# Patient Record
Sex: Female | Born: 1940
Health system: Southern US, Community
[De-identification: ages and names within clinical notes are randomized; demographics above are authoritative.]

## PROBLEM LIST (undated history)

## (undated) DIAGNOSIS — N6019 Diffuse cystic mastopathy of unspecified breast: Secondary | ICD-10-CM

## (undated) DIAGNOSIS — N189 Chronic kidney disease, unspecified: Secondary | ICD-10-CM

## (undated) DIAGNOSIS — I6529 Occlusion and stenosis of unspecified carotid artery: Secondary | ICD-10-CM

## (undated) DIAGNOSIS — F419 Anxiety disorder, unspecified: Secondary | ICD-10-CM

## (undated) DIAGNOSIS — M1990 Unspecified osteoarthritis, unspecified site: Secondary | ICD-10-CM

## (undated) DIAGNOSIS — G4700 Insomnia, unspecified: Secondary | ICD-10-CM

## (undated) DIAGNOSIS — I10 Essential (primary) hypertension: Secondary | ICD-10-CM

## (undated) DIAGNOSIS — C801 Malignant (primary) neoplasm, unspecified: Secondary | ICD-10-CM

## (undated) DIAGNOSIS — E039 Hypothyroidism, unspecified: Secondary | ICD-10-CM

## (undated) DIAGNOSIS — Z7189 Other specified counseling: Secondary | ICD-10-CM

## (undated) DIAGNOSIS — M4800 Spinal stenosis, site unspecified: Secondary | ICD-10-CM

## (undated) HISTORY — DX: Diffuse cystic mastopathy of unspecified breast: N60.19

## (undated) HISTORY — PX: EYE SURGERY: SHX253

## (undated) HISTORY — DX: Insomnia, unspecified: G47.00

## (undated) HISTORY — DX: Malignant (primary) neoplasm, unspecified: C80.1

## (undated) HISTORY — DX: Spinal stenosis, site unspecified: M48.00

## (undated) HISTORY — DX: Essential (primary) hypertension: I10

## (undated) HISTORY — PX: TUBAL LIGATION: SHX77

## (undated) HISTORY — PX: MELANOMA EXCISION: SHX5266

## (undated) HISTORY — DX: Other specified counseling: Z71.89

---

## 1995-03-07 DIAGNOSIS — C801 Malignant (primary) neoplasm, unspecified: Secondary | ICD-10-CM

## 1995-03-07 HISTORY — DX: Malignant (primary) neoplasm, unspecified: C80.1

## 1997-03-06 HISTORY — PX: ABDOMINAL HYSTERECTOMY: SHX81

## 1997-07-28 ENCOUNTER — Other Ambulatory Visit: Admission: RE | Admit: 1997-07-28 | Discharge: 1997-07-28 | Payer: Self-pay | Admitting: Obstetrics and Gynecology

## 1997-11-06 ENCOUNTER — Other Ambulatory Visit: Admission: RE | Admit: 1997-11-06 | Discharge: 1997-11-06 | Payer: Self-pay | Admitting: Obstetrics and Gynecology

## 1998-04-05 ENCOUNTER — Other Ambulatory Visit: Admission: RE | Admit: 1998-04-05 | Discharge: 1998-04-05 | Payer: Self-pay | Admitting: Obstetrics and Gynecology

## 1998-08-05 ENCOUNTER — Other Ambulatory Visit: Admission: RE | Admit: 1998-08-05 | Discharge: 1998-08-05 | Payer: Self-pay | Admitting: Obstetrics and Gynecology

## 1998-09-06 ENCOUNTER — Ambulatory Visit (HOSPITAL_COMMUNITY): Admission: RE | Admit: 1998-09-06 | Discharge: 1998-09-06 | Payer: Self-pay | Admitting: Gastroenterology

## 1998-09-06 ENCOUNTER — Encounter (INDEPENDENT_AMBULATORY_CARE_PROVIDER_SITE_OTHER): Payer: Self-pay | Admitting: Specialist

## 1998-12-09 ENCOUNTER — Other Ambulatory Visit: Admission: RE | Admit: 1998-12-09 | Discharge: 1998-12-09 | Payer: Self-pay | Admitting: Obstetrics and Gynecology

## 1999-05-05 ENCOUNTER — Other Ambulatory Visit: Admission: RE | Admit: 1999-05-05 | Discharge: 1999-05-05 | Payer: Self-pay | Admitting: Obstetrics and Gynecology

## 1999-11-03 ENCOUNTER — Other Ambulatory Visit: Admission: RE | Admit: 1999-11-03 | Discharge: 1999-11-03 | Payer: Self-pay | Admitting: Obstetrics and Gynecology

## 2000-05-03 ENCOUNTER — Other Ambulatory Visit: Admission: RE | Admit: 2000-05-03 | Discharge: 2000-05-03 | Payer: Self-pay | Admitting: Obstetrics and Gynecology

## 2000-11-01 ENCOUNTER — Other Ambulatory Visit: Admission: RE | Admit: 2000-11-01 | Discharge: 2000-11-01 | Payer: Self-pay | Admitting: Obstetrics and Gynecology

## 2001-05-23 ENCOUNTER — Other Ambulatory Visit: Admission: RE | Admit: 2001-05-23 | Discharge: 2001-05-23 | Payer: Self-pay | Admitting: Obstetrics and Gynecology

## 2001-11-08 ENCOUNTER — Other Ambulatory Visit: Admission: RE | Admit: 2001-11-08 | Discharge: 2001-11-08 | Payer: Self-pay | Admitting: Obstetrics and Gynecology

## 2002-05-29 ENCOUNTER — Other Ambulatory Visit: Admission: RE | Admit: 2002-05-29 | Discharge: 2002-05-29 | Payer: Self-pay | Admitting: Obstetrics and Gynecology

## 2003-05-07 ENCOUNTER — Encounter: Admission: RE | Admit: 2003-05-07 | Discharge: 2003-05-07 | Payer: Self-pay | Admitting: Rheumatology

## 2003-06-11 ENCOUNTER — Other Ambulatory Visit: Admission: RE | Admit: 2003-06-11 | Discharge: 2003-06-11 | Payer: Self-pay | Admitting: Obstetrics and Gynecology

## 2003-10-29 ENCOUNTER — Encounter: Admission: RE | Admit: 2003-10-29 | Discharge: 2003-10-29 | Payer: Self-pay | Admitting: Internal Medicine

## 2003-12-17 ENCOUNTER — Ambulatory Visit (HOSPITAL_COMMUNITY): Admission: RE | Admit: 2003-12-17 | Discharge: 2003-12-17 | Payer: Self-pay | Admitting: Gastroenterology

## 2004-10-27 ENCOUNTER — Other Ambulatory Visit: Admission: RE | Admit: 2004-10-27 | Discharge: 2004-10-27 | Payer: Self-pay | Admitting: Internal Medicine

## 2005-11-02 ENCOUNTER — Other Ambulatory Visit: Admission: RE | Admit: 2005-11-02 | Discharge: 2005-11-02 | Payer: Self-pay | Admitting: Internal Medicine

## 2006-11-08 ENCOUNTER — Other Ambulatory Visit: Admission: RE | Admit: 2006-11-08 | Discharge: 2006-11-08 | Payer: Self-pay | Admitting: Internal Medicine

## 2007-11-07 ENCOUNTER — Other Ambulatory Visit: Admission: RE | Admit: 2007-11-07 | Discharge: 2007-11-07 | Payer: Self-pay | Admitting: Internal Medicine

## 2008-03-02 ENCOUNTER — Ambulatory Visit: Payer: Self-pay | Admitting: Internal Medicine

## 2008-05-05 ENCOUNTER — Ambulatory Visit: Payer: Self-pay | Admitting: Internal Medicine

## 2008-06-04 ENCOUNTER — Ambulatory Visit: Payer: Self-pay | Admitting: Internal Medicine

## 2008-06-23 ENCOUNTER — Ambulatory Visit: Payer: Self-pay | Admitting: Internal Medicine

## 2008-06-30 ENCOUNTER — Ambulatory Visit: Payer: Self-pay | Admitting: Internal Medicine

## 2008-07-03 ENCOUNTER — Ambulatory Visit: Payer: Self-pay | Admitting: Internal Medicine

## 2008-07-24 ENCOUNTER — Ambulatory Visit: Payer: Self-pay | Admitting: Internal Medicine

## 2008-08-13 ENCOUNTER — Ambulatory Visit: Payer: Self-pay | Admitting: Internal Medicine

## 2008-11-12 ENCOUNTER — Other Ambulatory Visit: Admission: RE | Admit: 2008-11-12 | Discharge: 2008-11-12 | Payer: Self-pay | Admitting: Internal Medicine

## 2008-11-12 ENCOUNTER — Ambulatory Visit: Payer: Self-pay | Admitting: Internal Medicine

## 2008-12-08 ENCOUNTER — Encounter: Admission: RE | Admit: 2008-12-08 | Discharge: 2008-12-08 | Payer: Self-pay | Admitting: Internal Medicine

## 2008-12-08 ENCOUNTER — Ambulatory Visit: Payer: Self-pay | Admitting: Internal Medicine

## 2009-05-11 ENCOUNTER — Ambulatory Visit: Payer: Self-pay | Admitting: Internal Medicine

## 2009-08-19 ENCOUNTER — Ambulatory Visit: Payer: Self-pay | Admitting: Internal Medicine

## 2009-08-24 ENCOUNTER — Ambulatory Visit: Payer: Self-pay | Admitting: Internal Medicine

## 2009-08-30 ENCOUNTER — Ambulatory Visit: Payer: Self-pay | Admitting: Internal Medicine

## 2009-09-13 ENCOUNTER — Encounter: Admission: RE | Admit: 2009-09-13 | Discharge: 2009-09-13 | Payer: Self-pay | Admitting: Internal Medicine

## 2009-09-14 ENCOUNTER — Ambulatory Visit: Payer: Self-pay | Admitting: Internal Medicine

## 2009-10-01 ENCOUNTER — Encounter: Admission: RE | Admit: 2009-10-01 | Discharge: 2009-10-01 | Payer: Self-pay | Admitting: Otolaryngology

## 2009-11-29 ENCOUNTER — Encounter: Admission: RE | Admit: 2009-11-29 | Discharge: 2009-11-29 | Payer: Self-pay | Admitting: Internal Medicine

## 2009-11-29 ENCOUNTER — Ambulatory Visit: Payer: Self-pay | Admitting: Internal Medicine

## 2010-05-30 ENCOUNTER — Ambulatory Visit: Payer: Self-pay | Admitting: Internal Medicine

## 2010-05-30 ENCOUNTER — Ambulatory Visit (INDEPENDENT_AMBULATORY_CARE_PROVIDER_SITE_OTHER): Payer: Medicare Other | Admitting: Internal Medicine

## 2010-05-30 DIAGNOSIS — I10 Essential (primary) hypertension: Secondary | ICD-10-CM

## 2010-05-30 DIAGNOSIS — E785 Hyperlipidemia, unspecified: Secondary | ICD-10-CM

## 2010-06-02 ENCOUNTER — Other Ambulatory Visit: Payer: Medicare Other | Admitting: Internal Medicine

## 2010-07-22 NOTE — Op Note (Signed)
Melinda Payne, BRILEY            ACCOUNT NO.:  [id number]   MEDICAL RECORD NO.:  [id number]          PATIENT TYPE:  AMB   LOCATION:  ENDO                         FACILITY:  [facility]   PHYSICIAN:  Bernette Redbird, M.D.   DATE OF BIRTH:  [birth year]   DATE OF PROCEDURE:  12/17/2003  DATE OF DISCHARGE:                                 OPERATIVE REPORT   PROCEDURE:  Colonoscopy.   INDICATIONS:  A 70 year old female with a prior history of a diminutive  solitary adenomatous polyp having been removed colonoscopically five years  ago.   FINDINGS:  Normal exam except rare right-sided diverticulum.   PROCEDURE:  The nature, purpose, and risks of the procedure were familiar to  the patient from prior examination.  She provided written consent.  Sedation  was fentanyl 70 mcg and Versed 7 mg IV without arrhythmias or desaturation.  The Olympus adjustable-tension pediatric video colonoscope was advanced with  slight difficulty due to looping.  I was able to enter the terminal ileum  for a short distance and it appeared normal, and pullback was then  performed.  The quality of the prep was excellent, and it is felt that all  areas were well-seen.   There was a rare right-sided diverticulum but no polyps, cancer, colitis, or  vascular malformations were observed and retroflexion in the rectum and  reinspection in the rectum were unremarkable.  No biopsies were obtained.  The patient tolerated the procedure well, and there were no apparent  complications.   IMPRESSION:  1.  Prior history of colonic adenoma, without recurrent polyps on current      examination (V12.72).  2.  Minimal right-side diverticulosis.   PLAN:  Follow up colonoscopy in five years.       RB/MEDQ  D:  12/17/2003  T:  12/17/2003  Job:  [id number]   cc:   Luanna Cole. Lenord Fellers, M.D.  [address], [place]  [place]  [place] [address]  Fax: [phone]

## 2010-08-02 ENCOUNTER — Other Ambulatory Visit: Payer: Self-pay

## 2010-08-02 MED ORDER — ALPRAZOLAM 0.25 MG PO TABS: 0.2500 mg | ORAL_TABLET | Freq: Two times a day (BID) | ORAL | Status: DC | PRN

## 2010-11-10 ENCOUNTER — Telehealth: Payer: Self-pay | Admitting: *Deleted

## 2010-11-10 MED ORDER — TEMAZEPAM 30 MG PO CAPS: 15.0000 mg | ORAL_CAPSULE | Freq: Every evening | ORAL | Status: DC | PRN

## 2010-11-10 NOTE — Telephone Encounter (Signed)
Rx refill

## 2010-11-29 ENCOUNTER — Other Ambulatory Visit: Payer: Self-pay

## 2010-11-29 MED ORDER — ESTRADIOL 0.025 MG/24HR TD PTTW: 1.0000 | MEDICATED_PATCH | TRANSDERMAL | Status: DC

## 2010-12-06 ENCOUNTER — Ambulatory Visit (INDEPENDENT_AMBULATORY_CARE_PROVIDER_SITE_OTHER): Payer: Medicare Other | Admitting: Internal Medicine

## 2010-12-06 ENCOUNTER — Encounter: Payer: Self-pay | Admitting: Internal Medicine

## 2010-12-06 VITALS — BP 116/62 | HR 66 | Temp 97.4°F | Ht 61.0 in | Wt 132.0 lb

## 2010-12-06 DIAGNOSIS — M48061 Spinal stenosis, lumbar region without neurogenic claudication: Secondary | ICD-10-CM

## 2010-12-06 DIAGNOSIS — Z8542 Personal history of malignant neoplasm of other parts of uterus: Secondary | ICD-10-CM

## 2010-12-06 DIAGNOSIS — N6019 Diffuse cystic mastopathy of unspecified breast: Secondary | ICD-10-CM

## 2010-12-06 DIAGNOSIS — Z0000 Encounter for general adult medical examination without abnormal findings: Secondary | ICD-10-CM

## 2010-12-06 DIAGNOSIS — G4700 Insomnia, unspecified: Secondary | ICD-10-CM

## 2010-12-06 DIAGNOSIS — Z23 Encounter for immunization: Secondary | ICD-10-CM

## 2010-12-06 DIAGNOSIS — I10 Essential (primary) hypertension: Secondary | ICD-10-CM

## 2010-12-06 DIAGNOSIS — F411 Generalized anxiety disorder: Secondary | ICD-10-CM

## 2010-12-06 DIAGNOSIS — Z85820 Personal history of malignant melanoma of skin: Secondary | ICD-10-CM

## 2010-12-06 DIAGNOSIS — E559 Vitamin D deficiency, unspecified: Secondary | ICD-10-CM

## 2010-12-06 DIAGNOSIS — F419 Anxiety disorder, unspecified: Secondary | ICD-10-CM

## 2010-12-06 LAB — POCT URINALYSIS DIPSTICK
Bilirubin, UA: NEGATIVE
Blood, UA: NEGATIVE
Glucose, UA: NEGATIVE
Ketones, UA: NEGATIVE
Leukocytes, UA: NEGATIVE
Nitrite, UA: NEGATIVE
pH, UA: 5

## 2010-12-07 LAB — LIPID PANEL
Cholesterol: 188 mg/dL (ref 0–200)
Triglycerides: 40 mg/dL (ref <150)
VLDL: 8 mg/dL (ref 0–40)

## 2010-12-07 LAB — BASIC METABOLIC PANEL
BUN: 24 mg/dL — ABNORMAL HIGH (ref 6–23)
Calcium: 9.7 mg/dL (ref 8.4–10.5)
Creat: 1.4 mg/dL — ABNORMAL HIGH (ref 0.50–1.10)
Glucose, Bld: 90 mg/dL (ref 70–99)

## 2010-12-07 LAB — HEPATIC FUNCTION PANEL
ALT: 11 U/L (ref 0–35)
Alkaline Phosphatase: 59 U/L (ref 39–117)
Bilirubin, Direct: 0.1 mg/dL (ref 0.0–0.3)
Indirect Bilirubin: 0.4 mg/dL (ref 0.0–0.9)
Total Protein: 6.7 g/dL (ref 6.0–8.3)

## 2010-12-07 LAB — VITAMIN D 25 HYDROXY (VIT D DEFICIENCY, FRACTURES): Vit D, 25-Hydroxy: 55 ng/mL (ref 30–89)

## 2010-12-07 LAB — CBC WITH DIFFERENTIAL/PLATELET
Basophils Absolute: 0 10*3/uL (ref 0.0–0.1)
Basophils Relative: 0 % (ref 0–1)
Eosinophils Absolute: 0.1 10*3/uL (ref 0.0–0.7)
Eosinophils Relative: 2 % (ref 0–5)
HCT: 34.7 % — ABNORMAL LOW (ref 36.0–46.0)
Hemoglobin: 11.1 g/dL — ABNORMAL LOW (ref 12.0–15.0)
MCH: 30.7 pg (ref 26.0–34.0)
MCHC: 32 g/dL (ref 30.0–36.0)
Monocytes Absolute: 0.4 10*3/uL (ref 0.1–1.0)
Monocytes Relative: 7 % (ref 3–12)
Neutro Abs: 2.9 10*3/uL (ref 1.7–7.7)
RDW: 12.8 % (ref 11.5–15.5)

## 2010-12-15 ENCOUNTER — Other Ambulatory Visit: Payer: Self-pay | Admitting: *Deleted

## 2010-12-15 MED ORDER — SIMVASTATIN 10 MG PO TABS: 10.0000 mg | ORAL_TABLET | Freq: Every day | ORAL | Status: DC

## 2010-12-15 MED ORDER — AMLODIPINE BESYLATE 5 MG PO TABS: 5.0000 mg | ORAL_TABLET | Freq: Every day | ORAL | Status: DC

## 2010-12-16 ENCOUNTER — Telehealth: Payer: Self-pay | Admitting: *Deleted

## 2010-12-16 NOTE — Telephone Encounter (Signed)
Needs return visit next week to discuss some lab results and can address then.

## 2010-12-16 NOTE — Telephone Encounter (Signed)
Patient had her CPE last week.  She usually gets a chest X-Ray every year.  Wants to know if one needs ordered.

## 2010-12-16 NOTE — Telephone Encounter (Signed)
Appt scheduled for Monday, 12/19/10 at 3:30.

## 2010-12-19 ENCOUNTER — Encounter: Payer: Self-pay | Admitting: Internal Medicine

## 2010-12-19 ENCOUNTER — Ambulatory Visit (INDEPENDENT_AMBULATORY_CARE_PROVIDER_SITE_OTHER): Payer: Medicare Other | Admitting: Internal Medicine

## 2010-12-19 VITALS — BP 118/64 | HR 68 | Temp 97.5°F | Wt 132.0 lb

## 2010-12-19 DIAGNOSIS — I10 Essential (primary) hypertension: Secondary | ICD-10-CM

## 2010-12-19 DIAGNOSIS — M48061 Spinal stenosis, lumbar region without neurogenic claudication: Secondary | ICD-10-CM

## 2010-12-19 DIAGNOSIS — E039 Hypothyroidism, unspecified: Secondary | ICD-10-CM

## 2010-12-19 DIAGNOSIS — R945 Abnormal results of liver function studies: Secondary | ICD-10-CM

## 2010-12-19 DIAGNOSIS — Z85820 Personal history of malignant melanoma of skin: Secondary | ICD-10-CM

## 2010-12-19 DIAGNOSIS — N181 Chronic kidney disease, stage 1: Secondary | ICD-10-CM

## 2010-12-20 ENCOUNTER — Ambulatory Visit
Admission: RE | Admit: 2010-12-20 | Discharge: 2010-12-20 | Disposition: A | Discharge status: Home / Self Care | Payer: Medicare Other | Source: Ambulatory Visit | Attending: Internal Medicine | Admitting: Internal Medicine

## 2010-12-20 DIAGNOSIS — Z85820 Personal history of malignant melanoma of skin: Secondary | ICD-10-CM

## 2010-12-20 LAB — BASIC METABOLIC PANEL
BUN: 27 mg/dL — ABNORMAL HIGH (ref 6–23)
CO2: 26 mEq/L (ref 19–32)
Chloride: 101 mEq/L (ref 96–112)
Potassium: 4.7 mEq/L (ref 3.5–5.3)

## 2010-12-21 ENCOUNTER — Encounter: Payer: Self-pay | Admitting: Internal Medicine

## 2011-01-02 ENCOUNTER — Other Ambulatory Visit: Payer: Medicare Other | Admitting: Internal Medicine

## 2011-01-04 DIAGNOSIS — E559 Vitamin D deficiency, unspecified: Secondary | ICD-10-CM | POA: Insufficient documentation

## 2011-01-04 DIAGNOSIS — G4700 Insomnia, unspecified: Secondary | ICD-10-CM | POA: Insufficient documentation

## 2011-01-04 DIAGNOSIS — I10 Essential (primary) hypertension: Secondary | ICD-10-CM | POA: Insufficient documentation

## 2011-01-04 DIAGNOSIS — N6019 Diffuse cystic mastopathy of unspecified breast: Secondary | ICD-10-CM | POA: Insufficient documentation

## 2011-01-04 DIAGNOSIS — Z8542 Personal history of malignant neoplasm of other parts of uterus: Secondary | ICD-10-CM | POA: Insufficient documentation

## 2011-01-04 DIAGNOSIS — Z85820 Personal history of malignant melanoma of skin: Secondary | ICD-10-CM | POA: Insufficient documentation

## 2011-01-04 DIAGNOSIS — M48061 Spinal stenosis, lumbar region without neurogenic claudication: Secondary | ICD-10-CM | POA: Insufficient documentation

## 2011-01-04 DIAGNOSIS — F419 Anxiety disorder, unspecified: Secondary | ICD-10-CM | POA: Insufficient documentation

## 2011-01-04 NOTE — Patient Instructions (Signed)
Continue with current medications. Return in 6 months for office visit, fasting lipid panel liver functions and blood pressure check.

## 2011-01-04 NOTE — Progress Notes (Signed)
  Subjective:    Patient ID: Melinda Payne, female    DOB: [birth year], 70 y.o.   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 Had Zostavax vaccine September 2011, tetanus toxoid immunization March 2000, gets annual influenza immunization. Pneumovax 2007.  Additional history: Tubal ligation 1978 or 1979. Hysteroscopy with D &C December 1998. Dysplastic compound nevus on her back 1998.    Review of Systems  Constitutional: Negative.   HENT: Negative.   Eyes: Negative.   Respiratory: Negative.   Cardiovascular: Negative.   Gastrointestinal: Negative.   Genitourinary: Negative.   Musculoskeletal: Positive for back pain.  Neurological: Negative.   Psychiatric/Behavioral:       Insomnia, anxiety       Objective:   Physical Exam  Vitals reviewed. Constitutional: She is oriented to person, place, and time. She appears well-developed and well-nourished.  HENT:  Head: Normocephalic and atraumatic.  Right Ear: External ear normal.  Left Ear: External ear normal.  Mouth/Throat: Oropharynx is clear and moist.  Eyes: EOM are normal. Pupils are equal, round, and reactive to light. No scleral icterus.  Neck: No JVD present. No thyromegaly present.  Cardiovascular: Normal rate, regular rhythm and normal heart sounds.   Pulmonary/Chest: Effort normal and breath sounds normal.       Breasts normal female  Abdominal: Soft. Bowel sounds are normal. She exhibits no mass. There is no tenderness.  Genitourinary:       Deferred  Musculoskeletal: She exhibits no edema.  Lymphadenopathy:    She has no cervical adenopathy.  Neurological: She is alert and oriented to person, place, and time. She has normal reflexes. No cranial nerve deficit.  Skin: Skin is warm  and dry. No rash noted.  Psychiatric: She has a normal mood and affect. Her behavior is normal. Thought content normal.          Assessment & Plan:  Hypertension  Hyperlipidemia  History of vitamin D deficiency  History of lumbar spinal stenosis  History of insomnia  History of fibrocystic breast disease  History of melanoma left neck  Status post TAH/BSO for endometrial cancer 1999  Plan: Patient had screening colonoscopy 2011. She will return in 6 months for office visit and blood pressure check, lipid panel liver functions on statin medication. Had bone density study September 2010. Had tetanus updated health Department 2009. Chest x-ray negative September 2011. Mammogram normal October 2011.

## 2011-01-08 NOTE — Progress Notes (Signed)
  Subjective:    Patient ID: Melinda Payne, female    DOB: [birth year], 70 y.o.   MRN: [id number]  HPI had patient come back to discuss lab work from 12/06/2010. TSH is elevated . Looks like she is becoming hypothyroid. Hemoglobin is stable at 11.1 g. Have been following creatinine for while. If she is fasting it tends to be a bit higher at 1.34. If she is nonfasting it tends to be better. Likely has an element of chronic kidney disease. History of melanoma and is going to have chest x-ray. History of lumbar spinal stenosis. Enalapril needs to be discontinued with history of elevated creatinine. Would like to repeat basic metabolic panel  after being off of enalapril in 4 weeks.    Review of Systems     Objective:   Physical Exam chest clear; cardiac exam regular rate and rhythm; extremities without edema        Assessment & Plan:  Elevated creatinine-? Chronic kidney disease. Patient on ACE inhibitor which is being discontinued with plans to recheck basic metabolic panel off that in 4 weeks  Lumbar spinal stenosis  History of anemia-stable  Hypothyroidism-new diagnosis  Plan: Return in 4 weeks for b- met & office visit.

## 2011-01-09 ENCOUNTER — Ambulatory Visit: Payer: Medicare Other | Admitting: Internal Medicine

## 2011-01-09 DIAGNOSIS — E039 Hypothyroidism, unspecified: Secondary | ICD-10-CM | POA: Insufficient documentation

## 2011-01-09 NOTE — Patient Instructions (Signed)
Discontinue enalapril. Return in 4 weeks to repeat basic metabolic panel and followup of elevated creatinine.

## 2011-01-30 ENCOUNTER — Other Ambulatory Visit: Payer: Self-pay

## 2011-02-02 ENCOUNTER — Other Ambulatory Visit: Payer: Medicare Other | Admitting: Internal Medicine

## 2011-02-02 DIAGNOSIS — E039 Hypothyroidism, unspecified: Secondary | ICD-10-CM

## 2011-02-02 DIAGNOSIS — I10 Essential (primary) hypertension: Secondary | ICD-10-CM

## 2011-02-02 LAB — BASIC METABOLIC PANEL
BUN: 18 mg/dL (ref 6–23)
CO2: 30 mEq/L (ref 19–32)
Glucose, Bld: 83 mg/dL (ref 70–99)
Potassium: 4.5 mEq/L (ref 3.5–5.3)
Sodium: 140 mEq/L (ref 135–145)

## 2011-02-03 ENCOUNTER — Ambulatory Visit (INDEPENDENT_AMBULATORY_CARE_PROVIDER_SITE_OTHER): Payer: Medicare Other | Admitting: Internal Medicine

## 2011-02-03 ENCOUNTER — Encounter: Payer: Self-pay | Admitting: Internal Medicine

## 2011-02-03 VITALS — BP 166/78 | HR 80 | Temp 97.2°F | Wt 137.0 lb

## 2011-02-03 DIAGNOSIS — E039 Hypothyroidism, unspecified: Secondary | ICD-10-CM

## 2011-02-03 DIAGNOSIS — I10 Essential (primary) hypertension: Secondary | ICD-10-CM

## 2011-02-03 DIAGNOSIS — R799 Abnormal finding of blood chemistry, unspecified: Secondary | ICD-10-CM

## 2011-02-03 DIAGNOSIS — R7989 Other specified abnormal findings of blood chemistry: Secondary | ICD-10-CM

## 2011-02-05 NOTE — Patient Instructions (Signed)
Restart HCTZ 25 mg daily. Increase amlodipine to 10 mg daily. Return in 3 weeks. Increase Synthroid to 0.075 mg daily and recheck TSH in 3 months.

## 2011-02-05 NOTE — Progress Notes (Signed)
  Subjective:    Patient ID: Melinda Payne, female    DOB: 1940-04-14, 70 y.o.   MRN: [id number]  HPI for followup today of hypothyroidism and hypertension. We recently discontinued her ACE inhibitor because of elevated creatinine. Be that has been repeated and has improved. However her blood pressure is now  elevated systolically. TSH was checked recently and has improved on Synthroid 0.05 mg daily. Would like TSH to be about 1.00. HCTZ was also discontinued at last visit. Has noticed a little bit of foot swelling.    Review of Systems     Objective:   Physical Exam chest clear; cardiac exam regular rate and rhythm; no pitting edema on lower extremities.        Assessment & Plan:  Hypothyroidism  Hypertension  Plan: Will increase TSH 0.075 mg and recheck TSH in 3 months. Restart HCTZ 25 mg daily and increase amlodipine to 10 mg daily. Return in 3 weeks.

## 2011-02-21 ENCOUNTER — Encounter: Payer: Self-pay | Admitting: Internal Medicine

## 2011-02-21 ENCOUNTER — Ambulatory Visit (INDEPENDENT_AMBULATORY_CARE_PROVIDER_SITE_OTHER): Payer: Medicare Other | Admitting: Internal Medicine

## 2011-02-21 VITALS — BP 116/64 | HR 80 | Temp 98.0°F | Wt 135.0 lb

## 2011-02-21 DIAGNOSIS — I10 Essential (primary) hypertension: Secondary | ICD-10-CM

## 2011-02-21 DIAGNOSIS — R609 Edema, unspecified: Secondary | ICD-10-CM

## 2011-02-21 LAB — POTASSIUM: Potassium: 4.1 mEq/L (ref 3.5–5.3)

## 2011-02-21 NOTE — Patient Instructions (Signed)
Decrease amlodipine to 5 mg daily from 10 mg daily. Written prescription for #30 with 2 refills. Continue HCTZ 25 mg daily. Recheck in 4 weeks.

## 2011-02-21 NOTE — Progress Notes (Signed)
  Subjective:    Patient ID: Melinda Payne, female    DOB: 10-Sep-1940, 70 y.o.   MRN: [id number]  HPI Patient here today to followup on hypertension. Recently we had to stop her ACE inhibitor because of elevated creatinine which improved after ACE was stopped. At her last visit at the end of November, we started her on HCTZ and increased amlodipine to 10 mg daily to gain better blood pressure control off ACE inhibitor. She now has dependent edema which is likely secondary to increased dose of amlodipine. She is complaining bitterly of feet swelling tickly by the end of the day.    Review of Systems     Objective:   Physical Exam chest clear; cardiac exam regular rate and rhythm; nonpitting edema 1+ lower extremities        Assessment & Plan:  Dependent edema-secondary to amlodipine increased dose  Hypertension  Plan: Decrease amlodipine 5 mg daily. Continue HCTZ 25 mg daily. Serum potassium checked today. Return in 4 weeks for reevaluation. Blood pressure today is excellent

## 2011-02-27 ENCOUNTER — Encounter: Payer: Self-pay | Admitting: Internal Medicine

## 2011-03-14 ENCOUNTER — Other Ambulatory Visit: Payer: Self-pay

## 2011-03-14 MED ORDER — TEMAZEPAM 15 MG PO CAPS: 15.0000 mg | ORAL_CAPSULE | Freq: Every evening | ORAL | Status: DC | PRN

## 2011-03-24 ENCOUNTER — Encounter: Payer: Self-pay | Admitting: Internal Medicine

## 2011-03-24 ENCOUNTER — Ambulatory Visit (INDEPENDENT_AMBULATORY_CARE_PROVIDER_SITE_OTHER): Payer: Medicare Other | Admitting: Internal Medicine

## 2011-03-24 VITALS — BP 140/80 | HR 76 | Temp 97.5°F | Wt 136.5 lb

## 2011-03-24 DIAGNOSIS — I10 Essential (primary) hypertension: Secondary | ICD-10-CM

## 2011-03-24 DIAGNOSIS — F411 Generalized anxiety disorder: Secondary | ICD-10-CM

## 2011-03-24 DIAGNOSIS — M899 Disorder of bone, unspecified: Secondary | ICD-10-CM

## 2011-03-24 DIAGNOSIS — M8580 Other specified disorders of bone density and structure, unspecified site: Secondary | ICD-10-CM

## 2011-03-24 DIAGNOSIS — F4321 Adjustment disorder with depressed mood: Secondary | ICD-10-CM

## 2011-03-24 DIAGNOSIS — F419 Anxiety disorder, unspecified: Secondary | ICD-10-CM

## 2011-03-24 NOTE — Patient Instructions (Signed)
Continue same medications  Return in 3 months

## 2011-03-24 NOTE — Progress Notes (Signed)
  Subjective:    Patient ID: Melinda Payne, female    DOB: May 28, 1940, 71 y.o.   MRN: [id number]  HPI in today to followup on hypertension. Had developed a renal insufficiency on ACE inhibitor and we have had to make a change in her antihypertensive medications. She is a bit anxious today. She lost a brother to dementia and a fall resulting in pneumothorax just recently. She has a strong family history of dementia. Now has 3 siblings that have developed dementia and may have a fourth sibling developing it. 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 This is been very hard for her. Patient has Xanax to take sparingly for anxiety. History of mild anemia. BUN and creatinine improved with medication change. Recently had bone density study December 2012. Has mild osteopenia. Recommend calcium and vitamin D. Repeat study in 2 years. Review results with her today.    Review of Systems     Objective:   Physical Exam chest clear to auscultation; cardiac exam regular rate and rhythm; extremities without edema. Talked at length about current situation. Denies being depressed.        Assessment & Plan:  Grief reaction due to loss of brother  Anxiety  Hypertension  Osteopenia  Plan: Return in 3 months for repeat office visit, blood pressure check, followup on anemia with CBC, followup on kidney functions with b- met.

## 2011-04-27 ENCOUNTER — Other Ambulatory Visit: Payer: Self-pay

## 2011-04-27 MED ORDER — HYDROCHLOROTHIAZIDE 25 MG PO TABS: 25.0000 mg | ORAL_TABLET | Freq: Every day | ORAL | Status: DC

## 2011-06-01 ENCOUNTER — Other Ambulatory Visit: Payer: Self-pay

## 2011-06-01 MED ORDER — LEVOTHYROXINE SODIUM 75 MCG PO TABS: 75.0000 ug | ORAL_TABLET | Freq: Every day | ORAL | Status: DC

## 2011-06-20 ENCOUNTER — Ambulatory Visit (INDEPENDENT_AMBULATORY_CARE_PROVIDER_SITE_OTHER): Payer: Medicare Other | Admitting: Internal Medicine

## 2011-06-20 ENCOUNTER — Other Ambulatory Visit: Payer: Medicare Other | Admitting: Internal Medicine

## 2011-06-20 ENCOUNTER — Encounter: Payer: Self-pay | Admitting: Internal Medicine

## 2011-06-20 VITALS — BP 124/62 | HR 68 | Resp 16 | Ht 62.0 in | Wt 136.0 lb

## 2011-06-20 DIAGNOSIS — F419 Anxiety disorder, unspecified: Secondary | ICD-10-CM

## 2011-06-20 DIAGNOSIS — E785 Hyperlipidemia, unspecified: Secondary | ICD-10-CM

## 2011-06-20 DIAGNOSIS — I10 Essential (primary) hypertension: Secondary | ICD-10-CM

## 2011-06-20 DIAGNOSIS — R0989 Other specified symptoms and signs involving the circulatory and respiratory systems: Secondary | ICD-10-CM

## 2011-06-20 DIAGNOSIS — Z79899 Other long term (current) drug therapy: Secondary | ICD-10-CM

## 2011-06-20 DIAGNOSIS — N289 Disorder of kidney and ureter, unspecified: Secondary | ICD-10-CM

## 2011-06-20 DIAGNOSIS — N181 Chronic kidney disease, stage 1: Secondary | ICD-10-CM

## 2011-06-20 DIAGNOSIS — E039 Hypothyroidism, unspecified: Secondary | ICD-10-CM

## 2011-06-20 DIAGNOSIS — F411 Generalized anxiety disorder: Secondary | ICD-10-CM

## 2011-06-20 NOTE — Progress Notes (Addendum)
  Subjective:    Patient ID: Melinda Payne, female    DOB: 25-May-1940, 71 y.o.   MRN: [id number]  HPI For follow up on Hypertension, hypothyroidism, hyperlipidemia, anxiety, strong family history of dementia in one sister and one brother and possibly a second sister as well.  Blood pressure under excellent control. History of lumbar spinal stenosis and Vitamin D deficiency. Taking 1000 Units daily of vitamin D 3. History of chronic kidney disease stage I    Review of Systems     Objective:   Physical Exam neck supple without thyromegaly . Asymptomatic right carotid bruit. chest clear to auscultation; cardiac exam regular rate and rhythm extremities without edema        Assessment & Plan:  Hypertension  Hypothyroidism  Anxiety  History of melanoma  Chronic kidney disease stage I  Asymptomatic right carotid bruit  Plan: Patient to return in 6 months for physical examination. Continue same medications.

## 2011-06-21 LAB — HEPATIC FUNCTION PANEL
Albumin: 4.7 g/dL (ref 3.5–5.2)
Bilirubin, Direct: 0.2 mg/dL (ref 0.0–0.3)
Total Bilirubin: 0.7 mg/dL (ref 0.3–1.2)

## 2011-06-21 LAB — LIPID PANEL
HDL: 96 mg/dL (ref >39)
LDL Cholesterol: 96 mg/dL (ref 0–99)
Total CHOL/HDL Ratio: 2.1 Ratio

## 2011-06-21 LAB — BASIC METABOLIC PANEL
CO2: 31 mEq/L (ref 19–32)
Chloride: 102 mEq/L (ref 96–112)
Glucose, Bld: 78 mg/dL (ref 70–99)
Sodium: 141 mEq/L (ref 135–145)

## 2011-06-28 ENCOUNTER — Other Ambulatory Visit: Payer: Self-pay

## 2011-06-28 MED ORDER — AMLODIPINE BESYLATE 5 MG PO TABS: 5.0000 mg | ORAL_TABLET | Freq: Every day | ORAL | Status: DC

## 2011-07-04 DIAGNOSIS — R0989 Other specified symptoms and signs involving the circulatory and respiratory systems: Secondary | ICD-10-CM | POA: Insufficient documentation

## 2011-07-04 NOTE — Patient Instructions (Signed)
Continue same medications and return in 6 months for physical examination. 

## 2011-07-24 ENCOUNTER — Other Ambulatory Visit: Payer: Self-pay

## 2011-07-24 MED ORDER — ALPRAZOLAM 0.25 MG PO TABS: 0.2500 mg | ORAL_TABLET | Freq: Two times a day (BID) | ORAL | Status: DC | PRN

## 2011-12-05 ENCOUNTER — Other Ambulatory Visit: Payer: Self-pay

## 2011-12-05 MED ORDER — TEMAZEPAM 15 MG PO CAPS: 15.0000 mg | ORAL_CAPSULE | Freq: Every evening | ORAL | Status: DC | PRN

## 2011-12-12 ENCOUNTER — Encounter: Payer: Self-pay | Admitting: Internal Medicine

## 2011-12-12 ENCOUNTER — Ambulatory Visit (INDEPENDENT_AMBULATORY_CARE_PROVIDER_SITE_OTHER): Payer: Medicare Other | Admitting: Internal Medicine

## 2011-12-12 ENCOUNTER — Other Ambulatory Visit: Payer: Medicare Other | Admitting: Internal Medicine

## 2011-12-12 VITALS — BP 154/74 | HR 68 | Temp 97.9°F | Ht 61.5 in | Wt 136.0 lb

## 2011-12-12 DIAGNOSIS — F411 Generalized anxiety disorder: Secondary | ICD-10-CM

## 2011-12-12 DIAGNOSIS — E559 Vitamin D deficiency, unspecified: Secondary | ICD-10-CM

## 2011-12-12 DIAGNOSIS — E039 Hypothyroidism, unspecified: Secondary | ICD-10-CM

## 2011-12-12 DIAGNOSIS — R0989 Other specified symptoms and signs involving the circulatory and respiratory systems: Secondary | ICD-10-CM

## 2011-12-12 DIAGNOSIS — I10 Essential (primary) hypertension: Secondary | ICD-10-CM

## 2011-12-12 DIAGNOSIS — Z23 Encounter for immunization: Secondary | ICD-10-CM

## 2011-12-12 DIAGNOSIS — E785 Hyperlipidemia, unspecified: Secondary | ICD-10-CM

## 2011-12-12 LAB — POCT URINALYSIS DIPSTICK
Bilirubin, UA: NEGATIVE
Blood, UA: NEGATIVE
Glucose, UA: NEGATIVE
Ketones, UA: NEGATIVE
Nitrite, UA: NEGATIVE
Spec Grav, UA: 1.01

## 2011-12-12 LAB — COMPREHENSIVE METABOLIC PANEL
BUN: 21 mg/dL (ref 6–23)
CO2: 31 mEq/L (ref 19–32)
Calcium: 9.6 mg/dL (ref 8.4–10.5)
Chloride: 101 mEq/L (ref 96–112)
Creat: 1.26 mg/dL — ABNORMAL HIGH (ref 0.50–1.10)
Total Bilirubin: 0.7 mg/dL (ref 0.3–1.2)

## 2011-12-12 LAB — CBC WITH DIFFERENTIAL/PLATELET
Eosinophils Absolute: 0.1 10*3/uL (ref 0.0–0.7)
Eosinophils Relative: 2 % (ref 0–5)
HCT: 36.5 % (ref 36.0–46.0)
Lymphocytes Relative: 38 % (ref 12–46)
Lymphs Abs: 2.3 10*3/uL (ref 0.7–4.0)
MCH: 30.1 pg (ref 26.0–34.0)
MCV: 90.1 fL (ref 78.0–100.0)
Monocytes Absolute: 0.4 10*3/uL (ref 0.1–1.0)
RBC: 4.05 MIL/uL (ref 3.87–5.11)
RDW: 13.2 % (ref 11.5–15.5)
WBC: 6.2 10*3/uL (ref 4.0–10.5)

## 2011-12-12 LAB — LIPID PANEL
Cholesterol: 196 mg/dL (ref 0–200)
HDL: 86 mg/dL (ref >39)
Total CHOL/HDL Ratio: 2.3 Ratio
Triglycerides: 46 mg/dL (ref <150)
VLDL: 9 mg/dL (ref 0–40)

## 2011-12-13 DIAGNOSIS — Z23 Encounter for immunization: Secondary | ICD-10-CM

## 2011-12-19 ENCOUNTER — Other Ambulatory Visit: Payer: Self-pay | Admitting: Internal Medicine

## 2012-03-07 ENCOUNTER — Other Ambulatory Visit: Payer: Self-pay

## 2012-03-07 MED ORDER — LEVOTHYROXINE SODIUM 75 MCG PO TABS: 75.0000 ug | ORAL_TABLET | Freq: Every day | ORAL | Status: DC

## 2012-04-02 ENCOUNTER — Telehealth: Payer: Self-pay | Admitting: Internal Medicine

## 2012-04-02 NOTE — Telephone Encounter (Signed)
Patient informed. 

## 2012-04-02 NOTE — Telephone Encounter (Signed)
Please ask her just to stop it .

## 2012-05-29 NOTE — Progress Notes (Signed)
  Subjective:    Patient ID: Melinda Payne, female    DOB: 01/11/1941, 72 y.o.   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 History of melanoma left neck, insomnia, anxiety, lumbar spinal stenosis, vitamin D deficiency, hypertension, hyperlipidemia, right carotid bruit, chronic kidney disease stage I.  Had dysplastic compound nevus removed from her back in 1998. Herniated disc L5 1998. Tubal ligation late 1970s. Melanoma removed from left neck 1997. Hysterectomy D&C 1998. Total abdominal hysterectomy BSO for endometrial carcinoma 1999  Colonoscopy March 2011. Pneumovax immunization 2007. Tetanus immunization 2009. Bone density study September 2010.  Intolerant of penicillin that causes a rash. Codeine causes a rash but she can take Tussionex. Avelox causes hallucinations.  Social history: Father died at age 37 with cirrhosis of the liver. One sister with history of scleroderma and Raynaud's disease died from scleroderma. One sister with history of cervical cancer. A couple of sisters with Alzheimer's disease.  Social history: Does not smoke or consume alcohol. Her only son female age 48 was killed in a car accident in the late 54s. She had considerable grief with that. She is married.  We've been following her for an elevated serum creatinine. It has been as high as 1.40 in October 2012 but improved to 1.15 in November 2012.    Review of Systems  Constitutional: Negative.   HENT: Negative.   Eyes: Negative.   Respiratory: Negative.   Cardiovascular: Negative.   Gastrointestinal: Negative.   Endocrine: Negative.   Allergic/Immunologic: Negative.   Neurological: Negative.   Hematological: Negative.   Psychiatric/Behavioral:       History of anxiety       Objective:   Physical Exam  Vitals reviewed. Constitutional: She is oriented to person, place, and time. She appears  well-developed and well-nourished. No distress.  HENT:  Head: Normocephalic and atraumatic.  Right Ear: External ear normal.  Left Ear: External ear normal.  Mouth/Throat: Oropharynx is clear and moist. No oropharyngeal exudate.  Neck: Normal range of motion. Neck supple. No JVD present. No thyromegaly present.  Asymptomatic right carotid bruit  Cardiovascular: Normal rate, regular rhythm, normal heart sounds and intact distal pulses.   No murmur heard. Pulmonary/Chest: No respiratory distress. She has no rales. She exhibits no tenderness.  Breasts normal female  Abdominal: Soft. Bowel sounds are normal. She exhibits no distension and no mass. There is no tenderness. There is no rebound and no guarding.  Genitourinary:  Status post total abdominal hysterectomy BSO  Musculoskeletal: Normal range of motion. She exhibits no edema.  Lymphadenopathy:    She has no cervical adenopathy.  Neurological: She is oriented to person, place, and time. She has normal reflexes. No cranial nerve deficit.  Skin: Skin is warm and dry. No rash noted. She is not diaphoretic.  Psychiatric: She has a normal mood and affect. Her behavior is normal. Judgment and thought content normal.          Assessment & Plan:  Hypertension  Chronic kidney disease stage I  History of melanoma left neck  Insomnia  Anxiety  Lumbar spinal stenosis  Vitamin D deficiency  History of right carotid bruit asymptomatic  Hyperlipidemia  Plan: Continue same medications and return in 6 months. She did have typhoid immunization and health department 11/28/2011. Influenza immunization given. Watch blood pressure. It is elevated today. For anxiety prescribed Xanax 0.25 mg #60 with no refill

## 2012-06-04 ENCOUNTER — Other Ambulatory Visit: Payer: Self-pay

## 2012-06-04 MED ORDER — TEMAZEPAM 15 MG PO CAPS: 15.0000 mg | ORAL_CAPSULE | Freq: Every evening | ORAL | Status: DC | PRN

## 2012-07-08 ENCOUNTER — Other Ambulatory Visit: Payer: Self-pay

## 2012-07-08 MED ORDER — LEVOTHYROXINE SODIUM 75 MCG PO TABS: 75.0000 ug | ORAL_TABLET | Freq: Every day | ORAL | Status: DC

## 2012-07-17 ENCOUNTER — Other Ambulatory Visit: Payer: Self-pay

## 2012-07-17 MED ORDER — AMLODIPINE BESYLATE 5 MG PO TABS: 5.0000 mg | ORAL_TABLET | Freq: Every day | ORAL | Status: DC

## 2012-07-18 ENCOUNTER — Other Ambulatory Visit: Payer: Self-pay

## 2012-08-05 ENCOUNTER — Other Ambulatory Visit: Payer: Self-pay

## 2012-08-05 MED ORDER — HYDROCHLOROTHIAZIDE 25 MG PO TABS: 25.0000 mg | ORAL_TABLET | Freq: Every day | ORAL | Status: DC

## 2012-09-11 ENCOUNTER — Other Ambulatory Visit: Payer: Self-pay

## 2012-09-11 MED ORDER — SIMVASTATIN 10 MG PO TABS: 10.0000 mg | ORAL_TABLET | Freq: Every day | ORAL | Status: DC

## 2012-10-03 ENCOUNTER — Other Ambulatory Visit: Payer: Self-pay

## 2012-10-03 MED ORDER — ALPRAZOLAM 0.25 MG PO TABS: 0.2500 mg | ORAL_TABLET | Freq: Two times a day (BID) | ORAL | Status: DC | PRN

## 2012-12-04 ENCOUNTER — Other Ambulatory Visit: Payer: Self-pay

## 2012-12-04 MED ORDER — TEMAZEPAM 15 MG PO CAPS: 15.0000 mg | ORAL_CAPSULE | Freq: Every evening | ORAL | Status: DC | PRN

## 2012-12-11 ENCOUNTER — Other Ambulatory Visit: Payer: Self-pay | Admitting: Internal Medicine

## 2012-12-12 ENCOUNTER — Other Ambulatory Visit: Payer: Medicare Other | Admitting: Internal Medicine

## 2012-12-12 DIAGNOSIS — I10 Essential (primary) hypertension: Secondary | ICD-10-CM

## 2012-12-12 DIAGNOSIS — E559 Vitamin D deficiency, unspecified: Secondary | ICD-10-CM

## 2012-12-12 DIAGNOSIS — E039 Hypothyroidism, unspecified: Secondary | ICD-10-CM

## 2012-12-12 DIAGNOSIS — Z130 Encounter for screening for diseases of the blood and blood-forming organs and certain disorders involving the immune mechanism: Secondary | ICD-10-CM

## 2012-12-12 DIAGNOSIS — Z13220 Encounter for screening for lipoid disorders: Secondary | ICD-10-CM

## 2012-12-12 LAB — COMPREHENSIVE METABOLIC PANEL
ALT: 16 U/L (ref 0–35)
AST: 22 U/L (ref 0–37)
Albumin: 4.3 g/dL (ref 3.5–5.2)
CO2: 32 mEq/L (ref 19–32)
Calcium: 10 mg/dL (ref 8.4–10.5)
Chloride: 100 mEq/L (ref 96–112)
Creat: 1.34 mg/dL — ABNORMAL HIGH (ref 0.50–1.10)
Potassium: 3.8 mEq/L (ref 3.5–5.3)
Sodium: 139 mEq/L (ref 135–145)
Total Protein: 7 g/dL (ref 6.0–8.3)

## 2012-12-12 LAB — CBC WITH DIFFERENTIAL/PLATELET
Basophils Absolute: 0 10*3/uL (ref 0.0–0.1)
Eosinophils Relative: 3 % (ref 0–5)
Lymphocytes Relative: 33 % (ref 12–46)
Lymphs Abs: 1.7 10*3/uL (ref 0.7–4.0)
MCV: 90.1 fL (ref 78.0–100.0)
Neutro Abs: 2.8 10*3/uL (ref 1.7–7.7)
Platelets: 197 10*3/uL (ref 150–400)
RBC: 3.93 MIL/uL (ref 3.87–5.11)
RDW: 13.2 % (ref 11.5–15.5)
WBC: 5 10*3/uL (ref 4.0–10.5)

## 2012-12-12 LAB — LIPID PANEL
Cholesterol: 178 mg/dL (ref 0–200)
HDL: 89 mg/dL (ref >39)
Total CHOL/HDL Ratio: 2 Ratio
Triglycerides: 58 mg/dL (ref <150)
VLDL: 12 mg/dL (ref 0–40)

## 2012-12-13 ENCOUNTER — Encounter: Payer: Self-pay | Admitting: Internal Medicine

## 2012-12-13 ENCOUNTER — Ambulatory Visit (INDEPENDENT_AMBULATORY_CARE_PROVIDER_SITE_OTHER): Payer: Medicare Other | Admitting: Internal Medicine

## 2012-12-13 VITALS — BP 150/82 | HR 72 | Temp 98.4°F | Ht 61.25 in | Wt 136.0 lb

## 2012-12-13 DIAGNOSIS — Z0000 Encounter for general adult medical examination without abnormal findings: Secondary | ICD-10-CM

## 2012-12-13 DIAGNOSIS — N189 Chronic kidney disease, unspecified: Secondary | ICD-10-CM

## 2012-12-13 DIAGNOSIS — E785 Hyperlipidemia, unspecified: Secondary | ICD-10-CM

## 2012-12-13 DIAGNOSIS — R0989 Other specified symptoms and signs involving the circulatory and respiratory systems: Secondary | ICD-10-CM

## 2012-12-13 DIAGNOSIS — E039 Hypothyroidism, unspecified: Secondary | ICD-10-CM

## 2012-12-13 DIAGNOSIS — Z23 Encounter for immunization: Secondary | ICD-10-CM

## 2012-12-13 DIAGNOSIS — I10 Essential (primary) hypertension: Secondary | ICD-10-CM

## 2012-12-13 DIAGNOSIS — F411 Generalized anxiety disorder: Secondary | ICD-10-CM

## 2012-12-13 LAB — POCT URINALYSIS DIPSTICK
Bilirubin, UA: NEGATIVE
Blood, UA: NEGATIVE
Glucose, UA: NEGATIVE
Ketones, UA: NEGATIVE
Leukocytes, UA: NEGATIVE
Spec Grav, UA: 1.015
pH, UA: 5.5

## 2012-12-13 LAB — VITAMIN D 25 HYDROXY (VIT D DEFICIENCY, FRACTURES): Vit D, 25-Hydroxy: 61 ng/mL (ref 30–89)

## 2012-12-13 NOTE — Progress Notes (Signed)
Subjective:    Patient ID: Melinda Payne, female    DOB: Aug 13, 1940, 72 y.o.   MRN: [id number]  HPI  72 year old White female for health maintenance and evaluation of medical problems. Patient has history of melanoma left neck, insomnia, anxiety, lumbar spinal stenosis, hypertension, hyperlipidemia, vitamin D deficiency, right carotid bruit, chronic kidney disease stage I.  She had a compound dysplastic nevus removed from her back in 1998. Herniated disc surgery at L5 1998. Tubal ligation in the late 1970s. Melanoma removed from her left neck 1997. [place] hysterectomy BSO for endometrial carcinoma 1999.  Colonoscopy done March 2011. Pneumovax immunization 2007. Tetanus immunization 2009. Bone density study September 2010.  Intolerant of penicillin-he causes a rash. Codeine causes a rash but she can take Tussionex. Avelox causes hallucinations.  Social history: Does not smoke or consume alcohol. Her only son, was killed in an automobile accident the late 49s at age 65. She had considerable grief with that. She is married. No other children.  Family history: Father died at age 29 with cirrhosis of the liver. One sister with history of scleroderma and Raynaud's disease but died from complications of scleroderma. One sister with history of cervical cancer. One sister with Alzheimer's disease had an MI in July. Another sister possibly with this management. Brother on Aricept for memory loss.  We have followed her for an elevated serum creatinine. It has been as high as 1.40 in October 2012 but improved to 1.15 in November 2012.    Review of Systems  Constitutional: Negative.   HENT: Negative.   Eyes: Negative.   Respiratory: Negative.   Cardiovascular: Negative.   Gastrointestinal: Negative.   Endocrine: Negative.   Genitourinary: Negative.   Allergic/Immunologic: Negative.   Neurological: Negative.   Hematological: Negative.   Psychiatric/Behavioral:       Anxiety-fearful she  will developed dementia       Objective:   Physical Exam  Vitals reviewed. Constitutional: She is oriented to person, place, and time. She appears well-developed and well-nourished. No distress.  HENT:  Head: Normocephalic and atraumatic.  Right Ear: External ear normal.  Left Ear: External ear normal.  Mouth/Throat: Oropharynx is clear and moist. No oropharyngeal exudate.  Eyes: Conjunctivae and EOM are normal. Pupils are equal, round, and reactive to light. Right eye exhibits no discharge. Left eye exhibits no discharge. No scleral icterus.  Neck: Neck supple. No JVD present. No thyromegaly present.  Right carotid bruit  Cardiovascular: Normal rate, regular rhythm and normal heart sounds.   No murmur heard. Pulmonary/Chest: Effort normal and breath sounds normal. No respiratory distress. She has no wheezes. She has no rales. She exhibits no tenderness.  Breasts normal female  Abdominal: Soft. Bowel sounds are normal. She exhibits no distension and no mass. There is no tenderness. There is no rebound and no guarding.  Genitourinary:  Status post TAH/BSO  Musculoskeletal: Normal range of motion. She exhibits no edema.  Lymphadenopathy:    She has no cervical adenopathy.  Neurological: She is alert and oriented to person, place, and time. She has normal reflexes. She displays normal reflexes. No cranial nerve deficit. Coordination normal.  Skin: Skin is warm and dry. No rash noted. She is not diaphoretic.  Psychiatric: She has a normal mood and affect. Her behavior is normal. Judgment and thought content normal.             Assessment & Plan:  Hypertension-stable   Hyperlipidemia- stable  History of generalized anxiety disorder  History of  vitamin D deficiency  Hypothyroidism  Right carotid bruit  Chronic kidney disease stage I  History of melanoma-no recurrence  History of endometrial carcinoma status post total abdominal hysterectomy BSO 1999  History of  elevated TSH-hypothyroidism  Plan: Continue current medications and return in 6 months for office visit, TSH, lipid panel and basic metabolic panel.    Subjective:   Patient presents for Medicare Annual/Subsequent preventive examination.   Review Past Medical/Family/Social: see EPIC   Risk Factors  Current exercise habits: walks Dietary issues discussed: low fat low carb  Cardiac risk factors: HTN  Depression Screen  (Note: if answer to either of the following is "Yes", a more complete depression screening is indicated)   Over the past two weeks, have you felt down, depressed or hopeless? No  Over the past two weeks, have you felt little interest or pleasure in doing things? No Have you lost interest or pleasure in daily life? No Do you often feel hopeless? No Do you cry easily over simple problems? No   Activities of Daily Living  In your present state of health, do you have any difficulty performing the following activities?:   Driving? No  Managing money? No  Feeding yourself? No  Getting from bed to chair? No  Climbing a flight of stairs? No  Preparing food and eating?: No  Bathing or showering? No  Getting dressed: No  Getting to the toilet? No  Using the toilet:No  Moving around from place to place: No  In the past year have you fallen or had a near fall?:No  Are you sexually active? No  Do you have more than one partner? No   Hearing Difficulties: No  Do you often ask people to speak up or repeat themselves? No  Do you experience ringing or noises in your ears? No  Do you have difficulty understanding soft or whispered voices? No  Do you feel that you have a problem with memory? No Do you often misplace items? No    Home Safety:  Do you have a smoke alarm at your residence? Yes Do you have grab bars in the bathroom? no Do you have throw rugs in your house? Yes but has rubber mat underneath   Cognitive Testing  Alert? Yes Normal Appearance?Yes   Oriented to person? Yes Place? Yes  Time? Yes  Recall of three objects? Yes  Can perform simple calculations? Yes  Displays appropriate judgment?Yes  Can read the correct time from a watch face?Yes   List the Names of Other Physician/Practitioners you currently use:  See referral list for the physicians patient is currently seeing. Dr. Dione Booze, Dr. Karlyn Agee    Review of Systems:see above   Objective:     General appearance: Appears stated age and mildly obese  Head: Normocephalic, without obvious abnormality, atraumatic  Eyes: conj clear, EOMi PEERLA  Ears: normal TM's and external ear canals both ears  Nose: Nares normal. Septum midline. Mucosa normal. No drainage or sinus tenderness.  Throat: lips, mucosa, and tongue normal; teeth and gums normal  Neck: no adenopathy, no carotid bruit, no JVD, supple, symmetrical, trachea midline and thyroid not enlarged, symmetric, no tenderness/mass/nodules  No CVA tenderness.  Lungs: clear to auscultation bilaterally  Breasts: normal appearance, no masses or tenderness Heart: regular rate and rhythm, S1, S2 normal, no murmur, click, rub or gallop  Abdomen: soft, non-tender; bowel sounds normal; no masses, no organomegaly  Musculoskeletal: ROM normal in all joints, no crepitus, no deformity, Normal muscle  strengthen. Back  is symmetric, no curvature. Skin: Skin color, texture, turgor normal. No rashes or lesions  Lymph nodes: Cervical, supraclavicular, and axillary nodes normal.  Neurologic: CN 2 -12 Normal, Normal symmetric reflexes. Normal coordination and gait  Psych: Alert & Oriented x 3, Mood appear stable.    Assessment:    Annual wellness medicare exam   Plan:    During the course of the visit the patient was educated and counseled about appropriate screening and preventive services including:  Annual mammogram Annual flu vaccine      Patient Instructions (the written plan) was given to the patient.  Medicare  Attestation  I have personally reviewed:  The patient's medical and social history  Their use of alcohol, tobacco or illicit drugs  Their current medications and supplements  The patient's functional ability including ADLs,fall risks, home safety risks, cognitive, and hearing and visual impairment  Diet and physical activities  Evidence for depression or mood disorders  The patient's weight, height, BMI, and visual acuity have been recorded in the chart. I have made referrals, counseling, and provided education to the patient based on review of the above and I have provided the patient with a written personalized care plan for preventive services.

## 2013-01-07 ENCOUNTER — Other Ambulatory Visit: Payer: Self-pay

## 2013-01-07 MED ORDER — LEVOTHYROXINE SODIUM 75 MCG PO TABS: 75.0000 ug | ORAL_TABLET | Freq: Every day | ORAL | Status: DC

## 2013-01-20 ENCOUNTER — Other Ambulatory Visit: Payer: Self-pay | Admitting: Internal Medicine

## 2013-03-02 NOTE — Patient Instructions (Signed)
Continue same medications and return in 6 months for office visit, blood pressure check, lipid panel, liver functions, basic metabolic panel, TSH. Please have mammogram.

## 2013-03-19 ENCOUNTER — Other Ambulatory Visit: Payer: Self-pay

## 2013-03-19 MED ORDER — SIMVASTATIN 10 MG PO TABS: 10.0000 mg | ORAL_TABLET | Freq: Every day | ORAL | Status: DC

## 2013-05-22 DIAGNOSIS — H43819 Vitreous degeneration, unspecified eye: Secondary | ICD-10-CM | POA: Insufficient documentation

## 2013-05-22 DIAGNOSIS — H3560 Retinal hemorrhage, unspecified eye: Secondary | ICD-10-CM | POA: Insufficient documentation

## 2013-05-22 DIAGNOSIS — D3130 Benign neoplasm of unspecified choroid: Secondary | ICD-10-CM | POA: Insufficient documentation

## 2013-05-22 DIAGNOSIS — Z961 Presence of intraocular lens: Secondary | ICD-10-CM | POA: Insufficient documentation

## 2013-05-26 ENCOUNTER — Other Ambulatory Visit: Payer: Self-pay

## 2013-05-26 MED ORDER — TEMAZEPAM 15 MG PO CAPS: 15.0000 mg | ORAL_CAPSULE | Freq: Every evening | ORAL | Status: DC | PRN

## 2013-06-12 ENCOUNTER — Other Ambulatory Visit: Payer: Self-pay

## 2013-06-12 MED ORDER — ALPRAZOLAM 0.25 MG PO TABS: 0.2500 mg | ORAL_TABLET | Freq: Two times a day (BID) | ORAL | Status: DC | PRN

## 2013-06-20 ENCOUNTER — Encounter: Payer: Self-pay | Admitting: Internal Medicine

## 2013-06-20 ENCOUNTER — Ambulatory Visit (INDEPENDENT_AMBULATORY_CARE_PROVIDER_SITE_OTHER): Payer: Medicare Other | Admitting: Internal Medicine

## 2013-06-20 VITALS — BP 148/78 | HR 80 | Temp 97.9°F | Wt 135.0 lb

## 2013-06-20 DIAGNOSIS — E039 Hypothyroidism, unspecified: Secondary | ICD-10-CM

## 2013-06-20 DIAGNOSIS — E7800 Pure hypercholesterolemia, unspecified: Secondary | ICD-10-CM | POA: Insufficient documentation

## 2013-06-20 DIAGNOSIS — I10 Essential (primary) hypertension: Secondary | ICD-10-CM

## 2013-06-20 DIAGNOSIS — Z79899 Other long term (current) drug therapy: Secondary | ICD-10-CM

## 2013-06-20 DIAGNOSIS — E785 Hyperlipidemia, unspecified: Secondary | ICD-10-CM

## 2013-06-20 LAB — LIPID PANEL
CHOLESTEROL: 182 mg/dL (ref 0–200)
HDL: 83 mg/dL (ref >39)
LDL CALC: 85 mg/dL (ref 0–99)
Total CHOL/HDL Ratio: 2.2 Ratio
Triglycerides: 69 mg/dL (ref <150)
VLDL: 14 mg/dL (ref 0–40)

## 2013-06-20 LAB — HEPATIC FUNCTION PANEL
ALK PHOS: 61 U/L (ref 39–117)
ALT: 13 U/L (ref 0–35)
AST: 19 U/L (ref 0–37)
Albumin: 4.5 g/dL (ref 3.5–5.2)
Bilirubin, Direct: 0.2 mg/dL (ref 0.0–0.3)
Indirect Bilirubin: 0.6 mg/dL (ref 0.2–1.2)
TOTAL PROTEIN: 7.3 g/dL (ref 6.0–8.3)
Total Bilirubin: 0.8 mg/dL (ref 0.2–1.2)

## 2013-06-20 NOTE — Addendum Note (Signed)
Addended by: Brett Canales on: 06/20/2013 03:01 PM   Modules accepted: Orders

## 2013-06-20 NOTE — Progress Notes (Signed)
   Subjective:    Patient ID: Read Drivers, female    DOB: Jul 11, 1940, 73 y.o.   MRN: [id number]  HPI For  6 month recheck. No new complaints or problems. History of hypertension, hyperlipidemia, hypothyroidism, chronic kidney disease, anxiety. Takes temazepam for sleep. Long-standing history of anxiety and insomnia. Lab work is pending. Did not take blood pressure medication. Systolic pressure elevated. Remote history of melanoma. History of lumbar spinal stenosis. History of fibrocystic breast disease.    Review of Systems     Objective:   Physical Exam Skin warm and dry. Nodes none. Neck is supple without thyromegaly. Right carotid bruit. Chest clear to auscultation. Cardiac exam regular rate and rhythm normal S1 and S2. Extremities without edema.        Assessment & Plan:  HTN-did not take blood pressure medication before coming to office today Anxiety-treated with Xanax Hypothyroidism-TSH pending Chronic kidney disease stage I Right carotid bruit-asymptomatic Insomnia-treated with temazepam Hyperlipidemia-on statin therapy Plan: Keep BP readings x 1 week and call with results. Continue same medications. Physical exam October 2015.  25 minutes spent with patient

## 2013-06-20 NOTE — Patient Instructions (Signed)
Continue same meds. Lab results are pending. Keep BP readings at home daily x 0ne week and call with results.

## 2013-06-21 LAB — TSH: TSH: 0.419 u[IU]/mL (ref 0.350–4.500)

## 2013-06-23 LAB — BASIC METABOLIC PANEL
BUN: 21 mg/dL (ref 6–23)
CO2: 30 meq/L (ref 19–32)
CREATININE: 1.13 mg/dL — AB (ref 0.50–1.10)
Calcium: 10 mg/dL (ref 8.4–10.5)
Chloride: 99 mEq/L (ref 96–112)
GLUCOSE: 89 mg/dL (ref 70–99)
Potassium: 4.3 mEq/L (ref 3.5–5.3)
Sodium: 139 mEq/L (ref 135–145)

## 2013-06-26 ENCOUNTER — Other Ambulatory Visit: Payer: Self-pay | Admitting: Internal Medicine

## 2013-06-27 ENCOUNTER — Telehealth: Payer: Self-pay | Admitting: Internal Medicine

## 2013-06-27 NOTE — Telephone Encounter (Signed)
Advised patient to take meds and then check pressure at least 2 hours after meds.  These were all done within 15-30 minutes after taking meds in the morning.  Please advise.

## 2013-06-27 NOTE — Telephone Encounter (Signed)
These are OK but continue to monitor esp 2 hours after meds.

## 2013-06-27 NOTE — Telephone Encounter (Signed)
Left message for patient on her home machine to please continue to record pressures.  Instructed patient to take meds first thing in the a.m., and record pressures around lunch time each day.  Trying to be more on a schedule and definitely checking pressure AFTER she has had her medications on board at least 2 hours.  Patient is to do this for a week to ten days and call us back with those readings.  Patient advised to please contact the office should she have any questions regarding these instructions.

## 2013-07-08 ENCOUNTER — Telehealth: Payer: Self-pay | Admitting: Internal Medicine

## 2013-07-08 NOTE — Telephone Encounter (Signed)
LM on patient's home machine.  Patient given appointment for Monday, 5/11 @ 2:15 p.m.  Will confirm with patient when she returns the call.

## 2013-07-08 NOTE — Telephone Encounter (Signed)
Office visit next week to consult about BP management.

## 2013-07-14 ENCOUNTER — Encounter: Payer: Self-pay | Admitting: Internal Medicine

## 2013-07-14 ENCOUNTER — Ambulatory Visit (INDEPENDENT_AMBULATORY_CARE_PROVIDER_SITE_OTHER): Payer: Medicare Other | Admitting: Internal Medicine

## 2013-07-14 VITALS — BP 138/76 | HR 72 | Wt 135.0 lb

## 2013-07-14 DIAGNOSIS — I10 Essential (primary) hypertension: Secondary | ICD-10-CM

## 2013-07-14 DIAGNOSIS — R0989 Other specified symptoms and signs involving the circulatory and respiratory systems: Secondary | ICD-10-CM

## 2013-07-14 MED ORDER — HYDROCHLOROTHIAZIDE 25 MG PO TABS: 25.0000 mg | ORAL_TABLET | Freq: Every day | ORAL | Status: DC

## 2013-07-14 MED ORDER — ATENOLOL 25 MG PO TABS: 25.0000 mg | ORAL_TABLET | Freq: Every day | ORAL | Status: DC

## 2013-07-14 NOTE — Progress Notes (Signed)
   Subjective:    Patient ID: Read Drivers, female    DOB: Aug 22, 1940, 73 y.o.   MRN: [id number]  HPI         Recent issues with labile blood pressure despite being on HCTZ and amlodipine. Seems to be worse after her sister who has dementia calls. Blood pressures have ranged from 158/78, 148/78, 128/78, 138/78 April 25 through May1st, last night after talking with her sister her blood pressure was 518 systolically. Today it is 138/76.   Review of Systems     Objective:   Physical Exam Skin warm and dry. Nodes none. Chest clear to auscultation. Cardiac exam regular rate and rhythm normal S1-S2. Extremities without edema.       Assessment & Plan:  Labile hypertension  Plan: Continue amlodipine 5 mg daily and HCTZ. Add Tenormin 25 mg daily and call with blood pressure readings in 2 weeks.

## 2013-07-14 NOTE — Patient Instructions (Signed)
Start tenormin 25 mg daily in addition to other BP meds and call with me with readings in 2 weeks.

## 2013-07-29 ENCOUNTER — Other Ambulatory Visit: Payer: Self-pay

## 2013-08-04 ENCOUNTER — Telehealth: Payer: Self-pay | Admitting: Internal Medicine

## 2013-08-04 NOTE — Telephone Encounter (Signed)
Excellent response to Tenormin 25 mg daily. Please call and tell her to continue this med. Watch BP 2-3 times weekly.

## 2013-08-05 NOTE — Telephone Encounter (Signed)
Patient informed. 

## 2013-08-15 ENCOUNTER — Other Ambulatory Visit: Payer: Self-pay | Admitting: Internal Medicine

## 2013-08-25 ENCOUNTER — Telehealth: Payer: Self-pay | Admitting: Internal Medicine

## 2013-08-25 NOTE — Telephone Encounter (Signed)
Excellent- nothing further to advise.

## 2013-09-22 ENCOUNTER — Telehealth: Payer: Self-pay | Admitting: Internal Medicine

## 2013-09-22 NOTE — Telephone Encounter (Signed)
Can try taking in am was trying to spread meds out for better control.

## 2013-10-23 ENCOUNTER — Other Ambulatory Visit: Payer: Self-pay | Admitting: Internal Medicine

## 2013-11-13 ENCOUNTER — Other Ambulatory Visit: Payer: Self-pay

## 2013-11-13 MED ORDER — TEMAZEPAM 15 MG PO CAPS: 15.0000 mg | ORAL_CAPSULE | Freq: Every evening | ORAL | Status: DC | PRN

## 2013-12-01 ENCOUNTER — Ambulatory Visit (INDEPENDENT_AMBULATORY_CARE_PROVIDER_SITE_OTHER): Payer: Medicare Other | Admitting: Internal Medicine

## 2013-12-01 ENCOUNTER — Encounter: Payer: Self-pay | Admitting: Internal Medicine

## 2013-12-01 VITALS — BP 120/70 | HR 58 | Temp 97.6°F | Ht 61.25 in | Wt 136.0 lb

## 2013-12-01 DIAGNOSIS — Z23 Encounter for immunization: Secondary | ICD-10-CM

## 2013-12-01 MED ORDER — TETANUS-DIPHTH-ACELL PERTUSSIS 5-2.5-18.5 LF-MCG/0.5 IM SUSP: 0.5000 mL | Freq: Once | INTRAMUSCULAR | Status: DC

## 2013-12-02 NOTE — Patient Instructions (Signed)
Immunizations given. Call if redness develops at infection site fever or prolonged arm soreness

## 2013-12-02 NOTE — Progress Notes (Signed)
   Subjective:    Patient ID: Melinda Payne, female    DOB: [birth year], 73 y.o.   MRN: [id number]  HPI  For nurse visit- vaccine administration only.    Review of Systems     Objective:   Physical Exam        Assessment & Plan:

## 2013-12-08 ENCOUNTER — Ambulatory Visit (INDEPENDENT_AMBULATORY_CARE_PROVIDER_SITE_OTHER): Payer: Medicare Other | Admitting: Internal Medicine

## 2013-12-08 DIAGNOSIS — Z23 Encounter for immunization: Secondary | ICD-10-CM

## 2013-12-10 ENCOUNTER — Ambulatory Visit: Payer: Medicare Other | Admitting: Internal Medicine

## 2013-12-15 ENCOUNTER — Other Ambulatory Visit: Payer: Self-pay | Admitting: Internal Medicine

## 2013-12-15 MED ORDER — ALPRAZOLAM 0.25 MG PO TABS: 0.2500 mg | ORAL_TABLET | Freq: Two times a day (BID) | ORAL | Status: DC | PRN

## 2013-12-16 ENCOUNTER — Other Ambulatory Visit: Payer: Medicare Other | Admitting: Internal Medicine

## 2013-12-16 DIAGNOSIS — I10 Essential (primary) hypertension: Secondary | ICD-10-CM

## 2013-12-16 DIAGNOSIS — E038 Other specified hypothyroidism: Secondary | ICD-10-CM

## 2013-12-16 DIAGNOSIS — E559 Vitamin D deficiency, unspecified: Secondary | ICD-10-CM

## 2013-12-16 DIAGNOSIS — N181 Chronic kidney disease, stage 1: Secondary | ICD-10-CM

## 2013-12-16 DIAGNOSIS — Z0000 Encounter for general adult medical examination without abnormal findings: Secondary | ICD-10-CM

## 2013-12-16 LAB — COMPREHENSIVE METABOLIC PANEL
ALBUMIN: 4.2 g/dL (ref 3.5–5.2)
ALT: 16 U/L (ref 0–35)
AST: 19 U/L (ref 0–37)
Alkaline Phosphatase: 60 U/L (ref 39–117)
BUN: 24 mg/dL — ABNORMAL HIGH (ref 6–23)
CALCIUM: 9.8 mg/dL (ref 8.4–10.5)
CHLORIDE: 100 meq/L (ref 96–112)
CO2: 31 meq/L (ref 19–32)
CREATININE: 1.18 mg/dL — AB (ref 0.50–1.10)
Glucose, Bld: 94 mg/dL (ref 70–99)
POTASSIUM: 4.3 meq/L (ref 3.5–5.3)
Sodium: 140 mEq/L (ref 135–145)
TOTAL PROTEIN: 6.7 g/dL (ref 6.0–8.3)
Total Bilirubin: 0.7 mg/dL (ref 0.2–1.2)

## 2013-12-16 LAB — CBC WITH DIFFERENTIAL/PLATELET
Basophils Absolute: 0 10*3/uL (ref 0.0–0.1)
Basophils Relative: 0 % (ref 0–1)
EOS PCT: 2 % (ref 0–5)
Eosinophils Absolute: 0.1 10*3/uL (ref 0.0–0.7)
HEMATOCRIT: 35.5 % — AB (ref 36.0–46.0)
Hemoglobin: 12.4 g/dL (ref 12.0–15.0)
LYMPHS PCT: 37 % (ref 12–46)
Lymphs Abs: 2.3 10*3/uL (ref 0.7–4.0)
MCH: 32.7 pg (ref 26.0–34.0)
MCHC: 34.9 g/dL (ref 30.0–36.0)
MCV: 93.7 fL (ref 78.0–100.0)
MONO ABS: 0.5 10*3/uL (ref 0.1–1.0)
Monocytes Relative: 8 % (ref 3–12)
Neutro Abs: 3.2 10*3/uL (ref 1.7–7.7)
Neutrophils Relative %: 53 % (ref 43–77)
PLATELETS: 181 10*3/uL (ref 150–400)
RBC: 3.79 MIL/uL — ABNORMAL LOW (ref 3.87–5.11)
RDW: 12.9 % (ref 11.5–15.5)
WBC: 6.1 10*3/uL (ref 4.0–10.5)

## 2013-12-16 LAB — LIPID PANEL
CHOL/HDL RATIO: 2.1 ratio
CHOLESTEROL: 158 mg/dL (ref 0–200)
HDL: 76 mg/dL (ref >39)
LDL Cholesterol: 72 mg/dL (ref 0–99)
Triglycerides: 49 mg/dL (ref <150)
VLDL: 10 mg/dL (ref 0–40)

## 2013-12-16 LAB — TSH: TSH: 2.893 u[IU]/mL (ref 0.350–4.500)

## 2013-12-17 LAB — VITAMIN D 25 HYDROXY (VIT D DEFICIENCY, FRACTURES): VIT D 25 HYDROXY: 68 ng/mL (ref 30–89)

## 2013-12-18 ENCOUNTER — Ambulatory Visit (INDEPENDENT_AMBULATORY_CARE_PROVIDER_SITE_OTHER): Payer: Medicare Other | Admitting: Internal Medicine

## 2013-12-18 ENCOUNTER — Encounter: Payer: Self-pay | Admitting: Internal Medicine

## 2013-12-18 VITALS — BP 124/78 | HR 72 | Temp 98.2°F | Ht 61.0 in | Wt 138.0 lb

## 2013-12-18 DIAGNOSIS — I10 Essential (primary) hypertension: Secondary | ICD-10-CM

## 2013-12-18 DIAGNOSIS — E785 Hyperlipidemia, unspecified: Secondary | ICD-10-CM

## 2013-12-18 DIAGNOSIS — F411 Generalized anxiety disorder: Secondary | ICD-10-CM

## 2013-12-18 DIAGNOSIS — N181 Chronic kidney disease, stage 1: Secondary | ICD-10-CM

## 2013-12-18 DIAGNOSIS — E039 Hypothyroidism, unspecified: Secondary | ICD-10-CM

## 2013-12-18 DIAGNOSIS — Z8639 Personal history of other endocrine, nutritional and metabolic disease: Secondary | ICD-10-CM

## 2013-12-18 DIAGNOSIS — C541 Malignant neoplasm of endometrium: Secondary | ICD-10-CM

## 2013-12-18 DIAGNOSIS — Z85820 Personal history of malignant melanoma of skin: Secondary | ICD-10-CM

## 2013-12-18 DIAGNOSIS — Z0000 Encounter for general adult medical examination without abnormal findings: Secondary | ICD-10-CM

## 2013-12-18 DIAGNOSIS — R0989 Other specified symptoms and signs involving the circulatory and respiratory systems: Secondary | ICD-10-CM

## 2013-12-18 LAB — POCT URINALYSIS DIPSTICK
Bilirubin, UA: NEGATIVE
Blood, UA: NEGATIVE
GLUCOSE UA: NEGATIVE
Ketones, UA: NEGATIVE
LEUKOCYTES UA: NEGATIVE
NITRITE UA: NEGATIVE
Protein, UA: NEGATIVE
Spec Grav, UA: 1.01
Urobilinogen, UA: NEGATIVE
pH, UA: 6

## 2013-12-18 NOTE — Patient Instructions (Addendum)
Continue same meds and return in 6 months 

## 2013-12-18 NOTE — Progress Notes (Signed)
Subjective:    Patient ID: Melinda Payne, female    DOB: [birth year], 73 y.o.   MRN: [id number]  HPI   73 year old White Female for health maintenance and evaluation of medical issues. She has a history of melanoma left neck, insomnia, anxiety, lumbar spinal stenosis, hypertension, hyperlipidemia, vitamin D deficiency, right carotid bruit, chronic kidney disease stage I.  Patient had a compound dysplastic nevus removed from her back in 1998. Herniated disc surgery at L5 in 1998. Tubal ligation in the late 1970s. Melanoma removed from her left neck in 1997. Abdominal hysterectomy BSO for endometrial carcinoma 1999.  She had colonoscopy performed March 2011. Tetanus immunization 2009. Pneumovax immunization 2007. Bone density study 2010.  She is intolerant of penicillin causes a rash. Codeine causes a rash but she can take Tussionex. Avelox causes hallucinations.  Social history: Does not smoke or consume alcohol. Her only son was killed in all mobility asked in the late 17s at age 71. She had considerable grief with that. She is married. No other children.  Family history: Father died at age 15 with cirrhosis of the liver. One sister with history of scleroderma and Raynaud's disease died from complications of scleroderma. One sister with history of cervical cancer. One sister with Alzheimer's disease had an MI in July 2014. Another sister possibly with dementia. Brother on Aricept for memory loss.  She has a history of elevated serum creatinine as high as 1.4 in October 2012 but improved to 1.15 in November 2012.       Review of Systems  Constitutional: Negative.   HENT: Negative.   Eyes: Negative.   Gastrointestinal: Negative.   Neurological: Negative.   Hematological: Negative.   Psychiatric/Behavioral:       Anxiety and insomnia       Objective:   Physical Exam  Constitutional: She is oriented to person, place, and time. She appears well-developed and well-nourished. No  distress.  HENT:  Head: Normocephalic and atraumatic.  Right Ear: External ear normal.  Left Ear: External ear normal.  Mouth/Throat: Oropharynx is clear and moist. No oropharyngeal exudate.  Eyes: Conjunctivae and EOM are normal. Pupils are equal, round, and reactive to light. Right eye exhibits no discharge. Left eye exhibits no discharge. No scleral icterus.  Neck: Neck supple. No JVD present. No thyromegaly present.  Cardiovascular: Normal rate, regular rhythm and intact distal pulses.   Right carotid bruit  Pulmonary/Chest: Effort normal and breath sounds normal. She has no wheezes. She has no rales.  Breasts normal female  Abdominal: Soft. Bowel sounds are normal. She exhibits no distension and no mass. There is no tenderness. There is no rebound and no guarding.  Genitourinary:  Deferred status post total abdominal hysterectomy BSO  Musculoskeletal: Normal range of motion. She exhibits no edema.  Lymphadenopathy:    She has no cervical adenopathy.  Neurological: She is alert and oriented to person, place, and time. She has normal reflexes. No cranial nerve deficit. Coordination normal.  Skin: Skin is warm and dry. No rash noted. She is not diaphoretic.  Psychiatric: She has a normal mood and affect. Her behavior is normal. Judgment and thought content normal.  Vitals reviewed.         Assessment & Plan:  Hypertension-stable on current regimen  Hyperlipidemia-stable on current regimen  History of generalized anxiety disorder  History of vitamin D deficiency  Hypothyroidism-stable on thyroid replacement  Right carotid bruit-asymptomatic  Chronic kidney disease stage I stable at 1.18  History of  melanoma left neck without recurrence  History of endometrial carcinoma status post total.we'll hysterectomy BSO 1999  Plan: Continue same medications and return in 6 months for office visit and lipid panel as well as TSH and basic metabolic panel.                       Subjective:   Patient presents for Medicare Annual/Subsequent preventive examination.  Review Past Medical/Family/Social: see above   Risk Factors  Current exercise habits: sedentary Dietary issues discussed: low fat low carb  Cardiac risk factors: HTN Hyperlipidemia  Depression Screen negative (Note: if answer to either of the following is "Yes", a more complete depression screening is indicated)   Over the past two weeks, have you felt down, depressed or hopeless? No  Over the past two weeks, have you felt little interest or pleasure in doing things? No Have you lost interest or pleasure in daily life? No Do you often feel hopeless? No Do you cry easily over simple problems? No   Activities of Daily Living  In your present state of health, do you have any difficulty performing the following activities?:   Driving? No  Managing money? No  Feeding yourself? No  Getting from bed to chair? No  Climbing a flight of stairs? No  Preparing food and eating?: No  Bathing or showering? No  Getting dressed: No  Getting to the toilet? No  Using the toilet:No  Moving around from place to place: No  In the past year have you fallen or had a near fall?:No  Are you sexually active? No  Do you have more than one partner? No   Hearing Difficulties: No  Do you often ask people to speak up or repeat themselves? No  Do you experience ringing or noises in your ears? No  Do you have difficulty understanding soft or whispered voices? No  Do you feel that you have a problem with memory? No Do you often misplace items? Occasionally- once in a while   Home Safety:  Do you have a smoke alarm at your residence? Yes Do you have grab bars in the bathroom? no Do you have throw rugs in your house?  yes   Cognitive Testing  Alert? Yes Normal Appearance?Yes  Oriented to person? Yes Place? Yes  Time? Yes  Recall of three objects? Yes  Can perform simple calculations? Yes   Displays appropriate judgment?Yes  Can Melinda the correct time from a watch face?Yes   List the Names of Other Physician/Practitioners you currently use:  See referral list for the physicians patient is currently seeing.     Review of Systems:see above   Objective:     General appearance: Appears stated age   Head: Normocephalic, without obvious abnormality, atraumatic  Eyes: conj clear, EOMi PEERLA  Ears: normal TM's and external ear canals both ears  Nose: Nares normal. Septum midline. Mucosa normal. No drainage or sinus tenderness.  Throat: lips, mucosa, and tongue normal; teeth and gums normal  Neck: no adenopathy, right carotid bruit, no JVD, supple, symmetrical, trachea midline and thyroid not enlarged, symmetric, no tenderness/mass/nodules  No CVA tenderness.  Lungs: clear to auscultation bilaterally  Breasts: normal appearance, no masses or tenderness Heart: regular rate and rhythm, S1, S2 normal, no murmur, click, rub or gallop  Abdomen: soft, non-tender; bowel sounds normal; no masses, no organomegaly  Musculoskeletal: ROM normal in all joints, no crepitus, no deformity, Normal muscle strengthen. Back  is symmetric, no  curvature. Skin: Skin color, texture, turgor normal. No rashes or lesions  Lymph nodes: Cervical, supraclavicular, and axillary nodes normal.  Neurologic: CN 2 -12 Normal, Normal symmetric reflexes. Normal coordination and gait  Psych: Alert & Oriented x 3, Mood appear stable.    Assessment:    Annual wellness medicare exam   Plan:    During the course of the visit the patient was educated and counseled about appropriate screening and preventive services including:   Mammogram     Patient Instructions (the written plan) was given to the patient.  Medicare Attestation  I have personally reviewed:  The patient's medical and social history  Their use of alcohol, tobacco or illicit drugs  Their current medications and supplements  The patient's  functional ability including ADLs,fall risks, home safety risks, cognitive, and hearing and visual impairment  Diet and physical activities  Evidence for depression or mood disorders  The patient's weight, height, BMI, and visual acuity have been recorded in the chart. I have made referrals, counseling, and provided education to the patient based on review of the above and I have provided the patient with a written personalized care plan for preventive services.

## 2013-12-22 ENCOUNTER — Other Ambulatory Visit: Payer: Self-pay | Admitting: Internal Medicine

## 2014-02-18 ENCOUNTER — Encounter: Payer: Self-pay | Admitting: Internal Medicine

## 2014-02-26 ENCOUNTER — Encounter: Payer: Self-pay | Admitting: Internal Medicine

## 2014-02-26 ENCOUNTER — Ambulatory Visit (INDEPENDENT_AMBULATORY_CARE_PROVIDER_SITE_OTHER): Payer: Medicare Other | Admitting: Internal Medicine

## 2014-02-26 VITALS — BP 118/62 | HR 61 | Temp 99.5°F

## 2014-02-26 DIAGNOSIS — J0100 Acute maxillary sinusitis, unspecified: Secondary | ICD-10-CM

## 2014-02-26 DIAGNOSIS — H6503 Acute serous otitis media, bilateral: Secondary | ICD-10-CM

## 2014-02-26 MED ORDER — CLARITHROMYCIN 500 MG PO TABS: 500.0000 mg | ORAL_TABLET | Freq: Two times a day (BID) | ORAL | Status: DC

## 2014-02-26 MED ORDER — HYDROCODONE-HOMATROPINE 5-1.5 MG/5ML PO SYRP: 5.0000 mL | ORAL_SOLUTION | Freq: Three times a day (TID) | ORAL | Status: DC | PRN

## 2014-03-02 ENCOUNTER — Encounter: Payer: Self-pay | Admitting: Internal Medicine

## 2014-03-02 ENCOUNTER — Ambulatory Visit (INDEPENDENT_AMBULATORY_CARE_PROVIDER_SITE_OTHER): Payer: Medicare Other | Admitting: Internal Medicine

## 2014-03-02 ENCOUNTER — Ambulatory Visit
Admission: RE | Admit: 2014-03-02 | Discharge: 2014-03-02 | Disposition: A | Discharge status: Home / Self Care | Payer: Medicare Other | Source: Ambulatory Visit | Attending: Internal Medicine | Admitting: Internal Medicine

## 2014-03-02 ENCOUNTER — Telehealth: Payer: Self-pay | Admitting: *Deleted

## 2014-03-02 VITALS — BP 118/62 | HR 54 | Temp 97.9°F

## 2014-03-02 DIAGNOSIS — R05 Cough: Secondary | ICD-10-CM

## 2014-03-02 DIAGNOSIS — J069 Acute upper respiratory infection, unspecified: Secondary | ICD-10-CM

## 2014-03-02 DIAGNOSIS — R059 Cough, unspecified: Secondary | ICD-10-CM

## 2014-03-02 MED ORDER — CEFTRIAXONE SODIUM 1 G IJ SOLR
1.0000 g | Freq: Once | INTRAMUSCULAR | Status: AC
  Administered 2014-03-02: 1 g via INTRAMUSCULAR

## 2014-03-02 NOTE — Telephone Encounter (Signed)
Patient states she has had no improvement in her symptoms since she was seen last week she states she is taking her biaxin. She wants to know if we can call in a different antibiotic for her.

## 2014-03-02 NOTE — Patient Instructions (Addendum)
Continue Biaxin as previously prescribed. Call if not better by December 31.

## 2014-03-02 NOTE — Progress Notes (Signed)
   Subjective:    Patient ID: Melinda Payne, female    DOB: [birth year], 73 y.o.   MRN: [id number]  HPI  Was seen December 24 for respiratory infection and was started on a 10 day course of Biaxin 500 mg twice daily. Called today saying she felt no better. She was wondering if we should change antibiotics. All think she's had antibiotic long enough. I did send her for chest x-ray. Chest x-ray shows COPD/hyperinflation but no evidence of pneumonia. CBC is pending. Felt a bit nauseated taking Biaxin but did not eat a full meal when she took it.    Review of Systems     Objective:   Physical Exam  Skin warm and dry. Nodes none. TMs and pharynx are clear. Neck supple. Chest clear.      Assessment & Plan:  Bronchitis  Plan: 1 g IM Rocephin. Continue Biaxin to complete ten-day course. Call if not better by December 31.

## 2014-03-02 NOTE — Telephone Encounter (Signed)
Please order CXR- PA and lateral STAT. See later today.

## 2014-03-02 NOTE — Telephone Encounter (Signed)
Chest xray ordered Stat. Patient notified to go to [place] for xray. She is to be seen in our office today at 12:15

## 2014-03-03 LAB — CBC WITH DIFFERENTIAL/PLATELET
BASOS ABS: 0 10*3/uL (ref 0.0–0.1)
Basophils Relative: 0 % (ref 0–1)
Eosinophils Absolute: 0.1 10*3/uL (ref 0.0–0.7)
Eosinophils Relative: 1 % (ref 0–5)
HEMATOCRIT: 38.5 % (ref 36.0–46.0)
Hemoglobin: 12.7 g/dL (ref 12.0–15.0)
Lymphocytes Relative: 33 % (ref 12–46)
Lymphs Abs: 1.9 10*3/uL (ref 0.7–4.0)
MCH: 31.6 pg (ref 26.0–34.0)
MCHC: 33 g/dL (ref 30.0–36.0)
MCV: 95.8 fL (ref 78.0–100.0)
MPV: 9.6 fL (ref 9.4–12.4)
Monocytes Absolute: 0.5 10*3/uL (ref 0.1–1.0)
Monocytes Relative: 8 % (ref 3–12)
NEUTROS ABS: 3.3 10*3/uL (ref 1.7–7.7)
NEUTROS PCT: 58 % (ref 43–77)
Platelets: 215 10*3/uL (ref 150–400)
RBC: 4.02 MIL/uL (ref 3.87–5.11)
RDW: 12.2 % (ref 11.5–15.5)
WBC: 5.7 10*3/uL (ref 4.0–10.5)

## 2014-03-04 ENCOUNTER — Telehealth: Payer: Self-pay | Admitting: *Deleted

## 2014-03-04 NOTE — Telephone Encounter (Signed)
Patient called back reviewed lab results with patient

## 2014-03-16 ENCOUNTER — Telehealth: Payer: Self-pay | Admitting: Internal Medicine

## 2014-03-16 NOTE — Telephone Encounter (Signed)
Her insurance states they are no longer going to cover her Restoril 15 mg.  She has 1 week left of this medication.  The options they have given her as consideration are:  Belsomra (she doesn't want to even try this one) Trazodone HCl (1st Tier - $2 copay) Rozerem ($95 copay)  Are either of these something you would consider putting her on??

## 2014-03-19 DIAGNOSIS — Z85820 Personal history of malignant melanoma of skin: Secondary | ICD-10-CM | POA: Diagnosis not present

## 2014-03-19 DIAGNOSIS — D2361 Other benign neoplasm of skin of right upper limb, including shoulder: Secondary | ICD-10-CM | POA: Diagnosis not present

## 2014-03-19 DIAGNOSIS — L821 Other seborrheic keratosis: Secondary | ICD-10-CM | POA: Diagnosis not present

## 2014-04-01 ENCOUNTER — Other Ambulatory Visit: Payer: Self-pay | Admitting: Internal Medicine

## 2014-04-30 NOTE — Progress Notes (Signed)
   Subjective:    Patient ID: Melinda Payne, female    DOB: 26-Jan-1941, 74 y.o.   MRN: [id number]  HPI Has come down with an upper respiratory infection. Likely contracted it at church. Has cough, ear pressure and sinus congestion. No fever or shaking chills. Is coughing a lot in the office today.    Review of Systems     Objective:   Physical Exam  Sounds nasally congested when she speaks. Pharynx slightly injected. TMs full bilaterally. Neck is supple without adenopathy. Chest clear to auscultation without rales or wheezing.      Assessment & Plan:  Acute bilateral serous otitis media  Sinusitis  Plan: Biaxin 500 mg twice daily for 10 days. Hycodan 1 teaspoon by mouth every 8 hours when necessary cough.

## 2014-04-30 NOTE — Patient Instructions (Addendum)
Biaxin 500 mg twice daily for 10 days. Take Hycodan as needed for cough.

## 2014-05-19 ENCOUNTER — Other Ambulatory Visit: Payer: Self-pay | Admitting: *Deleted

## 2014-05-19 MED ORDER — TEMAZEPAM 15 MG PO CAPS: 15.0000 mg | ORAL_CAPSULE | Freq: Every evening | ORAL | Status: DC | PRN

## 2014-05-19 NOTE — Telephone Encounter (Signed)
Refill on temazepam sent to pharmacy

## 2014-05-29 ENCOUNTER — Other Ambulatory Visit: Payer: Self-pay | Admitting: Internal Medicine

## 2014-06-16 ENCOUNTER — Other Ambulatory Visit: Payer: Medicare Other | Admitting: Internal Medicine

## 2014-06-18 ENCOUNTER — Ambulatory Visit: Payer: Medicare Other | Admitting: Internal Medicine

## 2014-06-29 ENCOUNTER — Other Ambulatory Visit: Payer: Self-pay | Admitting: Internal Medicine

## 2014-06-29 ENCOUNTER — Other Ambulatory Visit: Payer: Medicare Other | Admitting: Internal Medicine

## 2014-06-29 DIAGNOSIS — Z79899 Other long term (current) drug therapy: Secondary | ICD-10-CM

## 2014-06-29 DIAGNOSIS — E039 Hypothyroidism, unspecified: Secondary | ICD-10-CM

## 2014-06-29 DIAGNOSIS — E785 Hyperlipidemia, unspecified: Secondary | ICD-10-CM

## 2014-06-29 DIAGNOSIS — Z131 Encounter for screening for diabetes mellitus: Secondary | ICD-10-CM | POA: Diagnosis not present

## 2014-06-29 LAB — LIPID PANEL
CHOLESTEROL: 181 mg/dL (ref 0–200)
HDL: 98 mg/dL (ref >=46)
LDL Cholesterol: 73 mg/dL (ref 0–99)
TRIGLYCERIDES: 48 mg/dL (ref <150)
Total CHOL/HDL Ratio: 1.8 Ratio
VLDL: 10 mg/dL (ref 0–40)

## 2014-06-29 LAB — BASIC METABOLIC PANEL
BUN: 20 mg/dL (ref 6–23)
CHLORIDE: 101 meq/L (ref 96–112)
CO2: 31 mEq/L (ref 19–32)
CREATININE: 1.25 mg/dL — AB (ref 0.50–1.10)
Calcium: 10.1 mg/dL (ref 8.4–10.5)
Glucose, Bld: 95 mg/dL (ref 70–99)
Potassium: 5.3 mEq/L (ref 3.5–5.3)
SODIUM: 141 meq/L (ref 135–145)

## 2014-06-29 LAB — HEPATIC FUNCTION PANEL
ALK PHOS: 67 U/L (ref 39–117)
ALT: 18 U/L (ref 0–35)
AST: 24 U/L (ref 0–37)
Albumin: 4.5 g/dL (ref 3.5–5.2)
Bilirubin, Direct: 0.2 mg/dL (ref 0.0–0.3)
Indirect Bilirubin: 0.6 mg/dL (ref 0.2–1.2)
TOTAL PROTEIN: 7.1 g/dL (ref 6.0–8.3)
Total Bilirubin: 0.8 mg/dL (ref 0.2–1.2)

## 2014-06-29 LAB — TSH: TSH: 2.295 u[IU]/mL (ref 0.350–4.500)

## 2014-06-30 ENCOUNTER — Other Ambulatory Visit: Payer: Self-pay | Admitting: *Deleted

## 2014-06-30 MED ORDER — ALPRAZOLAM 0.25 MG PO TABS: 0.2500 mg | ORAL_TABLET | Freq: Two times a day (BID) | ORAL | Status: DC | PRN

## 2014-06-30 NOTE — Telephone Encounter (Signed)
Alprazolam script called in to patient pharmacy

## 2014-07-02 ENCOUNTER — Encounter: Payer: Self-pay | Admitting: Internal Medicine

## 2014-07-02 ENCOUNTER — Ambulatory Visit (INDEPENDENT_AMBULATORY_CARE_PROVIDER_SITE_OTHER): Payer: Medicare Other | Admitting: Internal Medicine

## 2014-07-02 VITALS — BP 112/66 | HR 67 | Temp 97.9°F | Wt 139.0 lb

## 2014-07-02 DIAGNOSIS — I10 Essential (primary) hypertension: Secondary | ICD-10-CM

## 2014-07-02 DIAGNOSIS — N183 Chronic kidney disease, stage 3 unspecified: Secondary | ICD-10-CM

## 2014-07-02 LAB — HEMOGLOBIN A1C
Hgb A1c MFr Bld: 5.5 % (ref <5.7)
Mean Plasma Glucose: 111 mg/dL (ref <117)

## 2014-07-02 MED ORDER — SIMVASTATIN 10 MG PO TABS: 10.0000 mg | ORAL_TABLET | Freq: Every day | ORAL | Status: DC

## 2014-07-02 MED ORDER — ATENOLOL 25 MG PO TABS: 25.0000 mg | ORAL_TABLET | Freq: Every day | ORAL | Status: DC

## 2014-07-02 MED ORDER — AMLODIPINE BESYLATE 5 MG PO TABS: 5.0000 mg | ORAL_TABLET | Freq: Every day | ORAL | Status: DC

## 2014-07-02 MED ORDER — HYDROCHLOROTHIAZIDE 25 MG PO TABS: 25.0000 mg | ORAL_TABLET | Freq: Every day | ORAL | Status: DC

## 2014-07-02 NOTE — Progress Notes (Signed)
   Subjective:    Patient ID: [name], female    DOB: 1940-07-10, 74 y.o.   MRN: [id number]  HPI Patient in today for six-month recheck. Serum creatinine is elevated at 1.25. I do believe she has an element of chronic kidney disease. Long-standing history of hypertension. We are going to screen her for diabetes. Requesting 90 day mail order prescription refills on 4 medications. This was done. Is to have Prevnar vaccine next month. Still has situational stress taking care of her sisters. Strong family history of dementia. TSH is within normal limits. Lipid panel is stable. Blood pressure is stable. She has a history of hyperlipidemia. History of anxiety and insomnia.    Review of Systems     Objective:   Physical Exam  Skin warm and dry. Nodes none. Pharynx is clear. Neck is supple without JVD thyromegaly or carotid bruits. Chest clear to auscultation. Cardiac exam regular rate and rhythm normal S1 and S2. No abdominal bruits. Extremities without edema.      Assessment & Plan:  Chronic kidney disease-at estimated GFR and do ultrasound of kidneys  Hypertension is-stable  Hyperlipidemia-stable  Anxiety-stable  Insomnia-okay to refill Restoril for 6 months if pharmacy calls  Plan: Needs Prevnar. We can do this next month when we have more vaccine available. Otherwise return in 6 months for physical exam. We'll going to arrange for nephrology consultation. She will have ultrasound of kidneys

## 2014-07-04 LAB — CREATININE WITH EST GFR
CREATININE: 1.32 mg/dL — AB (ref 0.50–1.10)
GFR, Est African American: 47 mL/min — ABNORMAL LOW
GFR, Est Non African American: 41 mL/min — ABNORMAL LOW

## 2014-07-08 ENCOUNTER — Telehealth: Payer: Self-pay | Admitting: *Deleted

## 2014-07-08 DIAGNOSIS — R7989 Other specified abnormal findings of blood chemistry: Secondary | ICD-10-CM

## 2014-07-08 NOTE — Telephone Encounter (Signed)
Referral placed for US Kidneys. Left message for patient to call [facility] to set up appt for [place]. No referral required to have this procedure

## 2014-07-14 ENCOUNTER — Ambulatory Visit
Admission: RE | Admit: 2014-07-14 | Discharge: 2014-07-14 | Disposition: A | Discharge status: Home / Self Care | Payer: Medicare Other | Source: Ambulatory Visit | Attending: Internal Medicine | Admitting: Internal Medicine

## 2014-07-14 ENCOUNTER — Telehealth: Payer: Self-pay | Admitting: *Deleted

## 2014-07-14 DIAGNOSIS — I10 Essential (primary) hypertension: Secondary | ICD-10-CM | POA: Diagnosis not present

## 2014-07-14 DIAGNOSIS — N189 Chronic kidney disease, unspecified: Secondary | ICD-10-CM | POA: Diagnosis not present

## 2014-07-14 DIAGNOSIS — R7989 Other specified abnormal findings of blood chemistry: Secondary | ICD-10-CM

## 2014-07-14 NOTE — Telephone Encounter (Signed)
Reviewed [place] results with patient

## 2014-07-31 ENCOUNTER — Other Ambulatory Visit: Payer: Self-pay | Admitting: *Deleted

## 2014-07-31 MED ORDER — LEVOTHYROXINE SODIUM 75 MCG PO TABS: 75.0000 ug | ORAL_TABLET | Freq: Every day | ORAL | Status: DC

## 2014-07-31 NOTE — Telephone Encounter (Signed)
Refilled synthroid. Did not fill request for xanax at [facility] because patient has current script at [facility] just filled in April 2016 with 5 refills

## 2014-08-31 ENCOUNTER — Other Ambulatory Visit: Payer: Self-pay

## 2014-10-29 ENCOUNTER — Telehealth: Payer: Self-pay | Admitting: Internal Medicine

## 2014-10-29 NOTE — Telephone Encounter (Signed)
Patient was referred to [facility].    They called today with appointment information.  Patient to be seen on 11/03/14 @ 11:00 by Dr. Posey Pronto.  Patient has been informed.

## 2014-10-31 ENCOUNTER — Other Ambulatory Visit: Payer: Self-pay | Admitting: Internal Medicine

## 2014-11-03 ENCOUNTER — Other Ambulatory Visit: Payer: Self-pay | Admitting: Internal Medicine

## 2014-11-03 DIAGNOSIS — N2581 Secondary hyperparathyroidism of renal origin: Secondary | ICD-10-CM | POA: Diagnosis not present

## 2014-11-03 DIAGNOSIS — N183 Chronic kidney disease, stage 3 (moderate): Secondary | ICD-10-CM | POA: Diagnosis not present

## 2014-11-03 DIAGNOSIS — D631 Anemia in chronic kidney disease: Secondary | ICD-10-CM | POA: Diagnosis not present

## 2014-11-03 DIAGNOSIS — I129 Hypertensive chronic kidney disease with stage 1 through stage 4 chronic kidney disease, or unspecified chronic kidney disease: Secondary | ICD-10-CM | POA: Diagnosis not present

## 2014-11-03 NOTE — Telephone Encounter (Signed)
Refill x 6 months if not done already.

## 2014-11-04 NOTE — Telephone Encounter (Signed)
Verbal order by Dr. Renold Genta to refill Temazepam 15mg , w/5 refills.  LM on refill line @ [facility].

## 2014-11-13 NOTE — Telephone Encounter (Signed)
Phoned in 8/30.  Please close the encounter.  Thanks.

## 2014-11-16 ENCOUNTER — Ambulatory Visit (INDEPENDENT_AMBULATORY_CARE_PROVIDER_SITE_OTHER): Payer: Medicare Other | Admitting: Internal Medicine

## 2014-11-16 DIAGNOSIS — Z23 Encounter for immunization: Secondary | ICD-10-CM

## 2014-11-16 NOTE — Progress Notes (Signed)
Patient presents today for Prevnar injection. Patient tolerated injection well.

## 2014-11-23 ENCOUNTER — Emergency Department (HOSPITAL_COMMUNITY): Payer: Medicare Other

## 2014-11-23 ENCOUNTER — Encounter (HOSPITAL_COMMUNITY): Payer: Self-pay | Admitting: Emergency Medicine

## 2014-11-23 ENCOUNTER — Emergency Department (HOSPITAL_COMMUNITY)
Admission: EM | Admit: 2014-11-23 | Discharge: 2014-11-23 | Disposition: A | Discharge status: Home / Self Care | Payer: Medicare Other | Attending: Emergency Medicine | Admitting: Emergency Medicine

## 2014-11-23 DIAGNOSIS — Z7982 Long term (current) use of aspirin: Secondary | ICD-10-CM | POA: Diagnosis not present

## 2014-11-23 DIAGNOSIS — S2231XA Fracture of one rib, right side, initial encounter for closed fracture: Secondary | ICD-10-CM | POA: Insufficient documentation

## 2014-11-23 DIAGNOSIS — E559 Vitamin D deficiency, unspecified: Secondary | ICD-10-CM | POA: Insufficient documentation

## 2014-11-23 DIAGNOSIS — Z85820 Personal history of malignant melanoma of skin: Secondary | ICD-10-CM | POA: Insufficient documentation

## 2014-11-23 DIAGNOSIS — Y990 Civilian activity done for income or pay: Secondary | ICD-10-CM | POA: Insufficient documentation

## 2014-11-23 DIAGNOSIS — G479 Sleep disorder, unspecified: Secondary | ICD-10-CM | POA: Diagnosis not present

## 2014-11-23 DIAGNOSIS — Y9389 Activity, other specified: Secondary | ICD-10-CM | POA: Diagnosis not present

## 2014-11-23 DIAGNOSIS — S8991XA Unspecified injury of right lower leg, initial encounter: Secondary | ICD-10-CM | POA: Diagnosis present

## 2014-11-23 DIAGNOSIS — Z880 Allergy status to penicillin: Secondary | ICD-10-CM | POA: Insufficient documentation

## 2014-11-23 DIAGNOSIS — M7989 Other specified soft tissue disorders: Secondary | ICD-10-CM | POA: Diagnosis not present

## 2014-11-23 DIAGNOSIS — S90121A Contusion of right lesser toe(s) without damage to nail, initial encounter: Secondary | ICD-10-CM

## 2014-11-23 DIAGNOSIS — I10 Essential (primary) hypertension: Secondary | ICD-10-CM | POA: Diagnosis not present

## 2014-11-23 DIAGNOSIS — T148 Other injury of unspecified body region: Secondary | ICD-10-CM | POA: Diagnosis not present

## 2014-11-23 DIAGNOSIS — S90111A Contusion of right great toe without damage to nail, initial encounter: Secondary | ICD-10-CM | POA: Diagnosis not present

## 2014-11-23 DIAGNOSIS — S8011XA Contusion of right lower leg, initial encounter: Secondary | ICD-10-CM | POA: Insufficient documentation

## 2014-11-23 DIAGNOSIS — Y92410 Unspecified street and highway as the place of occurrence of the external cause: Secondary | ICD-10-CM | POA: Insufficient documentation

## 2014-11-23 DIAGNOSIS — Z79899 Other long term (current) drug therapy: Secondary | ICD-10-CM | POA: Insufficient documentation

## 2014-11-23 DIAGNOSIS — M79604 Pain in right leg: Secondary | ICD-10-CM | POA: Diagnosis not present

## 2014-11-23 MED ORDER — OXYCODONE-ACETAMINOPHEN 5-325 MG PO TABS: 1.0000 | ORAL_TABLET | Freq: Four times a day (QID) | ORAL | Status: DC | PRN

## 2014-11-23 MED ORDER — OXYCODONE-ACETAMINOPHEN 5-325 MG PO TABS
1.0000 | ORAL_TABLET | Freq: Once | ORAL | Status: AC
  Administered 2014-11-23: 1 via ORAL
  Filled 2014-11-23: qty 1

## 2014-11-23 MED ORDER — METHOCARBAMOL 500 MG PO TABS: 500.0000 mg | ORAL_TABLET | Freq: Three times a day (TID) | ORAL | Status: DC
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
## 2014-11-23 NOTE — Discharge Instructions (Signed)
Contusion A contusion is a deep bruise. Contusions happen when an injury causes bleeding under the skin. Signs of bruising include pain, puffiness (swelling), and discolored skin. The contusion may turn blue, purple, or yellow. HOME CARE   Put ice on the injured area.  Put ice in a plastic bag.  Place a towel between your skin and the bag.  Leave the ice on for 15-20 minutes, 03-04 times a day.  Only take medicine as told by your doctor.  Rest the injured area.  If possible, raise (elevate) the injured area to lessen puffiness. GET HELP RIGHT AWAY IF:   You have more bruising or puffiness.  You have pain that is getting worse.  Your puffiness or pain is not helped by medicine. MAKE SURE YOU:   Understand these instructions.  Will watch your condition.  Will get help right away if you are not doing well or get worse. Document Released: 08/09/2007 Document Revised: 05/15/2011 Document Reviewed: 12/26/2010 [facility] Patient Information 2015 [place], [place]. This information is not intended to replace advice given to you by your health care provider. Make sure you discuss any questions you have with your health care provider. As discussed her x-ray shows that you do have a ninth rib fracture that is minimally displaced.  You've been given and incentive spirometer use.  4.  Deep breathing exercises.  Please uses 3-4 times a day.  5  Breaths at a time slowly You've been given prescription for Percocet, Robaxin, which is for pain control and muscle relaxation. You also have a hematoma or deep bruise to your calf and foot.  This is been wrapped in an Ace bandage to help minimize swelling.  Please elevate her leg as much as possible for the next 1-2 days.  Apply ice as directed after 48 hours.  Use heat to help absorb the swelling. If you develop painful ambulation different from present, shortness of breath, rapid heart rate.  Anxiety.  Please return immediately for further evaluation to  rule out for a blood count in your leg or lung
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
## 2014-12-01 ENCOUNTER — Telehealth: Payer: Self-pay | Admitting: *Deleted

## 2014-12-01 ENCOUNTER — Ambulatory Visit (HOSPITAL_COMMUNITY)
Admission: RE | Admit: 2014-12-01 | Discharge: 2014-12-01 | Disposition: A | Discharge status: Home / Self Care | Payer: Medicare Other | Source: Ambulatory Visit | Attending: Internal Medicine | Admitting: Internal Medicine

## 2014-12-01 ENCOUNTER — Encounter: Payer: Self-pay | Admitting: Internal Medicine

## 2014-12-01 ENCOUNTER — Ambulatory Visit (INDEPENDENT_AMBULATORY_CARE_PROVIDER_SITE_OTHER): Payer: Medicare Other | Admitting: Internal Medicine

## 2014-12-01 VITALS — BP 114/70 | HR 56 | Temp 97.9°F | Ht 61.0 in | Wt 138.0 lb

## 2014-12-01 DIAGNOSIS — Z23 Encounter for immunization: Secondary | ICD-10-CM | POA: Diagnosis not present

## 2014-12-01 DIAGNOSIS — T148 Other injury of unspecified body region: Secondary | ICD-10-CM

## 2014-12-01 DIAGNOSIS — M7989 Other specified soft tissue disorders: Secondary | ICD-10-CM | POA: Insufficient documentation

## 2014-12-01 DIAGNOSIS — T148XXA Other injury of unspecified body region, initial encounter: Secondary | ICD-10-CM

## 2014-12-01 DIAGNOSIS — I10 Essential (primary) hypertension: Secondary | ICD-10-CM | POA: Insufficient documentation

## 2014-12-01 DIAGNOSIS — M79604 Pain in right leg: Secondary | ICD-10-CM | POA: Insufficient documentation

## 2014-12-01 NOTE — Progress Notes (Signed)
Preliminary results by tech - Right Lower Ext. Venous Duplex Completed. Negative for deep and superficial vein thrombosis in the right lower ext. Dr. Verlene Mayer office was notified.  Oda Cogan, BS, RDMS, RVT
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
## 2014-12-01 NOTE — Patient Instructions (Signed)
To have Doppler right lower extremity. Return in 2 weeks. Moist heat to right lower extremity 20 minutes twice daily. Take pain medication sparingly. Stay active about the house. Use spirometer.

## 2014-12-01 NOTE — Telephone Encounter (Signed)
Reviewed venous doppler results with patient

## 2014-12-11 ENCOUNTER — Ambulatory Visit (INDEPENDENT_AMBULATORY_CARE_PROVIDER_SITE_OTHER): Payer: Medicare Other | Admitting: Internal Medicine

## 2014-12-11 ENCOUNTER — Encounter: Payer: Self-pay | Admitting: Internal Medicine

## 2014-12-11 VITALS — BP 116/72 | HR 59 | Temp 98.0°F | Wt 138.0 lb

## 2014-12-11 DIAGNOSIS — S2231XS Fracture of one rib, right side, sequela: Secondary | ICD-10-CM

## 2014-12-11 DIAGNOSIS — S8011XD Contusion of right lower leg, subsequent encounter: Secondary | ICD-10-CM | POA: Diagnosis not present

## 2014-12-11 NOTE — Patient Instructions (Signed)
Reassured patient. Continue warm hot compresses to right lower leg. May take Tylenol with Percocet is causing constipation.
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
## 2014-12-15 DIAGNOSIS — L570 Actinic keratosis: Secondary | ICD-10-CM | POA: Diagnosis not present

## 2014-12-22 ENCOUNTER — Other Ambulatory Visit: Payer: Medicare Other | Admitting: Internal Medicine

## 2014-12-22 DIAGNOSIS — Z13220 Encounter for screening for lipoid disorders: Secondary | ICD-10-CM | POA: Diagnosis not present

## 2014-12-22 DIAGNOSIS — Z0000 Encounter for general adult medical examination without abnormal findings: Secondary | ICD-10-CM | POA: Diagnosis not present

## 2014-12-22 DIAGNOSIS — Z1329 Encounter for screening for other suspected endocrine disorder: Secondary | ICD-10-CM | POA: Diagnosis not present

## 2014-12-22 DIAGNOSIS — Z130 Encounter for screening for diseases of the blood and blood-forming organs and certain disorders involving the immune mechanism: Secondary | ICD-10-CM

## 2014-12-22 DIAGNOSIS — E559 Vitamin D deficiency, unspecified: Secondary | ICD-10-CM | POA: Diagnosis not present

## 2014-12-22 DIAGNOSIS — Z23 Encounter for immunization: Secondary | ICD-10-CM

## 2014-12-22 DIAGNOSIS — I10 Essential (primary) hypertension: Secondary | ICD-10-CM

## 2014-12-22 LAB — CBC WITH DIFFERENTIAL/PLATELET
BASOS ABS: 0 10*3/uL (ref 0.0–0.1)
BASOS PCT: 0 % (ref 0–1)
EOS ABS: 0.1 10*3/uL (ref 0.0–0.7)
Eosinophils Relative: 2 % (ref 0–5)
HCT: 37.4 % (ref 36.0–46.0)
Hemoglobin: 11.9 g/dL — ABNORMAL LOW (ref 12.0–15.0)
Lymphocytes Relative: 37 % (ref 12–46)
Lymphs Abs: 1.8 10*3/uL (ref 0.7–4.0)
MCH: 30.8 pg (ref 26.0–34.0)
MCHC: 31.8 g/dL (ref 30.0–36.0)
MCV: 96.9 fL (ref 78.0–100.0)
MPV: 9.5 fL (ref 8.6–12.4)
Monocytes Absolute: 0.4 10*3/uL (ref 0.1–1.0)
Monocytes Relative: 8 % (ref 3–12)
NEUTROS PCT: 53 % (ref 43–77)
Neutro Abs: 2.5 10*3/uL (ref 1.7–7.7)
PLATELETS: 162 10*3/uL (ref 150–400)
RBC: 3.86 MIL/uL — AB (ref 3.87–5.11)
RDW: 13.4 % (ref 11.5–15.5)
WBC: 4.8 10*3/uL (ref 4.0–10.5)

## 2014-12-22 LAB — LIPID PANEL
Cholesterol: 177 mg/dL (ref 125–200)
HDL: 76 mg/dL (ref >=46)
LDL CALC: 85 mg/dL (ref <130)
Total CHOL/HDL Ratio: 2.3 Ratio (ref <=5.0)
Triglycerides: 81 mg/dL (ref <150)
VLDL: 16 mg/dL (ref <30)

## 2014-12-22 LAB — COMPLETE METABOLIC PANEL WITH GFR
ALT: 10 U/L (ref 6–29)
AST: 16 U/L (ref 10–35)
Albumin: 4.3 g/dL (ref 3.6–5.1)
Alkaline Phosphatase: 74 U/L (ref 33–130)
BILIRUBIN TOTAL: 0.8 mg/dL (ref 0.2–1.2)
BUN: 17 mg/dL (ref 7–25)
CO2: 32 mmol/L — AB (ref 20–31)
CREATININE: 1.12 mg/dL — AB (ref 0.60–0.93)
Calcium: 9.9 mg/dL (ref 8.6–10.4)
Chloride: 100 mmol/L (ref 98–110)
GFR, EST NON AFRICAN AMERICAN: 49 mL/min — AB (ref >=60)
GFR, Est African American: 57 mL/min — ABNORMAL LOW (ref >=60)
GLUCOSE: 89 mg/dL (ref 65–99)
Potassium: 4.4 mmol/L (ref 3.5–5.3)
SODIUM: 141 mmol/L (ref 135–146)
TOTAL PROTEIN: 6.7 g/dL (ref 6.1–8.1)

## 2014-12-23 LAB — TSH: TSH: 1.949 u[IU]/mL (ref 0.350–4.500)

## 2014-12-23 LAB — VITAMIN D 25 HYDROXY (VIT D DEFICIENCY, FRACTURES): Vit D, 25-Hydroxy: 41 ng/mL (ref 30–100)

## 2014-12-24 ENCOUNTER — Encounter: Payer: Self-pay | Admitting: Internal Medicine

## 2014-12-24 ENCOUNTER — Ambulatory Visit (INDEPENDENT_AMBULATORY_CARE_PROVIDER_SITE_OTHER): Payer: Medicare Other | Admitting: Internal Medicine

## 2014-12-24 VITALS — BP 118/72 | HR 67 | Temp 97.3°F | Resp 16 | Ht 61.0 in | Wt 138.0 lb

## 2014-12-24 DIAGNOSIS — N181 Chronic kidney disease, stage 1: Secondary | ICD-10-CM | POA: Diagnosis not present

## 2014-12-24 DIAGNOSIS — F411 Generalized anxiety disorder: Secondary | ICD-10-CM | POA: Diagnosis not present

## 2014-12-24 DIAGNOSIS — E039 Hypothyroidism, unspecified: Secondary | ICD-10-CM | POA: Diagnosis not present

## 2014-12-24 DIAGNOSIS — I10 Essential (primary) hypertension: Secondary | ICD-10-CM | POA: Diagnosis not present

## 2014-12-24 DIAGNOSIS — F341 Dysthymic disorder: Secondary | ICD-10-CM | POA: Diagnosis not present

## 2014-12-24 DIAGNOSIS — Z8639 Personal history of other endocrine, nutritional and metabolic disease: Secondary | ICD-10-CM

## 2014-12-24 DIAGNOSIS — C541 Malignant neoplasm of endometrium: Secondary | ICD-10-CM | POA: Diagnosis not present

## 2014-12-24 DIAGNOSIS — Z0000 Encounter for general adult medical examination without abnormal findings: Secondary | ICD-10-CM

## 2014-12-24 DIAGNOSIS — E785 Hyperlipidemia, unspecified: Secondary | ICD-10-CM | POA: Diagnosis not present

## 2014-12-24 DIAGNOSIS — R0989 Other specified symptoms and signs involving the circulatory and respiratory systems: Secondary | ICD-10-CM

## 2014-12-24 DIAGNOSIS — M4806 Spinal stenosis, lumbar region: Secondary | ICD-10-CM

## 2014-12-24 DIAGNOSIS — Z85820 Personal history of malignant melanoma of skin: Secondary | ICD-10-CM | POA: Diagnosis not present

## 2014-12-24 DIAGNOSIS — M48061 Spinal stenosis, lumbar region without neurogenic claudication: Secondary | ICD-10-CM

## 2014-12-24 LAB — POCT URINALYSIS DIPSTICK
BILIRUBIN UA: NEGATIVE
Blood, UA: NEGATIVE
GLUCOSE UA: NEGATIVE
Ketones, UA: NEGATIVE
Leukocytes, UA: NEGATIVE
Nitrite, UA: NEGATIVE
Protein, UA: NEGATIVE
UROBILINOGEN UA: 0.2
pH, UA: 6

## 2014-12-28 ENCOUNTER — Other Ambulatory Visit: Payer: Self-pay | Admitting: Internal Medicine

## 2015-01-04 ENCOUNTER — Encounter: Payer: Self-pay | Admitting: Internal Medicine
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
## 2015-01-27 DIAGNOSIS — N2581 Secondary hyperparathyroidism of renal origin: Secondary | ICD-10-CM | POA: Diagnosis not present

## 2015-01-27 DIAGNOSIS — N183 Chronic kidney disease, stage 3 (moderate): Secondary | ICD-10-CM | POA: Diagnosis not present

## 2015-01-27 DIAGNOSIS — D631 Anemia in chronic kidney disease: Secondary | ICD-10-CM | POA: Diagnosis not present

## 2015-01-27 DIAGNOSIS — I129 Hypertensive chronic kidney disease with stage 1 through stage 4 chronic kidney disease, or unspecified chronic kidney disease: Secondary | ICD-10-CM | POA: Diagnosis not present

## 2015-01-29 ENCOUNTER — Other Ambulatory Visit: Payer: Self-pay | Admitting: Internal Medicine

## 2015-01-29 NOTE — Telephone Encounter (Signed)
Call in refill x 6 months 

## 2015-02-01 ENCOUNTER — Other Ambulatory Visit: Payer: Self-pay

## 2015-02-01 MED ORDER — ALPRAZOLAM 0.25 MG PO TABS: 0.2500 mg | ORAL_TABLET | Freq: Two times a day (BID) | ORAL | Status: DC | PRN

## 2015-02-11 DIAGNOSIS — Z1231 Encounter for screening mammogram for malignant neoplasm of breast: Secondary | ICD-10-CM | POA: Diagnosis not present

## 2015-03-09 ENCOUNTER — Encounter: Payer: Self-pay | Admitting: Internal Medicine

## 2015-03-09 ENCOUNTER — Ambulatory Visit (INDEPENDENT_AMBULATORY_CARE_PROVIDER_SITE_OTHER): Payer: Medicare Other | Admitting: Internal Medicine

## 2015-03-09 VITALS — BP 128/60 | HR 60 | Temp 98.3°F | Wt 140.0 lb

## 2015-03-09 DIAGNOSIS — J0100 Acute maxillary sinusitis, unspecified: Secondary | ICD-10-CM | POA: Diagnosis not present

## 2015-03-09 MED ORDER — CEFTRIAXONE SODIUM 1 G IJ SOLR
1.0000 g | Freq: Once | INTRAMUSCULAR | Status: AC
  Administered 2015-03-09: 1 g via INTRAMUSCULAR

## 2015-03-09 MED ORDER — HYDROCODONE-HOMATROPINE 5-1.5 MG/5ML PO SYRP: 5.0000 mL | ORAL_SOLUTION | Freq: Three times a day (TID) | ORAL | Status: DC | PRN

## 2015-03-09 MED ORDER — CLARITHROMYCIN 500 MG PO TABS: 500.0000 mg | ORAL_TABLET | Freq: Two times a day (BID) | ORAL | Status: DC

## 2015-03-12 ENCOUNTER — Ambulatory Visit
Admission: RE | Admit: 2015-03-12 | Discharge: 2015-03-12 | Disposition: A | Discharge status: Home / Self Care | Payer: Medicare Other | Source: Ambulatory Visit | Attending: Internal Medicine | Admitting: Internal Medicine

## 2015-03-12 ENCOUNTER — Encounter: Payer: Self-pay | Admitting: Internal Medicine

## 2015-03-12 ENCOUNTER — Ambulatory Visit (INDEPENDENT_AMBULATORY_CARE_PROVIDER_SITE_OTHER): Payer: Medicare Other | Admitting: Internal Medicine

## 2015-03-12 ENCOUNTER — Telehealth: Payer: Self-pay

## 2015-03-12 VITALS — BP 126/78 | HR 69 | Temp 97.5°F | Resp 20 | Wt 142.0 lb

## 2015-03-12 DIAGNOSIS — R05 Cough: Secondary | ICD-10-CM

## 2015-03-12 DIAGNOSIS — J209 Acute bronchitis, unspecified: Secondary | ICD-10-CM | POA: Diagnosis not present

## 2015-03-12 DIAGNOSIS — R059 Cough, unspecified: Secondary | ICD-10-CM

## 2015-03-12 DIAGNOSIS — F411 Generalized anxiety disorder: Secondary | ICD-10-CM

## 2015-03-12 MED ORDER — CEFTRIAXONE SODIUM 1 G IJ SOLR
1.0000 g | INTRAMUSCULAR | Status: AC
  Administered 2015-03-12: 1 g via INTRAMUSCULAR

## 2015-03-12 MED ORDER — PREDNISONE 10 MG PO TABS: ORAL_TABLET | ORAL | Status: DC

## 2015-03-12 MED ORDER — ALBUTEROL SULFATE HFA 108 (90 BASE) MCG/ACT IN AERS: 2.0000 | INHALATION_SPRAY | Freq: Four times a day (QID) | RESPIRATORY_TRACT | Status: DC | PRN

## 2015-03-12 NOTE — Patient Instructions (Addendum)
Chest x-ray is negative. Sterapred DS 10 mg 6 day dosepak. Ventolin inhaler 2 sprays by mouth 4 times daily. Continue Hycodan sparingly. Rest and drink plenty of fluids. Finish course of Biaxin. Rocephin 1 g IM.

## 2015-03-12 NOTE — Progress Notes (Signed)
   Subjective:    Patient ID: Melinda Payne, female    DOB: Nov 27, 1940, 75 y.o.   MRN: [id number]  HPI Seen January 3 and diagnosed with acute maxillary sinusitis treated with Biaxin and Hycodan. Pt says she has not been anywhere. Taking Mucinex and has no energy. Has no nausea or vomiting. Coughing up yellow sputum. Was also given one gram IM Rocephin on January 3rd.    Review of Systems see above     Objective:   Physical Exam  Constitutional:  Looks fatigued  Eyes: Conjunctivae are normal. Pupils are equal, round, and reactive to light.  Left TM chronically scarred. Right TM OK  Neck: Neck supple. No JVD present. No thyromegaly present.  Cardiovascular: Normal rate and normal heart sounds.   Pulmonary/Chest: No respiratory distress. She has no rales.  Wheezing left chest. Pulse ox normal          Assessment & Plan:  Acute bronchitis  Anxiety  Essential hypertension  Plan: Chest x-ray performed and is within normal limits. No infiltrate noted. 1 g IM Rocephin. Finish course of Biaxin. Sterapred DS 10 mg 6 day dosepak take as directed. Ventolin inhaler 2 sprays by mouth 4 times daily. Rest and drink plenty of fluids.

## 2015-03-12 NOTE — Telephone Encounter (Signed)
Come back today for reassessment

## 2015-03-12 NOTE — Telephone Encounter (Signed)
Patient contacted office and states that she is not doing any better and she is fact feeling worse. She states that she wants to know if she should be taking the mucinex plain of the mucinex DM, and also questions an inhaler. Please advise.

## 2015-04-03 ENCOUNTER — Encounter: Payer: Self-pay | Admitting: Internal Medicine

## 2015-04-03 NOTE — Progress Notes (Signed)
   Subjective:    Patient ID: Melinda Payne, female    DOB: Jul 05, 1940, 75 y.o.   MRN: [id number]  HPI Apparently came down with a respiratory infection over the holidays and has not been able to get better. No fever or shaking chills. Has had cough and congestion. History of anxiety. Anxious about her health.    Review of Systems     Objective:   Physical Exam  Skin warm and dry. Nodes none. TMs and pharynx are clear. Neck is supple. Chest clear to auscultation without rales or wheezing.      Assessment & Plan:  Acute URI  Maxillary sinusitis  Plan: Biaxin 500 mg twice daily for 10 days. Hycodan 1 teaspoon by mouth every 8 hours when necessary cough. 1 g IM Rocephin given.

## 2015-04-03 NOTE — Patient Instructions (Addendum)
Biaxin 500 mg twice daily for 10 days. Hycodan 1 teaspoon by mouth every 8 hours when necessary cough. Rest and drink plenty of fluids. 1 g IM Rocephin given.

## 2015-05-07 ENCOUNTER — Ambulatory Visit (INDEPENDENT_AMBULATORY_CARE_PROVIDER_SITE_OTHER): Payer: Medicare Other | Admitting: Internal Medicine

## 2015-05-07 ENCOUNTER — Encounter: Payer: Self-pay | Admitting: Internal Medicine

## 2015-05-07 VITALS — BP 128/68 | HR 64 | Temp 97.8°F | Resp 20 | Ht 61.0 in | Wt 144.0 lb

## 2015-05-07 DIAGNOSIS — J209 Acute bronchitis, unspecified: Secondary | ICD-10-CM | POA: Diagnosis not present

## 2015-05-07 MED ORDER — PREDNISONE 10 MG PO TABS: ORAL_TABLET | ORAL | Status: DC

## 2015-05-07 MED ORDER — CLARITHROMYCIN 500 MG PO TABS: 500.0000 mg | ORAL_TABLET | Freq: Two times a day (BID) | ORAL | Status: DC

## 2015-05-07 MED ORDER — CEFTRIAXONE SODIUM 1 G IJ SOLR
1.0000 g | INTRAMUSCULAR | Status: AC
  Administered 2015-05-07: 1 g via INTRAMUSCULAR

## 2015-05-07 NOTE — Patient Instructions (Signed)
Use inhaler as directed. You have cough syrup at home. Rest and drink plenty of fluids. Rocephin 1 g IM. Biaxin 500 mg twice daily for 10 days. Sterapred DS 10 mg 6 day dosepak.

## 2015-05-07 NOTE — Progress Notes (Signed)
   Subjective:    Patient ID: Melinda Payne, female    DOB: 1940-06-30, 75 y.o.   MRN: [id number]  HPI Patient went to church this past Sunday and came home and was quite cold. Had chills that day. Subsequently developed respiratory congestion with cough. Has slight discolored sputum. Has nasal congestion but no frank nasal discharge that is discolored. No documented fever. No nausea vomiting or diarrhea. Says lots of folks at church were sick.    Review of Systems see above     Objective:   Physical Exam  Skin warm and dry. Sounds nasally congested when she speaks. TMs are clear. Pharynx is clear. Neck is supple without JVD thyromegaly or adenopathy. Chest clear to auscultation without rales or wheezing.      Assessment & Plan:  Acute bronchitis  Plan: Rocephin 1 g IM. Biaxin 500 mg twice daily for 10 days. Has Hycodan on hand for cough. Sterapred DS 10 mg 6 day dosepak. This worked for her in early January after protracted respiratory infection for several weeks. Use albuterol inhaler.

## 2015-05-11 ENCOUNTER — Telehealth: Payer: Self-pay | Admitting: Internal Medicine

## 2015-05-11 NOTE — Telephone Encounter (Signed)
She's been on Biaxin and Prednisone.  She's never had vertigo before.  She went to the bathroom on Sunday and she had an episode where she felt faint.  She grabbed the door and just sort of fell down the wall.  The next day the same thing happened and and she fell down the wall and caught herself.  Today, she hasn't fallen.  She gave blood 04/22/15 and wonders if it has any relation at all?    Spoke with Dr. Renold Genta and she advised that patient may not have been drinking enough fluids.  She would like for patient to come in and be seen tomorrow.  Patient will need to have CBC w/diff, BMET and orthostatic blood pressures.   Appointment given to patient for Wednesday, 3/8 @ 11:30 and confirmed.

## 2015-05-12 ENCOUNTER — Encounter: Payer: Self-pay | Admitting: Internal Medicine

## 2015-05-12 ENCOUNTER — Ambulatory Visit (INDEPENDENT_AMBULATORY_CARE_PROVIDER_SITE_OTHER): Payer: Medicare Other | Admitting: Internal Medicine

## 2015-05-12 VITALS — Temp 97.8°F | Resp 18 | Wt 146.0 lb

## 2015-05-12 DIAGNOSIS — I10 Essential (primary) hypertension: Secondary | ICD-10-CM | POA: Diagnosis not present

## 2015-05-12 DIAGNOSIS — E860 Dehydration: Secondary | ICD-10-CM | POA: Diagnosis not present

## 2015-05-12 DIAGNOSIS — I951 Orthostatic hypotension: Secondary | ICD-10-CM | POA: Diagnosis not present

## 2015-05-12 DIAGNOSIS — E785 Hyperlipidemia, unspecified: Secondary | ICD-10-CM | POA: Diagnosis not present

## 2015-05-12 DIAGNOSIS — E039 Hypothyroidism, unspecified: Secondary | ICD-10-CM | POA: Diagnosis not present

## 2015-05-12 DIAGNOSIS — J069 Acute upper respiratory infection, unspecified: Secondary | ICD-10-CM

## 2015-05-12 DIAGNOSIS — F411 Generalized anxiety disorder: Secondary | ICD-10-CM

## 2015-05-12 NOTE — Patient Instructions (Signed)
Patient will stop amlodipine. Continue other medications as previously prescribed. Monitor blood pressure at home. Increase activity about the house. Lab work is pending.

## 2015-05-12 NOTE — Progress Notes (Signed)
   Subjective:    Patient ID: Melinda Payne, female    DOB: 09/17/1940, 75 y.o.   MRN: [id number]  HPI Patient says she's had 3 episodes of the past several days where she was standing and felt suddenly weak as if she might have syncopal episode. Did not actually have syncope. She's been taking Biaxin for recent respiratory infection. Has taken it before without any side effects. She's been resting at home. She's not been doing a lot of activity about the house. She wants to go the beach this weekend but doesn't quite feel up to it. She is frustrated that she's been sick a lot this one to her and that she's not getting better quickly. She had no chest pain. No nausea and vomiting. Says she's been staying well hydrated drinking lots of fluids. She is wondering if lipid medication could be causing the symptoms. She has a history of insomnia and anxiety. History of hypothyroidism, essential hypertension, hyperlipidemia.    Review of Systems as above     Objective:   Physical Exam Skin warm and dry. She looks fatigued. Blood pressure lying is 138/70 pulse 68 blood pressure sitting 126/78 pulse 71 blood pressure standing 124/68 with pulse of 74. Neck is supple without JVD thyromegaly or carotid bruits. Chest clear to auscultation. Cardiac exam regular rate and rhythm normal S1 and S2. Extremities without edema. She is alert and oriented 3       Assessment & Plan:  Orthostatic hypotension  Recent URI-slowly improving  Essential hypertension  Hyperlipidemia  Anxiety  Insomnia  Hypothyroidism  Plan: Patient will discontinue amlodipine 5 mg daily. Will continue Tenormin. Will continue HCTZ Finish course of Biaxin. Will try to sit in chair for several hours daily and do light housework. She will monitor blood pressure at home. CBC and basic metabolic panel drawn today.

## 2015-05-13 ENCOUNTER — Ambulatory Visit
Admission: RE | Admit: 2015-05-13 | Discharge: 2015-05-13 | Disposition: A | Discharge status: Home / Self Care | Payer: Medicare Other | Source: Ambulatory Visit | Attending: Internal Medicine | Admitting: Internal Medicine

## 2015-05-13 ENCOUNTER — Other Ambulatory Visit: Payer: Self-pay | Admitting: Internal Medicine

## 2015-05-13 DIAGNOSIS — J069 Acute upper respiratory infection, unspecified: Secondary | ICD-10-CM

## 2015-05-13 DIAGNOSIS — I951 Orthostatic hypotension: Secondary | ICD-10-CM

## 2015-05-13 DIAGNOSIS — R918 Other nonspecific abnormal finding of lung field: Secondary | ICD-10-CM | POA: Diagnosis not present

## 2015-05-13 LAB — CBC WITH DIFFERENTIAL/PLATELET
BASOS PCT: 0 % (ref 0–1)
Basophils Absolute: 0 10*3/uL (ref 0.0–0.1)
Eosinophils Absolute: 0 10*3/uL (ref 0.0–0.7)
Eosinophils Relative: 0 % (ref 0–5)
HEMATOCRIT: 38.4 % (ref 36.0–46.0)
HEMOGLOBIN: 12.6 g/dL (ref 12.0–15.0)
LYMPHS PCT: 16 % (ref 12–46)
Lymphs Abs: 2.6 10*3/uL (ref 0.7–4.0)
MCH: 29.2 pg (ref 26.0–34.0)
MCHC: 32.8 g/dL (ref 30.0–36.0)
MCV: 88.9 fL (ref 78.0–100.0)
MPV: 9.3 fL (ref 8.6–12.4)
Monocytes Absolute: 1 10*3/uL (ref 0.1–1.0)
Monocytes Relative: 6 % (ref 3–12)
NEUTROS ABS: 12.5 10*3/uL — AB (ref 1.7–7.7)
NEUTROS PCT: 78 % — AB (ref 43–77)
Platelets: 325 10*3/uL (ref 150–400)
RBC: 4.32 MIL/uL (ref 3.87–5.11)
RDW: 13.9 % (ref 11.5–15.5)
WBC: 16 10*3/uL — AB (ref 4.0–10.5)

## 2015-05-13 LAB — BASIC METABOLIC PANEL
BUN: 24 mg/dL (ref 7–25)
CHLORIDE: 96 mmol/L — AB (ref 98–110)
CO2: 25 mmol/L (ref 20–31)
Calcium: 10.2 mg/dL (ref 8.6–10.4)
Creat: 1.22 mg/dL — ABNORMAL HIGH (ref 0.60–0.93)
GLUCOSE: 109 mg/dL — AB (ref 65–99)
POTASSIUM: 5.3 mmol/L (ref 3.5–5.3)
SODIUM: 138 mmol/L (ref 135–146)

## 2015-05-13 NOTE — Telephone Encounter (Signed)
Refill x 6 months 

## 2015-05-13 NOTE — Telephone Encounter (Signed)
Phoned to pharmacy 

## 2015-05-13 NOTE — Addendum Note (Signed)
Addended by: Beryle Quant on: 05/13/2015 10:25 AM   Modules accepted: Orders

## 2015-05-14 ENCOUNTER — Other Ambulatory Visit (INDEPENDENT_AMBULATORY_CARE_PROVIDER_SITE_OTHER): Payer: Medicare Other | Admitting: Internal Medicine

## 2015-05-14 DIAGNOSIS — D72829 Elevated white blood cell count, unspecified: Secondary | ICD-10-CM | POA: Diagnosis not present

## 2015-05-14 LAB — CBC WITH DIFFERENTIAL/PLATELET
BASOS PCT: 0 % (ref 0–1)
Basophils Absolute: 0 10*3/uL (ref 0.0–0.1)
Eosinophils Absolute: 0 10*3/uL (ref 0.0–0.7)
Eosinophils Relative: 0 % (ref 0–5)
HEMATOCRIT: 35.7 % — AB (ref 36.0–46.0)
HEMOGLOBIN: 12.1 g/dL (ref 12.0–15.0)
LYMPHS PCT: 38 % (ref 12–46)
Lymphs Abs: 5.1 10*3/uL — ABNORMAL HIGH (ref 0.7–4.0)
MCH: 29.2 pg (ref 26.0–34.0)
MCHC: 33.9 g/dL (ref 30.0–36.0)
MCV: 86.2 fL (ref 78.0–100.0)
MONO ABS: 1.3 10*3/uL — AB (ref 0.1–1.0)
MONOS PCT: 10 % (ref 3–12)
MPV: 8.8 fL (ref 8.6–12.4)
NEUTROS ABS: 7 10*3/uL (ref 1.7–7.7)
NEUTROS PCT: 52 % (ref 43–77)
Platelets: 349 10*3/uL (ref 150–400)
RBC: 4.14 MIL/uL (ref 3.87–5.11)
RDW: 13.5 % (ref 11.5–15.5)
WBC: 13.4 10*3/uL — ABNORMAL HIGH (ref 4.0–10.5)

## 2015-05-14 LAB — POCT URINALYSIS DIPSTICK
Bilirubin, UA: NEGATIVE
Blood, UA: NEGATIVE
GLUCOSE UA: NEGATIVE
KETONES UA: NEGATIVE
LEUKOCYTES UA: NEGATIVE
Nitrite, UA: NEGATIVE
PROTEIN UA: NEGATIVE
SPEC GRAV UA: 1.02
Urobilinogen, UA: 0.2
pH, UA: 7.5

## 2015-05-14 NOTE — Addendum Note (Signed)
Addended by: Beryle Quant on: 05/14/2015 12:40 PM   Modules accepted: Orders

## 2015-05-22 ENCOUNTER — Other Ambulatory Visit: Payer: Self-pay | Admitting: Internal Medicine

## 2015-05-28 ENCOUNTER — Other Ambulatory Visit: Payer: Self-pay | Admitting: Internal Medicine

## 2015-05-29 ENCOUNTER — Telehealth: Payer: Self-pay | Admitting: Internal Medicine

## 2015-05-29 MED ORDER — HYDROCHLOROTHIAZIDE 25 MG PO TABS: ORAL_TABLET | ORAL | Status: DC

## 2015-05-29 NOTE — Telephone Encounter (Signed)
[facility] requesting refill on HCTZ. Refill for one year.

## 2015-05-31 MED ORDER — HYDROCHLOROTHIAZIDE 25 MG PO TABS: ORAL_TABLET | ORAL | Status: DC

## 2015-05-31 NOTE — Addendum Note (Signed)
Addended by: Beryle Quant on: 05/31/2015 05:03 PM   Modules accepted: Orders

## 2015-06-02 ENCOUNTER — Other Ambulatory Visit: Payer: Self-pay

## 2015-06-02 MED ORDER — SIMVASTATIN 10 MG PO TABS
10.0000 mg | ORAL_TABLET | Freq: Every day | ORAL | Status: DC
Start: 2015-06-02 — End: 2015-11-01

## 2015-06-02 NOTE — Telephone Encounter (Signed)
Prescription faxed to pharmacy.

## 2015-06-14 ENCOUNTER — Telehealth: Payer: Self-pay | Admitting: Internal Medicine

## 2015-06-14 MED ORDER — TEMAZEPAM 15 MG PO CAPS: ORAL_CAPSULE | ORAL | Status: DC

## 2015-06-14 NOTE — Telephone Encounter (Signed)
Patient notified- need to refill temazepam

## 2015-06-14 NOTE — Telephone Encounter (Signed)
I doubt she will be able to sleep without it. There is a rebound insomnia that occurs when one tries to stop taking sleep medication like this after taking it nightly for some time. She can try every other night and see how it goes for 2-4 weeks then  cut down to a couple of times a week for a couple of weeks then go off of it. May take 4-6 weeks to get off of it.

## 2015-06-14 NOTE — Addendum Note (Signed)
Addended by: Beryle Quant on: 06/14/2015 04:47 PM   Modules accepted: Orders

## 2015-06-14 NOTE — Telephone Encounter (Signed)
She has been on Temazepam 15mg  qhs x 20 years+.  She last talked with you about stopping this.  She wants to know if she can just stop this without tapering off of it?    Please advise.

## 2015-06-25 ENCOUNTER — Other Ambulatory Visit: Payer: Self-pay

## 2015-06-25 MED ORDER — LEVOTHYROXINE SODIUM 75 MCG PO TABS: 75.0000 ug | ORAL_TABLET | Freq: Every day | ORAL | Status: DC

## 2015-06-28 ENCOUNTER — Telehealth: Payer: Self-pay | Admitting: Internal Medicine

## 2015-06-28 MED ORDER — LEVOTHYROXINE SODIUM 75 MCG PO TABS: 75.0000 ug | ORAL_TABLET | Freq: Every day | ORAL | Status: DC

## 2015-06-28 NOTE — Telephone Encounter (Signed)
Ms. Sudler called saying her pharmacy sent [place] a request for a refill of Synthroid and heard no response. After looking in her chart, I told her the Rx was sent to [facility]. She said that particular Rx usually goes to [facility] and she's completely out. She's "fine" with the Rx having been sent to [facility] this time but she's wondering if a Rx can be sent to [facility] also so she'll have enough medication to last until the mail package comes in from [facility]. Please call the pt.  Pt's ph# [phone] Thank you.

## 2015-06-29 ENCOUNTER — Other Ambulatory Visit: Payer: Medicare Other | Admitting: Internal Medicine

## 2015-06-29 ENCOUNTER — Encounter: Payer: Self-pay | Admitting: Internal Medicine

## 2015-06-29 DIAGNOSIS — I10 Essential (primary) hypertension: Secondary | ICD-10-CM

## 2015-06-29 DIAGNOSIS — E785 Hyperlipidemia, unspecified: Secondary | ICD-10-CM

## 2015-06-29 DIAGNOSIS — M85852 Other specified disorders of bone density and structure, left thigh: Secondary | ICD-10-CM | POA: Diagnosis not present

## 2015-06-29 DIAGNOSIS — M85851 Other specified disorders of bone density and structure, right thigh: Secondary | ICD-10-CM | POA: Diagnosis not present

## 2015-06-29 DIAGNOSIS — Z79899 Other long term (current) drug therapy: Secondary | ICD-10-CM

## 2015-06-29 DIAGNOSIS — E039 Hypothyroidism, unspecified: Secondary | ICD-10-CM

## 2015-06-29 LAB — BASIC METABOLIC PANEL
BUN: 18 mg/dL (ref 7–25)
CO2: 31 mmol/L (ref 20–31)
Calcium: 9.9 mg/dL (ref 8.6–10.4)
Chloride: 100 mmol/L (ref 98–110)
Creat: 1.25 mg/dL — ABNORMAL HIGH (ref 0.60–0.93)
Glucose, Bld: 89 mg/dL (ref 65–99)
POTASSIUM: 3.7 mmol/L (ref 3.5–5.3)
SODIUM: 139 mmol/L (ref 135–146)

## 2015-06-29 LAB — LIPID PANEL
CHOL/HDL RATIO: 1.7 ratio (ref <=5.0)
CHOLESTEROL: 177 mg/dL (ref 125–200)
HDL: 102 mg/dL (ref >=46)
LDL Cholesterol: 63 mg/dL (ref <130)
TRIGLYCERIDES: 59 mg/dL (ref <150)
VLDL: 12 mg/dL (ref <30)

## 2015-06-29 LAB — HEPATIC FUNCTION PANEL
ALBUMIN: 4.5 g/dL (ref 3.6–5.1)
ALT: 10 U/L (ref 6–29)
AST: 18 U/L (ref 10–35)
Alkaline Phosphatase: 62 U/L (ref 33–130)
Bilirubin, Direct: 0.1 mg/dL (ref <=0.2)
Indirect Bilirubin: 0.5 mg/dL (ref 0.2–1.2)
TOTAL PROTEIN: 7.1 g/dL (ref 6.1–8.1)
Total Bilirubin: 0.6 mg/dL (ref 0.2–1.2)

## 2015-06-29 LAB — TSH: TSH: 1.05 m[IU]/L

## 2015-07-01 ENCOUNTER — Ambulatory Visit (INDEPENDENT_AMBULATORY_CARE_PROVIDER_SITE_OTHER): Payer: Medicare Other | Admitting: Internal Medicine

## 2015-07-01 ENCOUNTER — Encounter: Payer: Self-pay | Admitting: Internal Medicine

## 2015-07-01 VITALS — BP 132/70 | HR 88 | Temp 97.9°F | Resp 20 | Ht 61.0 in | Wt 144.0 lb

## 2015-07-01 DIAGNOSIS — E785 Hyperlipidemia, unspecified: Secondary | ICD-10-CM

## 2015-07-01 DIAGNOSIS — G4700 Insomnia, unspecified: Secondary | ICD-10-CM

## 2015-07-01 DIAGNOSIS — E039 Hypothyroidism, unspecified: Secondary | ICD-10-CM

## 2015-07-01 DIAGNOSIS — I10 Essential (primary) hypertension: Secondary | ICD-10-CM

## 2015-07-01 DIAGNOSIS — N183 Chronic kidney disease, stage 3 unspecified: Secondary | ICD-10-CM

## 2015-07-01 DIAGNOSIS — M8580 Other specified disorders of bone density and structure, unspecified site: Secondary | ICD-10-CM | POA: Diagnosis not present

## 2015-07-01 DIAGNOSIS — Z86010 Personal history of colonic polyps: Secondary | ICD-10-CM

## 2015-07-01 NOTE — Patient Instructions (Signed)
Continue same medications and return in 6 months for physical exam. It was a pleasure to see you today. Please call GI office to see when you need another colonoscopy.

## 2015-07-01 NOTE — Progress Notes (Signed)
   Subjective:    Patient ID: Melinda Payne, female    DOB: Aug 02, 1940, 75 y.o.   MRN: [id number]  HPI Recent bone density study shows mild osteopenia. Continue vitamin D supplement. This was reviewed with her today. She is wondering about when she needs another colonoscopy. Old chart indicates she had colonoscopy in March 2011. She will call Dr. Osborn Coho  office. History of tubular adenoma in 2000. She has got never respiratory infections. Beginning to feel better. Going to [place] this Summer.  With regard to renal functions. Creatinine still elevated at 1.25 but stable. Lipid panel and liver functions are within normal limits. TSH is normal. Blood pressure under good control.    Review of Systems as above-no new complaints     Objective:   Physical Exam Skin warm and dry. Nodes none. Neck is supple without JVD thyromegaly or carotid bruits. Chest clear to auscultation. Cardiac exam regular rate and rhythm normal S1 and S2. Extremities without edema.       Assessment & Plan:  Essential hypertension-stable  Osteopenia-continue vitamin D supplement  History of tubular adenoma- patient to call gastroenterology office to find out when she needs to return  Chronic kidney disease-creatinine stable at 1.25  Hypothyroidism-TSH within normal limits on current dose of thyroid replacement  Hyperlipidemia-lipid panel liver functions are normal  Plan: Patient will return October 2017 for physical examination. Continue same medications.

## 2015-07-08 DIAGNOSIS — S0502XA Injury of conjunctiva and corneal abrasion without foreign body, left eye, initial encounter: Secondary | ICD-10-CM | POA: Diagnosis not present

## 2015-07-08 DIAGNOSIS — H04123 Dry eye syndrome of bilateral lacrimal glands: Secondary | ICD-10-CM | POA: Diagnosis not present

## 2015-07-12 DIAGNOSIS — H04123 Dry eye syndrome of bilateral lacrimal glands: Secondary | ICD-10-CM | POA: Diagnosis not present

## 2015-07-12 DIAGNOSIS — S0502XD Injury of conjunctiva and corneal abrasion without foreign body, left eye, subsequent encounter: Secondary | ICD-10-CM | POA: Diagnosis not present

## 2015-07-26 ENCOUNTER — Ambulatory Visit: Payer: Self-pay | Admitting: Internal Medicine

## 2015-07-26 ENCOUNTER — Encounter: Payer: Self-pay | Admitting: Internal Medicine

## 2015-07-26 ENCOUNTER — Ambulatory Visit (INDEPENDENT_AMBULATORY_CARE_PROVIDER_SITE_OTHER): Payer: Medicare Other | Admitting: Internal Medicine

## 2015-07-26 VITALS — BP 130/78 | HR 57 | Temp 98.1°F | Resp 18 | Wt 143.5 lb

## 2015-07-26 DIAGNOSIS — F411 Generalized anxiety disorder: Secondary | ICD-10-CM | POA: Diagnosis not present

## 2015-07-26 DIAGNOSIS — J069 Acute upper respiratory infection, unspecified: Secondary | ICD-10-CM

## 2015-07-26 DIAGNOSIS — G4700 Insomnia, unspecified: Secondary | ICD-10-CM | POA: Diagnosis not present

## 2015-07-26 MED ORDER — PREDNISONE 10 MG PO TABS: ORAL_TABLET | ORAL | Status: DC

## 2015-07-26 MED ORDER — CLARITHROMYCIN 500 MG PO TABS: 500.0000 mg | ORAL_TABLET | Freq: Two times a day (BID) | ORAL | Status: DC

## 2015-07-26 MED ORDER — ALPRAZOLAM 0.25 MG PO TABS: 0.2500 mg | ORAL_TABLET | Freq: Two times a day (BID) | ORAL | Status: DC | PRN

## 2015-07-26 NOTE — Patient Instructions (Addendum)
Take Xanax twice a day. Biaxin 500 mg twice a day x 10 days Sterapred DS 10 mg 6 day dose pack as directed.

## 2015-07-26 NOTE — Progress Notes (Signed)
   Subjective:    Patient ID: Melinda Payne, female    DOB: 06-30-1940, 75 y.o.   MRN: [id number]  HPI 75 year old Female in today with a new respiratory infection onset Friday, May 19. Has discolored sputum production, cough and congestion. Tells me today that she's not been sleeping well for some time. She took some cough syrup last night and was able to sleep. Had a bout with respiratory infection that was slow to resolve in March and also January. She gets anxious when she's not getting better from these. Long-standing history of anxiety. Have suggested she try Xanax at night for sleep. Were going to change her Xanax from twice a day when necessary to twice a day. She usually takes one Xanax every morning. No fever or shaking chills.    Review of Systems     Objective:   Physical Exam Skin warm and dry. Nodes none. TMs and pharynx are clear. Neck is supple. No adenopathy. Chest clear to auscultation without rales or wheezing.       Assessment & Plan:  Acute URI-see below  Anxiety-see below  Insomnia-see below  Plan: Patient will take Xanax every morning and daily at bedtime. She says she has cough syrup on hand. Biaxin 500 mg twice daily for 10 days. Do not take simvastatin while taking Biaxin. Sterapred DS 10 mg 6 day dosepak take as directed.

## 2015-08-03 ENCOUNTER — Telehealth: Payer: Self-pay

## 2015-08-03 NOTE — Telephone Encounter (Signed)
Patient contacted office today and states that she is not feeling any better. She states that she has no energy and her head feels very congested. She states that she finished her prednisone on Sunday and she is still taking the biaxin until Thursday. She wants to know if there is anything she can take to help her that is available over the counter like a decongestant. She states that ears were popping but are not now. [phone]

## 2015-08-03 NOTE — Telephone Encounter (Signed)
Patient notified

## 2015-08-03 NOTE — Telephone Encounter (Signed)
No decongestant suggested because of hypertension. Continue Biaxin.

## 2015-08-31 ENCOUNTER — Other Ambulatory Visit: Payer: Self-pay | Admitting: Internal Medicine

## 2015-08-31 NOTE — Telephone Encounter (Signed)
Refill x 6 months 

## 2015-09-01 NOTE — Telephone Encounter (Signed)
Phoned to pharmacy 

## 2015-09-03 ENCOUNTER — Other Ambulatory Visit: Payer: Self-pay

## 2015-09-16 DIAGNOSIS — Z86010 Personal history of colonic polyps: Secondary | ICD-10-CM | POA: Diagnosis not present

## 2015-09-27 ENCOUNTER — Telehealth: Payer: Self-pay | Admitting: Internal Medicine

## 2015-09-27 NOTE — Telephone Encounter (Signed)
Spoke to patient. Gave instructions per Dr. Renold Genta. Patient verbalized understanding. Will call tomorrow.

## 2015-09-27 NOTE — Telephone Encounter (Signed)
Ask patient to try one mg of Xanax tonight which would be 4 x 0.25 mg tabs. If sleeps we can call her in some tomorrow.

## 2015-09-27 NOTE — Telephone Encounter (Signed)
Patient states that she has tried the Xanax for sleep (0.25 mg) and that is not doing her any good at all.  States she was desperate last night, so she took Ibuprofen/Benadryl 500mg /25mg ; states that she took that and she slept for probably 4-5 hours.  She feels that she didn't walk the floors.  She doesn't know if that was safe to take or not.     Leaves for mission trip on Saturday 3:00 a.m.  She's wanting to get something nailed down to help her with her sleep prior to leaving for this trip. Please advise.     [facility]:  [facility]

## 2015-09-28 ENCOUNTER — Telehealth: Payer: Self-pay | Admitting: Internal Medicine

## 2015-09-28 NOTE — Telephone Encounter (Signed)
Patient states that the 1mg  of Xanax did work for her last night to help her sleep.  She would like to have that called in for her to her pharmacy.  States thank you so much.  [facility]: [facility]

## 2015-09-28 NOTE — Telephone Encounter (Signed)
Call in Xanax 1 mg #30 with no refill one po qhs  For insomnia

## 2015-09-30 MED ORDER — ALPRAZOLAM 1 MG PO TABS: 1.0000 mg | ORAL_TABLET | Freq: Every day | ORAL | 0 refills | Status: DC

## 2015-09-30 NOTE — Addendum Note (Signed)
Addended by: Milta Deiters on: 09/30/2015 09:45 AM   Modules accepted: Orders

## 2015-09-30 NOTE — Telephone Encounter (Signed)
Called patient to inform med called in to pharmacy requested. No answer. Left vmail.

## 2015-10-25 ENCOUNTER — Other Ambulatory Visit: Payer: Self-pay | Admitting: Internal Medicine

## 2015-11-01 ENCOUNTER — Other Ambulatory Visit: Payer: Self-pay

## 2015-11-01 DIAGNOSIS — H16143 Punctate keratitis, bilateral: Secondary | ICD-10-CM | POA: Diagnosis not present

## 2015-11-01 DIAGNOSIS — Z961 Presence of intraocular lens: Secondary | ICD-10-CM | POA: Diagnosis not present

## 2015-11-01 DIAGNOSIS — H04123 Dry eye syndrome of bilateral lacrimal glands: Secondary | ICD-10-CM | POA: Diagnosis not present

## 2015-11-01 DIAGNOSIS — D3132 Benign neoplasm of left choroid: Secondary | ICD-10-CM | POA: Diagnosis not present

## 2015-11-01 MED ORDER — SIMVASTATIN 10 MG PO TABS: 10.0000 mg | ORAL_TABLET | Freq: Every day | ORAL | 3 refills | Status: DC

## 2015-11-02 ENCOUNTER — Telehealth: Payer: Self-pay | Admitting: Internal Medicine

## 2015-11-02 NOTE — Telephone Encounter (Signed)
Spoke with patient. She will go get another eye exam elsewhere before we consider a sleep study. She is not aware of snoreing and TSH in April was normal.

## 2015-11-02 NOTE — Telephone Encounter (Signed)
Patient calling; states she saw Dr. Katy Fitch yesterday.  He states that her vision in one eye is 20/50 and in the other eye is 20/60.  He can't seem to explain to her why her vision is worse.  He asked her if she has sleep apnea?  She told him she does not.  Dr. Katy Fitch wants her to have a sleep study done because she has dry eyes.  She states that she has been wearing the same glasses (strength) for several years).    Would you like for her to be referred for a sleep study?  Or, would you like for her to be scheduled to see you first?  This all stemming from her having "dry eyes" is what she is saying in her message.    Please advise.

## 2015-11-04 NOTE — Telephone Encounter (Signed)
This encounter was created in error - please disregard.

## 2015-11-09 ENCOUNTER — Other Ambulatory Visit: Payer: Self-pay

## 2015-11-09 MED ORDER — ATENOLOL 25 MG PO TABS: ORAL_TABLET | ORAL | 5 refills | Status: DC

## 2015-11-09 MED ORDER — ALPRAZOLAM 1 MG PO TABS: 1.0000 mg | ORAL_TABLET | Freq: Every evening | ORAL | 5 refills | Status: DC | PRN

## 2015-11-26 ENCOUNTER — Other Ambulatory Visit: Payer: Self-pay | Admitting: *Deleted

## 2015-11-26 MED ORDER — HYDROCHLOROTHIAZIDE 25 MG PO TABS: ORAL_TABLET | ORAL | 3 refills | Status: DC

## 2015-12-23 ENCOUNTER — Other Ambulatory Visit: Payer: Medicare Other | Admitting: Internal Medicine

## 2015-12-23 DIAGNOSIS — I10 Essential (primary) hypertension: Secondary | ICD-10-CM | POA: Diagnosis not present

## 2015-12-23 DIAGNOSIS — E039 Hypothyroidism, unspecified: Secondary | ICD-10-CM | POA: Diagnosis not present

## 2015-12-23 DIAGNOSIS — E559 Vitamin D deficiency, unspecified: Secondary | ICD-10-CM | POA: Diagnosis not present

## 2015-12-23 LAB — COMPREHENSIVE METABOLIC PANEL
ALT: 22 U/L (ref 6–29)
AST: 26 U/L (ref 10–35)
Albumin: 4.2 g/dL (ref 3.6–5.1)
Alkaline Phosphatase: 68 U/L (ref 33–130)
BUN: 20 mg/dL (ref 7–25)
CHLORIDE: 99 mmol/L (ref 98–110)
CO2: 30 mmol/L (ref 20–31)
Calcium: 9.9 mg/dL (ref 8.6–10.4)
Creat: 1.25 mg/dL — ABNORMAL HIGH (ref 0.60–0.93)
Glucose, Bld: 88 mg/dL (ref 65–99)
POTASSIUM: 3.7 mmol/L (ref 3.5–5.3)
SODIUM: 139 mmol/L (ref 135–146)
TOTAL PROTEIN: 6.8 g/dL (ref 6.1–8.1)
Total Bilirubin: 0.7 mg/dL (ref 0.2–1.2)

## 2015-12-23 LAB — LIPID PANEL
CHOL/HDL RATIO: 1.9 ratio (ref <=5.0)
CHOLESTEROL: 188 mg/dL (ref 125–200)
HDL: 98 mg/dL (ref >=46)
LDL CALC: 79 mg/dL (ref <130)
Triglycerides: 54 mg/dL (ref <150)
VLDL: 11 mg/dL (ref <30)

## 2015-12-23 LAB — CBC WITH DIFFERENTIAL/PLATELET
BASOS ABS: 0 {cells}/uL (ref 0–200)
Basophils Relative: 0 %
EOS ABS: 54 {cells}/uL (ref 15–500)
EOS PCT: 1 %
HCT: 33.7 % — ABNORMAL LOW (ref 35.0–45.0)
Hemoglobin: 11.3 g/dL — ABNORMAL LOW (ref 11.7–15.5)
LYMPHS PCT: 40 %
Lymphs Abs: 2160 cells/uL (ref 850–3900)
MCH: 29.4 pg (ref 27.0–33.0)
MCHC: 33.5 g/dL (ref 32.0–36.0)
MCV: 87.5 fL (ref 80.0–100.0)
MONOS PCT: 11 %
MPV: 9 fL (ref 7.5–12.5)
Monocytes Absolute: 594 cells/uL (ref 200–950)
NEUTROS PCT: 48 %
Neutro Abs: 2592 cells/uL (ref 1500–7800)
PLATELETS: 190 10*3/uL (ref 140–400)
RBC: 3.85 MIL/uL (ref 3.80–5.10)
RDW: 13.6 % (ref 11.0–15.0)
WBC: 5.4 10*3/uL (ref 3.8–10.8)

## 2015-12-23 LAB — TSH: TSH: 3.83 mIU/L

## 2015-12-24 LAB — VITAMIN D 25 HYDROXY (VIT D DEFICIENCY, FRACTURES): VIT D 25 HYDROXY: 39 ng/mL (ref 30–100)

## 2015-12-28 ENCOUNTER — Encounter: Payer: Self-pay | Admitting: Internal Medicine

## 2015-12-31 DIAGNOSIS — H524 Presbyopia: Secondary | ICD-10-CM | POA: Diagnosis not present

## 2015-12-31 DIAGNOSIS — D3132 Benign neoplasm of left choroid: Secondary | ICD-10-CM | POA: Diagnosis not present

## 2015-12-31 DIAGNOSIS — Z961 Presence of intraocular lens: Secondary | ICD-10-CM | POA: Diagnosis not present

## 2016-01-04 DIAGNOSIS — L309 Dermatitis, unspecified: Secondary | ICD-10-CM | POA: Diagnosis not present

## 2016-01-04 DIAGNOSIS — L570 Actinic keratosis: Secondary | ICD-10-CM | POA: Diagnosis not present

## 2016-01-12 ENCOUNTER — Ambulatory Visit (INDEPENDENT_AMBULATORY_CARE_PROVIDER_SITE_OTHER): Payer: Medicare Other | Admitting: Internal Medicine

## 2016-01-12 VITALS — BP 130/82 | HR 84 | Wt 145.0 lb

## 2016-01-12 DIAGNOSIS — R0989 Other specified symptoms and signs involving the circulatory and respiratory systems: Secondary | ICD-10-CM | POA: Diagnosis not present

## 2016-01-12 DIAGNOSIS — M48061 Spinal stenosis, lumbar region without neurogenic claudication: Secondary | ICD-10-CM | POA: Diagnosis not present

## 2016-01-12 DIAGNOSIS — N183 Chronic kidney disease, stage 3 unspecified: Secondary | ICD-10-CM

## 2016-01-12 DIAGNOSIS — F411 Generalized anxiety disorder: Secondary | ICD-10-CM

## 2016-01-12 DIAGNOSIS — C541 Malignant neoplasm of endometrium: Secondary | ICD-10-CM

## 2016-01-12 DIAGNOSIS — G4709 Other insomnia: Secondary | ICD-10-CM

## 2016-01-12 DIAGNOSIS — E039 Hypothyroidism, unspecified: Secondary | ICD-10-CM | POA: Diagnosis not present

## 2016-01-12 DIAGNOSIS — Z0000 Encounter for general adult medical examination without abnormal findings: Secondary | ICD-10-CM | POA: Diagnosis not present

## 2016-01-12 DIAGNOSIS — N390 Urinary tract infection, site not specified: Secondary | ICD-10-CM

## 2016-01-12 DIAGNOSIS — M8580 Other specified disorders of bone density and structure, unspecified site: Secondary | ICD-10-CM | POA: Diagnosis not present

## 2016-01-12 DIAGNOSIS — I10 Essential (primary) hypertension: Secondary | ICD-10-CM | POA: Diagnosis not present

## 2016-01-12 DIAGNOSIS — E7800 Pure hypercholesterolemia, unspecified: Secondary | ICD-10-CM

## 2016-01-14 LAB — POC URINALSYSI DIPSTICK (AUTOMATED)
Bilirubin, UA: NEGATIVE
GLUCOSE UA: NEGATIVE
KETONES UA: NEGATIVE
Leukocytes, UA: NEGATIVE
Nitrite, UA: NEGATIVE
Protein, UA: NEGATIVE
RBC UA: NEGATIVE
SPEC GRAV UA: 1.01
UROBILINOGEN UA: NEGATIVE
pH, UA: 5

## 2016-01-31 DIAGNOSIS — D631 Anemia in chronic kidney disease: Secondary | ICD-10-CM | POA: Diagnosis not present

## 2016-01-31 DIAGNOSIS — N2581 Secondary hyperparathyroidism of renal origin: Secondary | ICD-10-CM | POA: Diagnosis not present

## 2016-01-31 DIAGNOSIS — N183 Chronic kidney disease, stage 3 (moderate): Secondary | ICD-10-CM | POA: Diagnosis not present

## 2016-01-31 DIAGNOSIS — I129 Hypertensive chronic kidney disease with stage 1 through stage 4 chronic kidney disease, or unspecified chronic kidney disease: Secondary | ICD-10-CM | POA: Diagnosis not present

## 2016-02-02 ENCOUNTER — Encounter: Payer: Self-pay | Admitting: Internal Medicine

## 2016-02-02 DIAGNOSIS — N189 Chronic kidney disease, unspecified: Secondary | ICD-10-CM | POA: Insufficient documentation
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
## 2016-02-02 NOTE — Patient Instructions (Signed)
Continue follow-up with nephrologist. Continue same medications. Return in 6 months or as needed. No change in medications.

## 2016-02-15 DIAGNOSIS — Z1231 Encounter for screening mammogram for malignant neoplasm of breast: Secondary | ICD-10-CM | POA: Diagnosis not present

## 2016-03-01 ENCOUNTER — Telehealth: Payer: Self-pay | Admitting: Internal Medicine

## 2016-03-01 ENCOUNTER — Encounter: Payer: Self-pay | Admitting: Internal Medicine

## 2016-03-01 NOTE — Telephone Encounter (Signed)
Patient going on [employer] supplement to [facility] after January 1. Wants to be on generic thyroid replacement instead of brand name because it is much cheaper. Changed to levothyroxine 0.075 mg daily #90 with one refill written prescription supplied to her by mail

## 2016-03-06 ENCOUNTER — Other Ambulatory Visit: Payer: Self-pay | Admitting: Internal Medicine

## 2016-03-06 DIAGNOSIS — F419 Anxiety disorder, unspecified: Secondary | ICD-10-CM

## 2016-03-07 NOTE — Telephone Encounter (Signed)
Patient takes 0.25mg  xanax 1-2 times daily and 1mg  at bedtime.  0.25 rx refilled.

## 2016-03-07 NOTE — Telephone Encounter (Signed)
Please call pt. Is she taking this dose too  or just 1 mg Xanax at bedtime?

## 2016-04-19 DIAGNOSIS — Z0100 Encounter for examination of eyes and vision without abnormal findings: Secondary | ICD-10-CM | POA: Diagnosis not present

## 2016-05-01 DIAGNOSIS — H00011 Hordeolum externum right upper eyelid: Secondary | ICD-10-CM | POA: Diagnosis not present
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
## 2016-05-15 ENCOUNTER — Other Ambulatory Visit: Payer: Self-pay | Admitting: Internal Medicine

## 2016-05-15 NOTE — Telephone Encounter (Signed)
Refill x 6 months 

## 2016-06-01 ENCOUNTER — Other Ambulatory Visit: Payer: Self-pay | Admitting: Internal Medicine

## 2016-06-12 DIAGNOSIS — H01004 Unspecified blepharitis left upper eyelid: Secondary | ICD-10-CM | POA: Diagnosis not present

## 2016-06-12 DIAGNOSIS — H01002 Unspecified blepharitis right lower eyelid: Secondary | ICD-10-CM | POA: Diagnosis not present

## 2016-06-12 DIAGNOSIS — H01001 Unspecified blepharitis right upper eyelid: Secondary | ICD-10-CM | POA: Diagnosis not present

## 2016-06-12 DIAGNOSIS — H04551 Acquired stenosis of right nasolacrimal duct: Secondary | ICD-10-CM | POA: Diagnosis not present

## 2016-06-29 ENCOUNTER — Encounter: Payer: Self-pay | Admitting: Internal Medicine

## 2016-06-29 ENCOUNTER — Ambulatory Visit (INDEPENDENT_AMBULATORY_CARE_PROVIDER_SITE_OTHER): Payer: Medicare HMO | Admitting: Internal Medicine

## 2016-06-29 VITALS — BP 130/70 | HR 54 | Temp 97.8°F | Wt 149.0 lb

## 2016-06-29 DIAGNOSIS — G44201 Tension-type headache, unspecified, intractable: Secondary | ICD-10-CM | POA: Diagnosis not present

## 2016-06-29 DIAGNOSIS — J069 Acute upper respiratory infection, unspecified: Secondary | ICD-10-CM | POA: Diagnosis not present

## 2016-06-29 MED ORDER — CLARITHROMYCIN 500 MG PO TABS: 500.0000 mg | ORAL_TABLET | Freq: Two times a day (BID) | ORAL | 0 refills | Status: DC

## 2016-06-29 MED ORDER — HYDROCODONE-ACETAMINOPHEN 5-325 MG PO TABS: 1.0000 | ORAL_TABLET | ORAL | 0 refills | Status: DC | PRN

## 2016-06-29 NOTE — Progress Notes (Signed)
   Subjective:    Patient ID: Melinda Payne, female    DOB: [birth year], 76 y.o.   MRN: [id number]  HPI    Review of Systems     Objective:   Physical Exam        Assessment & Plan:

## 2016-06-29 NOTE — Progress Notes (Signed)
   Subjective:    Patient ID: Melinda Payne, female    DOB: 1940/08/22, 76 y.o.   MRN: [id number]  HPI Onset late afternoon on April 24 of left sided headache starting in the left occipital area and moving toward her left ear area. She's had a bit of nasal congestion. Says usually headache like this is associated with a sinus infection. She's had no fever or shaking chills. No nausea and vomiting. She tried ibuprofen which did give her some relief but the headache would return. I do see that I've ever treated her for protracted headache previously. Is generally been for a sinus infection without prescribed headache in previous notes. 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 It's been some time since she's had such a headache she says. No visual disturbances. No numbness or tingling or weakness in the extremities. No visual disturbances.    Review of Systems see above     Objective:   Physical Exam TMs are clear bilaterally. Boggy nasal mucosa. Pharynx is clear. Neck is supple. Chest clear to auscultation. Funduscopic exam show suggest be sharp and flat bilaterally. Muscle strength is normal. Moves all 4 extremities. PERRLA. Extraocular movements are full.       Assessment & Plan:  Protracted headache  URI  Plan: Depo-Medrol 80 mg IM. Norco 5/325 one by mouth every 6-8 hours when necessary pain. Take with food. Do not take Biaxin with simvastatin. Given Biaxin 500 mg twice daily for 10 days. Call if not better in 24 hours or sooner if worse.

## 2016-06-29 NOTE — Patient Instructions (Signed)
Norco 5/325 one by mouth every 6 hours when necessary pain. Biaxin 500 mg twice daily for 10 days. Depo-Medrol 80 mg IM. Rest and drink plenty of fluids. Call if not better in 24 hours or sooner if worse.

## 2016-07-13 ENCOUNTER — Encounter: Payer: Self-pay | Admitting: Internal Medicine

## 2016-07-13 ENCOUNTER — Ambulatory Visit (INDEPENDENT_AMBULATORY_CARE_PROVIDER_SITE_OTHER): Payer: Medicare HMO | Admitting: Internal Medicine

## 2016-07-13 VITALS — BP 140/70 | HR 55 | Temp 98.5°F | Wt 149.0 lb

## 2016-07-13 DIAGNOSIS — R0989 Other specified symptoms and signs involving the circulatory and respiratory systems: Secondary | ICD-10-CM | POA: Diagnosis not present

## 2016-07-13 DIAGNOSIS — F411 Generalized anxiety disorder: Secondary | ICD-10-CM | POA: Diagnosis not present

## 2016-07-13 DIAGNOSIS — M48061 Spinal stenosis, lumbar region without neurogenic claudication: Secondary | ICD-10-CM | POA: Diagnosis not present

## 2016-07-13 DIAGNOSIS — M8580 Other specified disorders of bone density and structure, unspecified site: Secondary | ICD-10-CM

## 2016-07-13 DIAGNOSIS — I10 Essential (primary) hypertension: Secondary | ICD-10-CM | POA: Diagnosis not present

## 2016-07-13 DIAGNOSIS — E039 Hypothyroidism, unspecified: Secondary | ICD-10-CM | POA: Diagnosis not present

## 2016-07-13 DIAGNOSIS — E7800 Pure hypercholesterolemia, unspecified: Secondary | ICD-10-CM | POA: Diagnosis not present

## 2016-07-13 DIAGNOSIS — R69 Illness, unspecified: Secondary | ICD-10-CM | POA: Diagnosis not present

## 2016-07-13 LAB — TSH: TSH: 6.91 mIU/L — ABNORMAL HIGH

## 2016-07-13 NOTE — Progress Notes (Signed)
   Subjective:    Patient ID: Melinda Payne, female    DOB: 05-22-1940, 76 y.o.   MRN: [id number]  HPI  76 year old White Female with hx of anxiety, Chronic kidney disease, HTN, Hypothyroidism. Insomnia, osteopenia, hyperlipuidemia, lumbar spinal stenosisFor six-month recheck. She has a strong family history of dementia. She finds herself being more forgetful and I would agree with that. Her TSH is abnormal at 6.91 but we contacted her about this and apparently she discontinued her thyroid replacement medication thinking it interacted with her Biaxin when really it was her lipid-lowering medication that had an interaction with Biaxin. She says she misunderstood this. Therefore she is going to have to have repeat TSH in 4 weeks. It might be wise to check B-12 and folate levels at that time with her memory loss.  She is followed at [facility] for stage III chronic kidney disease.  With regard to insomnia, she is now sleeping well. I don't think her insomnia medication is contributing to this memory loss. It seems to been gradual over the past couple of years.    Review of Systems see above     Objective:   Physical Exam Skin warm and dry. Nodes none. No thyromegaly. Chest clear. Cardiac exam regular rate and rhythm normal S1 and S2. Right carotid bruit. Chest clear to auscultation. Extremities without edema.       Assessment & Plan:  Memory loss-needs to restart thyroid replacement return in 4 weeks. Consider checking B-12 and folate levels at that time  Insomnia-stable  Hypothyroidism- TSH level is elevated but patient has not been taking medication recently because she thought it had a drug interaction with Biaxin.  Hyperlipidemia-not checked at this visit  Hypertension-stable  Chronic kidney disease-stable and followed by nephrologist.  Addendum: As of May 28 she has not call for return appointment on thyroid functions. We will contact her and she will be due around June  5 for follow-up on elevated TSH. We also want to check B-12 and folate levels due to memory loss.

## 2016-07-31 NOTE — Patient Instructions (Signed)
Return in 4 weeks to follow-up on hypothyroidism with TSH. Also will check B-12 and folate levels at that time.

## 2016-08-14 ENCOUNTER — Other Ambulatory Visit: Payer: Medicare HMO | Admitting: Internal Medicine

## 2016-08-14 DIAGNOSIS — R413 Other amnesia: Secondary | ICD-10-CM | POA: Diagnosis not present

## 2016-08-14 DIAGNOSIS — E039 Hypothyroidism, unspecified: Secondary | ICD-10-CM | POA: Diagnosis not present

## 2016-08-14 LAB — TSH: TSH: 2.4 m[IU]/L

## 2016-08-15 ENCOUNTER — Encounter: Payer: Self-pay | Admitting: Internal Medicine

## 2016-08-15 ENCOUNTER — Ambulatory Visit (INDEPENDENT_AMBULATORY_CARE_PROVIDER_SITE_OTHER): Payer: Medicare HMO | Admitting: Internal Medicine

## 2016-08-15 VITALS — BP 128/62 | HR 59 | Temp 97.7°F | Wt 145.0 lb

## 2016-08-15 DIAGNOSIS — N183 Chronic kidney disease, stage 3 unspecified: Secondary | ICD-10-CM

## 2016-08-15 DIAGNOSIS — G4709 Other insomnia: Secondary | ICD-10-CM

## 2016-08-15 DIAGNOSIS — F411 Generalized anxiety disorder: Secondary | ICD-10-CM

## 2016-08-15 DIAGNOSIS — E039 Hypothyroidism, unspecified: Secondary | ICD-10-CM | POA: Diagnosis not present

## 2016-08-15 DIAGNOSIS — I10 Essential (primary) hypertension: Secondary | ICD-10-CM

## 2016-08-15 DIAGNOSIS — C541 Malignant neoplasm of endometrium: Secondary | ICD-10-CM | POA: Diagnosis not present

## 2016-08-15 DIAGNOSIS — R69 Illness, unspecified: Secondary | ICD-10-CM | POA: Diagnosis not present

## 2016-08-15 LAB — B12 AND FOLATE PANEL: Folate: 17.6 ng/mL (ref >5.4)

## 2016-08-15 MED ORDER — DONEPEZIL HCL 5 MG PO TABS: 5.0000 mg | ORAL_TABLET | Freq: Every day | ORAL | 2 refills | Status: DC

## 2016-08-15 NOTE — Patient Instructions (Addendum)
Thyroid functions back to normal after restarting medication.  B12 and folate levels normal. Trial of Aricept 5 mg daily. Return in November.

## 2016-08-15 NOTE — Progress Notes (Signed)
   Subjective:    Patient ID: Melinda Payne, female    DOB: 20-Jun-1940, 76 y.o.   MRN: [id number]  HPI 76 year old Female for follow up of elevated TSH and memory loss. Strong family hx Altzheimers. I have noticed some forgetfulness in pt. lately.  B12 and folate  Levels done June 11 are normal. TSH is back to normal. Pt discontinued Thyroid replacement medication while taking Biaxin recently thinking there was an interaction.She was supposed to stop taking her lipid-lowering medication while on Biaxin. Her TSH was elevated as a result of her discontinuing the thyroid medication. That is why  I had her come back for follow-up. In May, her TSH was 6.91 off the medication and now it is 2.40. Blood pressure and chronic kidney disease stable. Anxiety stable.   Review of Systems see above-no new complaints. Going on mission trip in a few months to [place]     Objective:   Physical Exam She is alert and oriented 3 and is speaking appropriately today. She has some situational stress with family members that is causing her some duress.     Assessment & Plan:  Hypothyroidism Memory loss- mild-Willing to try Aricept Trial of Aricept 5 mg daily. Continue same dose of thyroid replacement  RTC November- has appt.

## 2016-08-16 DIAGNOSIS — C541 Malignant neoplasm of endometrium: Secondary | ICD-10-CM | POA: Insufficient documentation

## 2016-09-04 ENCOUNTER — Other Ambulatory Visit: Payer: Self-pay | Admitting: Internal Medicine

## 2016-09-08 ENCOUNTER — Ambulatory Visit: Payer: Self-pay | Admitting: Physician Assistant

## 2016-09-08 ENCOUNTER — Ambulatory Visit (INDEPENDENT_AMBULATORY_CARE_PROVIDER_SITE_OTHER): Payer: 59 | Admitting: Family Medicine

## 2016-09-08 VITALS — BP 118/72 | HR 57 | Temp 98.3°F | Wt 146.2 lb

## 2016-09-08 DIAGNOSIS — B349 Viral infection, unspecified: Secondary | ICD-10-CM

## 2016-09-08 DIAGNOSIS — L509 Urticaria, unspecified: Secondary | ICD-10-CM | POA: Diagnosis not present

## 2016-09-08 NOTE — Patient Instructions (Signed)
Please get some over the counter Zyrtec (generic is fine- cetirizine) and take daily for at least 5 days, can take 1 benadryl at bedtime if needed for itching Also take over the counter Mucinex (plain, generic is fine) If any trouble swallowing, breathing or swelling of face call 911    Hives Hives (urticaria) are itchy, red, swollen areas on your skin. Hives can appear on any part of your body and can vary in size. They can be as small as the tip of a pen or much larger. Hives often fade within 24 hours (acute hives). In other cases, new hives appear after old ones fade. This cycle can continue for several days or weeks (chronic hives). Hives result from your body's reaction to an irritant or to something that you are allergic to (trigger). When you are exposed to a trigger, your body releases a chemical (histamine) that causes redness, itching, and swelling. You can get hives immediately after being exposed to a trigger or hours later. Hives do not spread from person to person (are not contagious). Your hives may get worse with scratching, exercise, and emotional stress. What are the causes? Causes of this condition include:  Allergies to certain foods or ingredients.  Insect bites or stings.  Exposure to pollen or pet dander.  Contact with latex or chemicals.  Spending time in sunlight, heat, or cold (exposure).  Exercise.  Stress.  You can also get hives from some medical conditions and treatments. These include:  Viruses, including the common cold.  Bacterial infections, such as urinary tract infections and strep throat.  Disorders such as vasculitis, lupus, or thyroid disease.  Certain medications.  Allergy shots.  Blood transfusions.  Sometimes, the cause of hives is not known (idiopathic hives). What increases the risk? This condition is more likely to develop in:  Women.  People who have food allergies, especially to citrus fruits, milk, eggs, peanuts, tree  nuts, or shellfish.  People who are allergic to: ? Medicines. ? Latex. ? Insects. ? Animals. ? Pollen.  People who have certain medical conditions, includinglupus or thyroid disease.  What are the signs or symptoms? The main symptom of this condition is raised, itchyred or white bumps or patches on your skin. These areas may:  Become large and swollen (welts).  Change in shape and location, quickly and repeatedly.  Be separate hives or connect over a large area of skin.  Sting or become painful.  Turn white when pressed in the center (blanch).  In severe cases, yourhands, feet, and face may also become swollen. This may occur if hives develop deeper in your skin. How is this diagnosed? This condition is diagnosed based on your symptoms, medical history, and physical exam. Your skin, urine, or blood may be tested to find out what is causing your hives and to rule out other health issues. Your health care provider may also remove a small sample of skin from the affected area and examine it under a microscope (biopsy). How is this treated? Treatment depends on the severity of your condition. Your health care provider may recommend using cool, wet cloths (cool compresses) or taking cool showers to relieve itching. Hives are sometimes treated with medicines, including:  Antihistamines.  Corticosteroids.  Antibiotics.  An injectable medicine (omalizumab). Your health care provider may prescribe this if you have chronic idiopathic hives and you continue to have symptoms even after treatment with antihistamines.  Severe cases may require an emergency injection of adrenaline (epinephrine) to prevent a  life-threatening allergic reaction (anaphylaxis). Follow these instructions at home: Medicines  Take or apply over-the-counter and prescription medicines only as told by your health care provider.  If you were prescribed an antibiotic medicine, use it as told by your health care  provider. Do not stop taking the antibiotic even if you start to feel better. Skin Care  Apply cool compresses to the affected areas.  Do not scratch or rub your skin. General instructions  Do not take hot showers or baths. This can make itching worse.  Do not wear tight-fitting clothing.  Use sunscreen and wear protective clothing when you are outside.  Avoid any substances that cause your hives. Keep a journal to help you track what causes your hives. Write down: ? What medicines you take. ? What you eat and drink. ? What products you use on your skin.  Keep all follow-up visits as told by your health care provider. This is important. Contact a health care provider if:  Your symptoms are not controlled with medicine.  Your joints are painful or swollen. Get help right away if:  You have a fever.  You have pain in your abdomen.  Your tongue or lips are swollen.  Your eyelids are swollen.  Your chest or throat feels tight.  You have trouble breathing or swallowing. These symptoms may represent a serious problem that is an emergency. Do not wait to see if the symptoms will go away. Get medical help right away. Call your local emergency services (911 in the [place] Do not drive yourself to the hospital. This information is not intended to replace advice given to you by your health care provider. Make sure you discuss any questions you have with your health care provider. Document Released: 02/20/2005 Document Revised: 07/21/2015 Document Reviewed: 12/09/2014 Elsevier Interactive Patient Education  2017 [employer].

## 2016-09-08 NOTE — Progress Notes (Signed)
   Subjective:    Patient ID: Melinda Payne, female    DOB: March 18, 1940, 76 y.o.   MRN: [id number]  HPI This is a 76 yo female who presents today with rash of her body x 1 day and raspy voice x 1 days, feels like something is caught. Some runny nose, no ear pain, sore throat yesterday, pain with swallowing, better today. Had to clear throat constantly. No cough. Eating and drinking ok. No ear pain or fever.  Noticed about 3 bumps on her arm, thought it was mosquito bites, more today on trunk. Very itchy. Some improvement after shower today.   Past Medical History:  Diagnosis Date  . Cancer ([place]) 1997   melanoma rt neck  . Counseling for estrogen replacement therapy   . Fibrocystic breast disease   . Hypertension   . Insomnia   . Spinal stenosis   . Vitamin D deficiency    Past Surgical History:  Procedure Laterality Date  . ABDOMINAL HYSTERECTOMY  03/1997   bso endometrial cancer  . TUBAL LIGATION     Family History  Problem Relation Age of Onset  . Alcohol abuse Father   . Alzheimer's disease Sister   . Alzheimer's disease Brother    Social History  Substance Use Topics  . Smoking status: Never Smoker  . Smokeless tobacco: Never Used  . Alcohol use No      Review of Systems Per HPI    Objective:   Physical Exam  Constitutional: She is oriented to person, place, and time. She appears well-developed and well-nourished. No distress.  HENT:  Head: Normocephalic and atraumatic.  Right Ear: External ear normal.  Left Ear: External ear normal.  Nose: Nose normal.  Mouth/Throat: Oropharynx is clear and moist. No oropharyngeal exudate.  Hoarse voice.   Eyes: Conjunctivae are normal.  Neck: Normal range of motion. Neck supple.  Cardiovascular: Normal rate, regular rhythm and normal heart sounds.   Pulmonary/Chest: Effort normal and breath sounds normal.  Lymphadenopathy:    She has no cervical adenopathy.  Neurological: She is alert and oriented to person, place,  and time.  Skin: Skin is warm and dry. Rash (few scattered wheals on arms, abdomen and back.) noted. She is not diaphoretic.  Psychiatric: She has a normal mood and affect. Her behavior is normal. Judgment and thought content normal.  Vitals reviewed.     BP 118/72 (BP Location: Left Arm, Patient Position: Sitting, Cuff Size: Normal)   Pulse (!) 57   Temp 98.3 F (36.8 C) (Oral)   Wt 146 lb 3.2 oz (66.3 kg)   SpO2 98%   BMI 27.62 kg/m  Wt Readings from Last 3 Encounters:  09/08/16 146 lb 3.2 oz (66.3 kg)  08/15/16 145 lb (65.8 kg)  07/13/16 149 lb (67.6 kg)       Assessment & Plan:  1. Hives - Provided written and verbal information regarding diagnosis and treatment. - advised her to take cetirizine daily, can take 12.5-25 mg diphenhydramine at bedtime if needed for itching - suspect related to viral illness - RTC/ED precautions reviewed  2. Viral illness - symptomatic treatment, f/u if any s/s bacterial infection- fever/chills, purulent sputum/nasal drainage   Clarene Reamer, FNP-BC  [facility] [place], [place]  09/08/2016 4:52 PM

## 2016-09-11 DIAGNOSIS — L821 Other seborrheic keratosis: Secondary | ICD-10-CM | POA: Diagnosis not present

## 2016-09-11 DIAGNOSIS — L820 Inflamed seborrheic keratosis: Secondary | ICD-10-CM | POA: Diagnosis not present

## 2016-09-11 DIAGNOSIS — L570 Actinic keratosis: Secondary | ICD-10-CM | POA: Diagnosis not present

## 2016-09-11 DIAGNOSIS — D1801 Hemangioma of skin and subcutaneous tissue: Secondary | ICD-10-CM | POA: Diagnosis not present

## 2016-09-11 DIAGNOSIS — Z85820 Personal history of malignant melanoma of skin: Secondary | ICD-10-CM | POA: Diagnosis not present

## 2016-09-11 DIAGNOSIS — D2361 Other benign neoplasm of skin of right upper limb, including shoulder: Secondary | ICD-10-CM | POA: Diagnosis not present

## 2016-09-21 ENCOUNTER — Telehealth: Payer: Self-pay

## 2016-09-21 DIAGNOSIS — A0100 Typhoid fever, unspecified: Secondary | ICD-10-CM

## 2016-09-21 NOTE — Telephone Encounter (Signed)
Pt is aware, went over dosing, will pick up next week

## 2016-09-21 NOTE — Telephone Encounter (Signed)
Pt is going to [place] in October and said the health department doesn't accept Her insurance and it will be 70-150 dollars to go there. She wanted to know if Dr. Renold Genta would fill her oral Typhoid medication for her trip. Please advise

## 2016-09-21 NOTE — Telephone Encounter (Signed)
This is true injection lasts 2 years and  the oral lasts 5 years. She will need to come pick up written script here. It can be hard to find. She can call around to various pharmacies like [facility]. I will write RX

## 2016-09-21 NOTE — Telephone Encounter (Signed)
We can write a Rx for oral typhoid immunization but if she had it in the past 5 years it is still good.

## 2016-09-21 NOTE — Telephone Encounter (Signed)
Pt had the injection Typhoid on 11/2011 and was told that only lasted for two years. Please advise

## 2016-09-26 DIAGNOSIS — R69 Illness, unspecified: Secondary | ICD-10-CM | POA: Diagnosis not present

## 2016-10-17 ENCOUNTER — Telehealth: Payer: Self-pay | Admitting: Internal Medicine

## 2016-10-17 ENCOUNTER — Other Ambulatory Visit: Payer: Self-pay | Admitting: Internal Medicine

## 2016-10-17 DIAGNOSIS — Z0000 Encounter for general adult medical examination without abnormal findings: Secondary | ICD-10-CM | POA: Diagnosis not present

## 2016-10-17 DIAGNOSIS — Z7982 Long term (current) use of aspirin: Secondary | ICD-10-CM | POA: Diagnosis not present

## 2016-10-17 DIAGNOSIS — R69 Illness, unspecified: Secondary | ICD-10-CM | POA: Diagnosis not present

## 2016-10-17 DIAGNOSIS — E039 Hypothyroidism, unspecified: Secondary | ICD-10-CM | POA: Diagnosis not present

## 2016-10-17 DIAGNOSIS — E785 Hyperlipidemia, unspecified: Secondary | ICD-10-CM | POA: Diagnosis not present

## 2016-10-17 DIAGNOSIS — G4700 Insomnia, unspecified: Secondary | ICD-10-CM | POA: Diagnosis not present

## 2016-10-17 DIAGNOSIS — Z6826 Body mass index (BMI) 26.0-26.9, adult: Secondary | ICD-10-CM | POA: Diagnosis not present

## 2016-10-17 DIAGNOSIS — N329 Bladder disorder, unspecified: Secondary | ICD-10-CM | POA: Diagnosis not present

## 2016-10-17 DIAGNOSIS — G3184 Mild cognitive impairment, so stated: Secondary | ICD-10-CM | POA: Diagnosis not present

## 2016-10-17 DIAGNOSIS — I10 Essential (primary) hypertension: Secondary | ICD-10-CM | POA: Diagnosis not present

## 2016-10-17 NOTE — Telephone Encounter (Signed)
Schedule OV next week 

## 2016-10-17 NOTE — Telephone Encounter (Signed)
Pt is aware.  

## 2016-10-17 NOTE — Telephone Encounter (Signed)
Refill Xanax x 90 days

## 2016-10-17 NOTE — Telephone Encounter (Signed)
A nurse from her insurance, [employer] came out to do a home visit and took her BP and Pulse doing a home visit/exam.  Her BP was 116/74, Pulse was 50.  She had not taken any of her medications at the time of the visit.  The nurse advised her to hold her medications.  She is concerned that if she takes her medications, it may cause her to bottom out on her BP.    Should she continue to hold her BP meds?  What should she do?    Best # for contact:  [phone]

## 2016-10-17 NOTE — Telephone Encounter (Signed)
Only refill x 90 days

## 2016-10-24 ENCOUNTER — Ambulatory Visit (INDEPENDENT_AMBULATORY_CARE_PROVIDER_SITE_OTHER): Payer: Medicare HMO | Admitting: Internal Medicine

## 2016-10-24 ENCOUNTER — Ambulatory Visit: Payer: Medicare HMO | Admitting: Internal Medicine

## 2016-10-24 ENCOUNTER — Encounter: Payer: Self-pay | Admitting: Internal Medicine

## 2016-10-24 VITALS — BP 160/90 | HR 89 | Ht 62.0 in | Wt 148.0 lb

## 2016-10-24 DIAGNOSIS — I10 Essential (primary) hypertension: Secondary | ICD-10-CM

## 2016-10-24 NOTE — Patient Instructions (Signed)
Restart HCTZ and return in 2 weeks

## 2016-10-24 NOTE — Progress Notes (Signed)
   Subjective:    Patient ID: Melinda Payne, female    DOB: Jul 06, 1940, 76 y.o.   MRN: [id number]  HPI  She had a recent home visit by nurse with her insurance carrier [employer]. Was told that her blood pressure was low. Her blood pressure was 116/74 and pulse was 50 and she had not taken any of her medications at that time. Nurse advised her not to take any of her medications and to contact us.  Patient says nurse told her she did not need any of these medications. Patient says nurse also recommended losartan to the patient.  Patient has been taking blood pressures at home off all medications. She brings in multiple blood pressure readings. Pulse has been between 50 to and 75. Blood pressure has been between 315 and 945 systolically and 85-92 diastolically.    Review of Systems     Objective:   Physical Exam  Reviewed blood pressure readings that she brought in today. Her blood pressure today is 160 /90 with pulse of 89. I rechecked her blood pressure and got similar results.      Assessment & Plan:  Essential hypertension  Plan: She'll restart HCTZ 25 mg daily and return in 2 weeks for office visit and blood pressure check. Will hold off on amlodipine and Tenormin at the present time.

## 2016-10-30 DIAGNOSIS — Z85820 Personal history of malignant melanoma of skin: Secondary | ICD-10-CM | POA: Diagnosis not present

## 2016-10-30 DIAGNOSIS — L570 Actinic keratosis: Secondary | ICD-10-CM | POA: Diagnosis not present

## 2016-10-30 DIAGNOSIS — D1801 Hemangioma of skin and subcutaneous tissue: Secondary | ICD-10-CM | POA: Diagnosis not present

## 2016-10-30 DIAGNOSIS — L821 Other seborrheic keratosis: Secondary | ICD-10-CM | POA: Diagnosis not present

## 2016-11-02 ENCOUNTER — Other Ambulatory Visit: Payer: Self-pay | Admitting: Internal Medicine

## 2016-11-10 ENCOUNTER — Ambulatory Visit (INDEPENDENT_AMBULATORY_CARE_PROVIDER_SITE_OTHER): Payer: Medicare HMO | Admitting: Internal Medicine

## 2016-11-10 ENCOUNTER — Encounter: Payer: Self-pay | Admitting: Internal Medicine

## 2016-11-10 VITALS — BP 130/80 | HR 60 | Temp 97.9°F | Wt 146.0 lb

## 2016-11-10 DIAGNOSIS — Z7184 Encounter for health counseling related to travel: Secondary | ICD-10-CM

## 2016-11-10 DIAGNOSIS — I10 Essential (primary) hypertension: Secondary | ICD-10-CM | POA: Diagnosis not present

## 2016-11-10 DIAGNOSIS — Z7189 Other specified counseling: Secondary | ICD-10-CM | POA: Diagnosis not present

## 2016-11-10 DIAGNOSIS — Z23 Encounter for immunization: Secondary | ICD-10-CM

## 2016-11-10 MED ORDER — CIPROFLOXACIN HCL 500 MG PO TABS: 500.0000 mg | ORAL_TABLET | Freq: Two times a day (BID) | ORAL | 0 refills | Status: DC

## 2016-11-10 NOTE — Patient Instructions (Addendum)
Flu vaccine given. Continue HCTZ and follow-up here in November for physical examination. Take Cipro with you on your mission trip next month.

## 2016-11-10 NOTE — Progress Notes (Signed)
   Subjective:    Patient ID: Melinda Payne, female    DOB: 23-Dec-1940, 76 y.o.   MRN: [id number]  HPI  76 year old Female for follow up on Medication for central hypertension. She was seen here August 21 after she had had a home visit by nurse with her insurance company telling her that her blood pressure was low. Her pulse was 50 at the time of the home visit. She was on a beta blocker. She had also been prescribed HCTZ and amlodipine which were held before she came to the office on August 21.  When she arrived at the office on August 21 her blood pressure was 160/90t and her pulse was 89. We restarted HCTZ and she is here for follow-up.  Next month she goes on a mission trip.  She was given flu vaccine today. Gave her prescription to take with her for Cipro.  Says she is going to the gym several times a week and is riding a bike 6 miles at a time which is excellent. She looks more rested than she did at last visit.  She has appointment for physical exam in November. Review of Systems See above. She brings in multiple blood pressure readings from home that range from 048  To 889 systolically and diastolics ranging between 61 and 78.    Objective:   Physical Exam Skin warm and dry. Nodes none. Chest clear. Cardiac exam regular rate and rhythm normal S1 and S2. Extremities without edema       Assessment & Plan:  Bradycardia has resolved. Her blood pressure readings are higher than they were previously but she feels better. Her blood pressure is stable today in the office at 130/80 with pulse of 60. She is going to continue with HCTZ and we will follow-up with her in November.  For travel, have given prescription for Cipro.

## 2016-11-16 ENCOUNTER — Other Ambulatory Visit: Payer: Self-pay | Admitting: Internal Medicine

## 2016-11-16 NOTE — Telephone Encounter (Signed)
Refill x 6 months 

## 2016-12-11 ENCOUNTER — Other Ambulatory Visit: Payer: Self-pay | Admitting: Internal Medicine

## 2016-12-11 NOTE — Telephone Encounter (Signed)
Refill each x 6 months

## 2017-01-01 ENCOUNTER — Ambulatory Visit
Admission: RE | Admit: 2017-01-01 | Discharge: 2017-01-01 | Disposition: A | Discharge status: Home / Self Care | Payer: Self-pay | Source: Ambulatory Visit | Attending: Internal Medicine | Admitting: Internal Medicine

## 2017-01-01 ENCOUNTER — Encounter: Payer: Self-pay | Admitting: Internal Medicine

## 2017-01-01 ENCOUNTER — Ambulatory Visit (INDEPENDENT_AMBULATORY_CARE_PROVIDER_SITE_OTHER): Payer: Medicare HMO | Admitting: Internal Medicine

## 2017-01-01 VITALS — BP 128/80 | HR 86 | Temp 99.5°F | Wt 147.0 lb

## 2017-01-01 DIAGNOSIS — R05 Cough: Secondary | ICD-10-CM | POA: Diagnosis not present

## 2017-01-01 DIAGNOSIS — H6693 Otitis media, unspecified, bilateral: Secondary | ICD-10-CM | POA: Diagnosis not present

## 2017-01-01 DIAGNOSIS — J0100 Acute maxillary sinusitis, unspecified: Secondary | ICD-10-CM

## 2017-01-01 DIAGNOSIS — R5383 Other fatigue: Secondary | ICD-10-CM

## 2017-01-01 DIAGNOSIS — J22 Unspecified acute lower respiratory infection: Secondary | ICD-10-CM | POA: Diagnosis not present

## 2017-01-01 DIAGNOSIS — R059 Cough, unspecified: Secondary | ICD-10-CM

## 2017-01-01 MED ORDER — CLARITHROMYCIN 500 MG PO TABS: 500.0000 mg | ORAL_TABLET | Freq: Two times a day (BID) | ORAL | 0 refills | Status: DC

## 2017-01-01 MED ORDER — CEFTRIAXONE SODIUM 1 G IJ SOLR
1.0000 g | Freq: Once | INTRAMUSCULAR | Status: AC
  Administered 2017-01-01: 1 g via INTRAMUSCULAR

## 2017-01-01 NOTE — Patient Instructions (Signed)
Take Hycodan as needed for cough.  Rocephin 1 g IM.  Have chest x-ray to see if you have pneumonia.  You have abnormal breath sounds in left lower lobe.  Rest and drink plenty of fluids.  Take Biaxin 500 mg twice daily for 10 days.  Do not take lipid-lowering medication while taking Biaxin.

## 2017-01-01 NOTE — Addendum Note (Signed)
Addended by: Drucilla Schmidt on: 01/01/2017 03:22 PM   Modules accepted: Orders

## 2017-01-01 NOTE — Progress Notes (Signed)
   Subjective:    Patient ID: Melinda Payne, female    DOB: [birth year], 76 y.o.   MRN: [id number]  HPI 76 year old Female went on one week mission trip to [place] and came down with URI some 3days ago. Others on trip had similar illness.  Has malaise and fatigue.  No documented fever or shaking chills.  Trip was difficult.  She was high in the mountains and walking uphill made her short of breath.  Plane trip was also harrowing.  She got left in the airport.    Review of Systems See above    Objective:   Physical Exam  Constitutional:  Nasally congested when she speaks.  HENT:  Head: Normocephalic and atraumatic.  Mouth/Throat: Oropharynx is clear and moist.  Both TMS dull. Left TM centrally injected.  Eyes: Right eye exhibits no discharge. Left eye exhibits no discharge.  Neck: Neck supple.  Cardiovascular: Normal rate, regular rhythm and normal heart sounds.   Pulmonary/Chest: Effort normal and breath sounds normal. No respiratory distress. She has no wheezes.  Rhonchi left chest  Musculoskeletal: She exhibits no edema.  Lymphadenopathy:    She has no cervical adenopathy.  Skin: Skin is warm and dry.  Vitals reviewed.         Assessment & Plan:  Acute bronchitis-she has rhonchi and?  Rales left lower lobe.  Chest x-ray to check for pneumonia.  Acute bilateral otitis media  Acute maxillary sinusitis  Plan: Biaxin 500 mg twice daily for 10 days.  Rocephin 1 g IM.  Has Hycodan on hand for cough.  Rest and drink plenty of fluids.  Do not take lipid lowering medication while on Biaxin.

## 2017-01-04 ENCOUNTER — Telehealth: Payer: Self-pay

## 2017-01-04 NOTE — Telephone Encounter (Signed)
This is going to take some time. She is run down See next week if no better. Rest and drink fluids

## 2017-01-04 NOTE — Telephone Encounter (Signed)
Pt called because is not feeling any better. She did not start her Biaxin until Tuesday. She also was given an injection when here. She said shes "stopped up" and coughing a lot but not productive. She blew her nose last night and blood did come out. Advised pt that she is only starting day 3 of antibiotics today and it may take the full course to become fully effective but that I would ask Dr. Renold Genta if she can take another medication with Biaxin (OTC) or what the patient should do. Please advise.

## 2017-01-04 NOTE — Telephone Encounter (Signed)
Pt is aware.  

## 2017-01-09 ENCOUNTER — Other Ambulatory Visit: Payer: Self-pay | Admitting: Internal Medicine

## 2017-01-12 ENCOUNTER — Encounter: Payer: Self-pay | Admitting: Internal Medicine

## 2017-01-13 ENCOUNTER — Other Ambulatory Visit: Payer: Self-pay | Admitting: Internal Medicine

## 2017-01-16 ENCOUNTER — Ambulatory Visit (INDEPENDENT_AMBULATORY_CARE_PROVIDER_SITE_OTHER): Payer: Medicare HMO | Admitting: Internal Medicine

## 2017-01-16 ENCOUNTER — Encounter: Payer: Self-pay | Admitting: Internal Medicine

## 2017-01-16 VITALS — BP 158/84 | HR 66 | Temp 97.8°F | Wt 142.0 lb

## 2017-01-16 DIAGNOSIS — R5383 Other fatigue: Secondary | ICD-10-CM | POA: Diagnosis not present

## 2017-01-16 DIAGNOSIS — H6503 Acute serous otitis media, bilateral: Secondary | ICD-10-CM | POA: Diagnosis not present

## 2017-01-16 DIAGNOSIS — H9192 Unspecified hearing loss, left ear: Secondary | ICD-10-CM | POA: Diagnosis not present

## 2017-01-16 LAB — CBC WITH DIFFERENTIAL/PLATELET
BASOS ABS: 19 {cells}/uL (ref 0–200)
Basophils Relative: 0.3 %
EOS ABS: 38 {cells}/uL (ref 15–500)
Eosinophils Relative: 0.6 %
HEMATOCRIT: 34.6 % — AB (ref 35.0–45.0)
Hemoglobin: 11.3 g/dL — ABNORMAL LOW (ref 11.7–15.5)
LYMPHS ABS: 2495 {cells}/uL (ref 850–3900)
MCH: 28.7 pg (ref 27.0–33.0)
MCHC: 32.7 g/dL (ref 32.0–36.0)
MCV: 87.8 fL (ref 80.0–100.0)
MPV: 9.9 fL (ref 7.5–12.5)
Monocytes Relative: 11.2 %
NEUTROS PCT: 48.3 %
Neutro Abs: 3043 cells/uL (ref 1500–7800)
Platelets: 270 10*3/uL (ref 140–400)
RBC: 3.94 10*6/uL (ref 3.80–5.10)
RDW: 12.4 % (ref 11.0–15.0)
Total Lymphocyte: 39.6 %
WBC: 6.3 10*3/uL (ref 3.8–10.8)
WBCMIX: 706 {cells}/uL (ref 200–950)

## 2017-01-16 LAB — COMPLETE METABOLIC PANEL WITH GFR
AG RATIO: 1.4 (calc) (ref 1.0–2.5)
ALT: 10 U/L (ref 6–29)
AST: 15 U/L (ref 10–35)
Albumin: 4.2 g/dL (ref 3.6–5.1)
Alkaline phosphatase (APISO): 70 U/L (ref 33–130)
BUN/Creatinine Ratio: 18 (calc) (ref 6–22)
BUN: 19 mg/dL (ref 7–25)
CALCIUM: 9.9 mg/dL (ref 8.6–10.4)
CO2: 33 mmol/L — ABNORMAL HIGH (ref 20–32)
Chloride: 99 mmol/L (ref 98–110)
Creat: 1.06 mg/dL — ABNORMAL HIGH (ref 0.60–0.93)
GFR, EST NON AFRICAN AMERICAN: 52 mL/min/{1.73_m2} — AB (ref >=60)
GFR, Est African American: 60 mL/min/{1.73_m2} (ref >=60)
GLOBULIN: 3 g/dL (ref 1.9–3.7)
Glucose, Bld: 89 mg/dL (ref 65–99)
Potassium: 4.2 mmol/L (ref 3.5–5.3)
SODIUM: 137 mmol/L (ref 135–146)
Total Bilirubin: 0.5 mg/dL (ref 0.2–1.2)
Total Protein: 7.2 g/dL (ref 6.1–8.1)

## 2017-01-16 LAB — TSH: TSH: 1.36 mIU/L (ref 0.40–4.50)

## 2017-01-16 MED ORDER — AZITHROMYCIN 250 MG PO TABS: ORAL_TABLET | ORAL | 0 refills | Status: DC

## 2017-01-16 MED ORDER — PREDNISONE 10 MG PO TABS: ORAL_TABLET | ORAL | 0 refills | Status: DC

## 2017-01-16 NOTE — Progress Notes (Signed)
   Subjective:    Patient ID: Melinda Payne, female    DOB: 27-Mar-1940, 76 y.o.   MRN: [id number]  HPI Last seen October 29th with respiratory infection after coming back from mission trip to mountains [place]. Dx with bronchitis and maxillary sinusitis as well as BOM. Treated with Rocephin and Biaxin as well as Hycodan.   Now complaining of severe fatigue, ears popping and not hearing well.  C/o extreme fatigue.    Review of Systems see above     Objective:   Physical Exam  Left TM full not red. Right TM chronically scarred. Audioscope 2 shows normal hearing left ear at 40 decibels and no tones heard at 40 decibels left ear.      Assessment & Plan:  Hearing loss left ear  Probable bilateral serous otitis media  Fatigue-etiology unclear check lab work including TSH since she has hypothyroidism  Plan: Zithromax Z-Pak take 2 tabs day 1 followed by 1 tab days 2 through 5.  Prednisone 10 mg going from 60 mg to 0 mg over 7 days.  Addendum: Lab work is normal.  If symptoms persist with regard to popping in ears and hearing loss, she will be referred to ENT physician.

## 2017-01-16 NOTE — Patient Instructions (Signed)
Zithromax Z pak and tapering course of prednisone. To ENT if no better in 2 weeks. Labs drawn.

## 2017-01-17 ENCOUNTER — Telehealth: Payer: Self-pay

## 2017-01-17 NOTE — Telephone Encounter (Signed)
No answer phone just rang and rang

## 2017-01-17 NOTE — Telephone Encounter (Signed)
-----  Message from Elby Showers, MD sent at 01/17/2017 10:48 AM EST ----- TSH, CBC and C-met all OK- done because of fatigue

## 2017-01-22 ENCOUNTER — Other Ambulatory Visit: Payer: Medicare HMO | Admitting: Internal Medicine

## 2017-01-22 DIAGNOSIS — E785 Hyperlipidemia, unspecified: Secondary | ICD-10-CM

## 2017-01-22 DIAGNOSIS — F419 Anxiety disorder, unspecified: Secondary | ICD-10-CM

## 2017-01-22 DIAGNOSIS — G4700 Insomnia, unspecified: Secondary | ICD-10-CM

## 2017-01-22 DIAGNOSIS — E559 Vitamin D deficiency, unspecified: Secondary | ICD-10-CM | POA: Diagnosis not present

## 2017-01-22 DIAGNOSIS — E039 Hypothyroidism, unspecified: Secondary | ICD-10-CM | POA: Diagnosis not present

## 2017-01-22 DIAGNOSIS — M8580 Other specified disorders of bone density and structure, unspecified site: Secondary | ICD-10-CM | POA: Diagnosis not present

## 2017-01-22 DIAGNOSIS — Z0000 Encounter for general adult medical examination without abnormal findings: Secondary | ICD-10-CM | POA: Diagnosis not present

## 2017-01-22 DIAGNOSIS — R69 Illness, unspecified: Secondary | ICD-10-CM | POA: Diagnosis not present

## 2017-01-22 DIAGNOSIS — I10 Essential (primary) hypertension: Secondary | ICD-10-CM | POA: Diagnosis not present

## 2017-01-22 LAB — LIPID PANEL
CHOLESTEROL: 215 mg/dL — AB (ref <200)
HDL: 100 mg/dL (ref >50)
LDL CHOLESTEROL (CALC): 99 mg/dL
Non-HDL Cholesterol (Calc): 115 mg/dL (calc) (ref <130)
Total CHOL/HDL Ratio: 2.2 (calc) (ref <5.0)
Triglycerides: 75 mg/dL (ref <150)

## 2017-01-22 LAB — CBC WITH DIFFERENTIAL/PLATELET
BASOS ABS: 22 {cells}/uL (ref 0–200)
Basophils Relative: 0.2 %
EOS PCT: 0 %
Eosinophils Absolute: 0 cells/uL — ABNORMAL LOW (ref 15–500)
HCT: 37.6 % (ref 35.0–45.0)
Hemoglobin: 12.4 g/dL (ref 11.7–15.5)
Lymphs Abs: 2475 cells/uL (ref 850–3900)
MCH: 28.5 pg (ref 27.0–33.0)
MCHC: 33 g/dL (ref 32.0–36.0)
MCV: 86.4 fL (ref 80.0–100.0)
MONOS PCT: 7.1 %
MPV: 10 fL (ref 7.5–12.5)
NEUTROS ABS: 7814 {cells}/uL — AB (ref 1500–7800)
Neutrophils Relative %: 70.4 %
PLATELETS: 276 10*3/uL (ref 140–400)
RBC: 4.35 10*6/uL (ref 3.80–5.10)
RDW: 12.6 % (ref 11.0–15.0)
Total Lymphocyte: 22.3 %
WBC mixed population: 788 cells/uL (ref 200–950)
WBC: 11.1 10*3/uL — ABNORMAL HIGH (ref 3.8–10.8)

## 2017-01-22 LAB — COMPLETE METABOLIC PANEL WITH GFR
AG Ratio: 1.4 (calc) (ref 1.0–2.5)
ALKALINE PHOSPHATASE (APISO): 63 U/L (ref 33–130)
ALT: 11 U/L (ref 6–29)
AST: 14 U/L (ref 10–35)
Albumin: 4 g/dL (ref 3.6–5.1)
BILIRUBIN TOTAL: 0.6 mg/dL (ref 0.2–1.2)
BUN/Creatinine Ratio: 21 (calc) (ref 6–22)
BUN: 26 mg/dL — AB (ref 7–25)
CHLORIDE: 94 mmol/L — AB (ref 98–110)
CO2: 33 mmol/L — ABNORMAL HIGH (ref 20–32)
Calcium: 9.7 mg/dL (ref 8.6–10.4)
Creat: 1.21 mg/dL — ABNORMAL HIGH (ref 0.60–0.93)
GFR, Est African American: 51 mL/min/{1.73_m2} — ABNORMAL LOW (ref >=60)
GFR, Est Non African American: 44 mL/min/{1.73_m2} — ABNORMAL LOW (ref >=60)
GLUCOSE: 92 mg/dL (ref 65–99)
Globulin: 2.9 g/dL (calc) (ref 1.9–3.7)
Potassium: 4.4 mmol/L (ref 3.5–5.3)
Sodium: 135 mmol/L (ref 135–146)
TOTAL PROTEIN: 6.9 g/dL (ref 6.1–8.1)

## 2017-01-22 LAB — TSH: TSH: 1.31 mIU/L (ref 0.40–4.50)

## 2017-01-24 ENCOUNTER — Encounter: Payer: Self-pay | Admitting: Internal Medicine

## 2017-01-24 ENCOUNTER — Ambulatory Visit (INDEPENDENT_AMBULATORY_CARE_PROVIDER_SITE_OTHER): Payer: Medicare HMO | Admitting: Internal Medicine

## 2017-01-24 ENCOUNTER — Other Ambulatory Visit: Payer: Self-pay | Admitting: Internal Medicine

## 2017-01-24 VITALS — BP 150/80 | HR 74 | Temp 97.6°F | Ht 61.0 in | Wt 147.0 lb

## 2017-01-24 DIAGNOSIS — E039 Hypothyroidism, unspecified: Secondary | ICD-10-CM

## 2017-01-24 DIAGNOSIS — M8580 Other specified disorders of bone density and structure, unspecified site: Secondary | ICD-10-CM | POA: Diagnosis not present

## 2017-01-24 DIAGNOSIS — G4700 Insomnia, unspecified: Secondary | ICD-10-CM

## 2017-01-24 DIAGNOSIS — Z85820 Personal history of malignant melanoma of skin: Secondary | ICD-10-CM

## 2017-01-24 DIAGNOSIS — Z0000 Encounter for general adult medical examination without abnormal findings: Secondary | ICD-10-CM

## 2017-01-24 DIAGNOSIS — R0989 Other specified symptoms and signs involving the circulatory and respiratory systems: Secondary | ICD-10-CM

## 2017-01-24 DIAGNOSIS — E7800 Pure hypercholesterolemia, unspecified: Secondary | ICD-10-CM | POA: Diagnosis not present

## 2017-01-24 DIAGNOSIS — N183 Chronic kidney disease, stage 3 unspecified: Secondary | ICD-10-CM

## 2017-01-24 DIAGNOSIS — J069 Acute upper respiratory infection, unspecified: Secondary | ICD-10-CM

## 2017-01-24 DIAGNOSIS — H6503 Acute serous otitis media, bilateral: Secondary | ICD-10-CM

## 2017-01-24 DIAGNOSIS — H9192 Unspecified hearing loss, left ear: Secondary | ICD-10-CM | POA: Diagnosis not present

## 2017-01-24 LAB — POCT URINALYSIS DIPSTICK
BILIRUBIN UA: NEGATIVE
Blood, UA: NEGATIVE
GLUCOSE UA: NEGATIVE
KETONES UA: NEGATIVE
Leukocytes, UA: NEGATIVE
Nitrite, UA: NEGATIVE
Protein, UA: NEGATIVE
SPEC GRAV UA: 1.025 (ref 1.010–1.025)
Urobilinogen, UA: 0.2 E.U./dL
pH, UA: 8 (ref 5.0–8.0)

## 2017-01-24 MED ORDER — METHYLPREDNISOLONE ACETATE 80 MG/ML IJ SUSP
80.0000 mg | Freq: Once | INTRAMUSCULAR | Status: AC
  Administered 2017-01-24: 80 mg via INTRAMUSCULAR

## 2017-01-24 MED ORDER — CLINDAMYCIN HCL 300 MG PO CAPS: 300.0000 mg | ORAL_CAPSULE | Freq: Three times a day (TID) | ORAL | 0 refills | Status: DC
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
## 2017-01-30 ENCOUNTER — Ambulatory Visit
Admission: RE | Admit: 2017-01-30 | Discharge: 2017-01-30 | Disposition: A | Discharge status: Home / Self Care | Payer: Medicare HMO | Source: Ambulatory Visit | Attending: Internal Medicine | Admitting: Internal Medicine

## 2017-01-30 DIAGNOSIS — J329 Chronic sinusitis, unspecified: Secondary | ICD-10-CM | POA: Diagnosis not present

## 2017-01-30 DIAGNOSIS — J069 Acute upper respiratory infection, unspecified: Secondary | ICD-10-CM

## 2017-02-02 DIAGNOSIS — D631 Anemia in chronic kidney disease: Secondary | ICD-10-CM | POA: Diagnosis not present

## 2017-02-02 DIAGNOSIS — I129 Hypertensive chronic kidney disease with stage 1 through stage 4 chronic kidney disease, or unspecified chronic kidney disease: Secondary | ICD-10-CM | POA: Diagnosis not present

## 2017-02-02 DIAGNOSIS — N2581 Secondary hyperparathyroidism of renal origin: Secondary | ICD-10-CM | POA: Diagnosis not present

## 2017-02-02 DIAGNOSIS — N183 Chronic kidney disease, stage 3 (moderate): Secondary | ICD-10-CM | POA: Diagnosis not present

## 2017-02-05 ENCOUNTER — Other Ambulatory Visit: Payer: Medicare HMO | Admitting: Internal Medicine

## 2017-02-05 DIAGNOSIS — J019 Acute sinusitis, unspecified: Secondary | ICD-10-CM | POA: Insufficient documentation

## 2017-02-08 ENCOUNTER — Encounter: Payer: Medicare HMO | Admitting: Internal Medicine

## 2017-02-15 ENCOUNTER — Encounter: Payer: Self-pay | Admitting: Internal Medicine

## 2017-02-15 ENCOUNTER — Ambulatory Visit (INDEPENDENT_AMBULATORY_CARE_PROVIDER_SITE_OTHER): Payer: Medicare HMO | Admitting: Internal Medicine

## 2017-02-15 VITALS — BP 138/88 | HR 88 | Temp 98.2°F

## 2017-02-15 DIAGNOSIS — Z1231 Encounter for screening mammogram for malignant neoplasm of breast: Secondary | ICD-10-CM | POA: Diagnosis not present

## 2017-02-15 DIAGNOSIS — Z23 Encounter for immunization: Secondary | ICD-10-CM

## 2017-02-15 NOTE — Patient Instructions (Signed)
Pneumovax 23 given today

## 2017-02-15 NOTE — Progress Notes (Signed)
Pneumovax 23 given today

## 2017-03-04 ENCOUNTER — Encounter: Payer: Self-pay | Admitting: Internal Medicine

## 2017-03-04 NOTE — Patient Instructions (Signed)
See ENT physician.  Take 7-day course of Cleocin.  Continue other medications as previously prescribed.  Stay well-hydrated.  Follow-up in 6 months.  Have CT of the sinuses.

## 2017-04-06 ENCOUNTER — Other Ambulatory Visit: Payer: Self-pay | Admitting: Internal Medicine

## 2017-04-06 NOTE — Telephone Encounter (Signed)
Refill x 6 months 

## 2017-04-18 DIAGNOSIS — H52203 Unspecified astigmatism, bilateral: Secondary | ICD-10-CM | POA: Diagnosis not present

## 2017-04-18 DIAGNOSIS — D3132 Benign neoplasm of left choroid: Secondary | ICD-10-CM | POA: Diagnosis not present

## 2017-04-18 DIAGNOSIS — H1859 Other hereditary corneal dystrophies: Secondary | ICD-10-CM | POA: Diagnosis not present

## 2017-04-18 DIAGNOSIS — Z961 Presence of intraocular lens: Secondary | ICD-10-CM | POA: Diagnosis not present

## 2017-05-18 ENCOUNTER — Other Ambulatory Visit: Payer: Self-pay | Admitting: Internal Medicine

## 2017-05-18 NOTE — Telephone Encounter (Signed)
Refill x 6 months 

## 2017-05-24 DIAGNOSIS — L570 Actinic keratosis: Secondary | ICD-10-CM | POA: Diagnosis not present

## 2017-05-24 DIAGNOSIS — Z85820 Personal history of malignant melanoma of skin: Secondary | ICD-10-CM | POA: Diagnosis not present

## 2017-05-24 DIAGNOSIS — L821 Other seborrheic keratosis: Secondary | ICD-10-CM | POA: Diagnosis not present

## 2017-05-26 ENCOUNTER — Other Ambulatory Visit: Payer: Self-pay | Admitting: Internal Medicine

## 2017-06-26 ENCOUNTER — Encounter: Payer: Self-pay | Admitting: Internal Medicine

## 2017-06-26 DIAGNOSIS — M85851 Other specified disorders of bone density and structure, right thigh: Secondary | ICD-10-CM | POA: Diagnosis not present

## 2017-08-16 ENCOUNTER — Other Ambulatory Visit: Payer: Self-pay | Admitting: Internal Medicine

## 2017-09-11 DIAGNOSIS — L821 Other seborrheic keratosis: Secondary | ICD-10-CM | POA: Diagnosis not present

## 2017-09-11 DIAGNOSIS — Z85820 Personal history of malignant melanoma of skin: Secondary | ICD-10-CM | POA: Diagnosis not present

## 2017-09-11 DIAGNOSIS — D2261 Melanocytic nevi of right upper limb, including shoulder: Secondary | ICD-10-CM | POA: Diagnosis not present

## 2017-09-11 DIAGNOSIS — D1801 Hemangioma of skin and subcutaneous tissue: Secondary | ICD-10-CM | POA: Diagnosis not present

## 2017-09-11 DIAGNOSIS — L570 Actinic keratosis: Secondary | ICD-10-CM | POA: Diagnosis not present

## 2017-09-11 DIAGNOSIS — D225 Melanocytic nevi of trunk: Secondary | ICD-10-CM | POA: Diagnosis not present

## 2017-09-27 DIAGNOSIS — R69 Illness, unspecified: Secondary | ICD-10-CM | POA: Diagnosis not present

## 2017-10-04 ENCOUNTER — Other Ambulatory Visit: Payer: Self-pay | Admitting: Internal Medicine

## 2017-10-18 ENCOUNTER — Other Ambulatory Visit: Payer: Self-pay | Admitting: Internal Medicine

## 2017-11-19 ENCOUNTER — Other Ambulatory Visit: Payer: Self-pay | Admitting: Internal Medicine

## 2017-11-22 ENCOUNTER — Other Ambulatory Visit: Payer: Self-pay | Admitting: Internal Medicine

## 2017-11-23 NOTE — Telephone Encounter (Signed)
Please call pharmacy. I just refilled a Xanax Rx for her this week. Did they not receive It?

## 2017-11-23 NOTE — Telephone Encounter (Signed)
I left her a message to call me I need to know which dose she is taking we have two doses on file. XANAX 0.25MG  was filled on 10/05/17 #60 +5 XANAX 1mg  was filled on 05/21/17 #30 +5

## 2017-11-25 DIAGNOSIS — R69 Illness, unspecified: Secondary | ICD-10-CM | POA: Diagnosis not present

## 2017-12-20 ENCOUNTER — Other Ambulatory Visit: Payer: Self-pay | Admitting: Internal Medicine

## 2018-01-14 ENCOUNTER — Other Ambulatory Visit: Payer: Self-pay | Admitting: Internal Medicine

## 2018-01-30 ENCOUNTER — Other Ambulatory Visit: Payer: Self-pay | Admitting: Internal Medicine

## 2018-01-30 DIAGNOSIS — H9192 Unspecified hearing loss, left ear: Secondary | ICD-10-CM

## 2018-01-30 DIAGNOSIS — M8580 Other specified disorders of bone density and structure, unspecified site: Secondary | ICD-10-CM

## 2018-01-30 DIAGNOSIS — N183 Chronic kidney disease, stage 3 unspecified: Secondary | ICD-10-CM

## 2018-01-30 DIAGNOSIS — E039 Hypothyroidism, unspecified: Secondary | ICD-10-CM

## 2018-01-30 DIAGNOSIS — Z0000 Encounter for general adult medical examination without abnormal findings: Secondary | ICD-10-CM

## 2018-01-30 DIAGNOSIS — G4700 Insomnia, unspecified: Secondary | ICD-10-CM

## 2018-01-30 DIAGNOSIS — R0989 Other specified symptoms and signs involving the circulatory and respiratory systems: Secondary | ICD-10-CM

## 2018-01-30 DIAGNOSIS — E7800 Pure hypercholesterolemia, unspecified: Secondary | ICD-10-CM

## 2018-02-05 ENCOUNTER — Encounter: Payer: Medicare HMO | Admitting: Internal Medicine

## 2018-02-05 ENCOUNTER — Other Ambulatory Visit: Payer: Medicare HMO | Admitting: Internal Medicine

## 2018-02-05 DIAGNOSIS — M8580 Other specified disorders of bone density and structure, unspecified site: Secondary | ICD-10-CM | POA: Diagnosis not present

## 2018-02-05 DIAGNOSIS — H9192 Unspecified hearing loss, left ear: Secondary | ICD-10-CM | POA: Diagnosis not present

## 2018-02-05 DIAGNOSIS — Z0000 Encounter for general adult medical examination without abnormal findings: Secondary | ICD-10-CM

## 2018-02-05 DIAGNOSIS — N183 Chronic kidney disease, stage 3 unspecified: Secondary | ICD-10-CM

## 2018-02-05 DIAGNOSIS — E039 Hypothyroidism, unspecified: Secondary | ICD-10-CM

## 2018-02-05 DIAGNOSIS — G4700 Insomnia, unspecified: Secondary | ICD-10-CM | POA: Diagnosis not present

## 2018-02-05 DIAGNOSIS — R0989 Other specified symptoms and signs involving the circulatory and respiratory systems: Secondary | ICD-10-CM

## 2018-02-05 DIAGNOSIS — E7800 Pure hypercholesterolemia, unspecified: Secondary | ICD-10-CM | POA: Diagnosis not present

## 2018-02-06 LAB — CBC WITH DIFFERENTIAL/PLATELET
BASOS PCT: 0.2 %
Basophils Absolute: 11 cells/uL (ref 0–200)
EOS ABS: 91 {cells}/uL (ref 15–500)
Eosinophils Relative: 1.6 %
HEMATOCRIT: 33 % — AB (ref 35.0–45.0)
Hemoglobin: 11.1 g/dL — ABNORMAL LOW (ref 11.7–15.5)
Lymphs Abs: 2428 cells/uL (ref 850–3900)
MCH: 31.4 pg (ref 27.0–33.0)
MCHC: 33.6 g/dL (ref 32.0–36.0)
MCV: 93.2 fL (ref 80.0–100.0)
MONOS PCT: 9 %
MPV: 9.8 fL (ref 7.5–12.5)
NEUTROS PCT: 46.6 %
Neutro Abs: 2656 cells/uL (ref 1500–7800)
PLATELETS: 201 10*3/uL (ref 140–400)
RBC: 3.54 10*6/uL — ABNORMAL LOW (ref 3.80–5.10)
RDW: 11.8 % (ref 11.0–15.0)
TOTAL LYMPHOCYTE: 42.6 %
WBC: 5.7 10*3/uL (ref 3.8–10.8)
WBCMIX: 513 {cells}/uL (ref 200–950)

## 2018-02-06 LAB — COMPLETE METABOLIC PANEL WITH GFR
AG RATIO: 1.8 (calc) (ref 1.0–2.5)
ALBUMIN MSPROF: 4.3 g/dL (ref 3.6–5.1)
ALKALINE PHOSPHATASE (APISO): 60 U/L (ref 33–130)
ALT: 12 U/L (ref 6–29)
AST: 19 U/L (ref 10–35)
BUN / CREAT RATIO: 17 (calc) (ref 6–22)
BUN: 21 mg/dL (ref 7–25)
CO2: 33 mmol/L — ABNORMAL HIGH (ref 20–32)
Calcium: 9.9 mg/dL (ref 8.6–10.4)
Chloride: 102 mmol/L (ref 98–110)
Creat: 1.25 mg/dL — ABNORMAL HIGH (ref 0.60–0.93)
GFR, EST NON AFRICAN AMERICAN: 42 mL/min/{1.73_m2} — AB (ref >=60)
GFR, Est African American: 49 mL/min/{1.73_m2} — ABNORMAL LOW (ref >=60)
GLOBULIN: 2.4 g/dL (ref 1.9–3.7)
Glucose, Bld: 93 mg/dL (ref 65–99)
POTASSIUM: 4.7 mmol/L (ref 3.5–5.3)
SODIUM: 142 mmol/L (ref 135–146)
Total Bilirubin: 0.6 mg/dL (ref 0.2–1.2)
Total Protein: 6.7 g/dL (ref 6.1–8.1)

## 2018-02-06 LAB — LIPID PANEL
CHOL/HDL RATIO: 2.1 (calc) (ref <5.0)
CHOLESTEROL: 193 mg/dL (ref <200)
HDL: 92 mg/dL (ref >50)
LDL CHOLESTEROL (CALC): 90 mg/dL
Non-HDL Cholesterol (Calc): 101 mg/dL (calc) (ref <130)
Triglycerides: 38 mg/dL (ref <150)

## 2018-02-06 LAB — TSH: TSH: 3.23 m[IU]/L (ref 0.40–4.50)

## 2018-02-07 ENCOUNTER — Ambulatory Visit (INDEPENDENT_AMBULATORY_CARE_PROVIDER_SITE_OTHER): Payer: Medicare HMO | Admitting: Internal Medicine

## 2018-02-07 ENCOUNTER — Encounter: Payer: Self-pay | Admitting: Internal Medicine

## 2018-02-07 VITALS — BP 130/90 | HR 65 | Ht 61.0 in | Wt 152.0 lb

## 2018-02-07 DIAGNOSIS — R69 Illness, unspecified: Secondary | ICD-10-CM | POA: Diagnosis not present

## 2018-02-07 DIAGNOSIS — Z8542 Personal history of malignant neoplasm of other parts of uterus: Secondary | ICD-10-CM | POA: Diagnosis not present

## 2018-02-07 DIAGNOSIS — Z0000 Encounter for general adult medical examination without abnormal findings: Secondary | ICD-10-CM | POA: Diagnosis not present

## 2018-02-07 DIAGNOSIS — Z85820 Personal history of malignant melanoma of skin: Secondary | ICD-10-CM

## 2018-02-07 DIAGNOSIS — I10 Essential (primary) hypertension: Secondary | ICD-10-CM

## 2018-02-07 DIAGNOSIS — R0989 Other specified symptoms and signs involving the circulatory and respiratory systems: Secondary | ICD-10-CM | POA: Diagnosis not present

## 2018-02-07 DIAGNOSIS — E039 Hypothyroidism, unspecified: Secondary | ICD-10-CM | POA: Diagnosis not present

## 2018-02-07 DIAGNOSIS — E7800 Pure hypercholesterolemia, unspecified: Secondary | ICD-10-CM

## 2018-02-07 DIAGNOSIS — M8580 Other specified disorders of bone density and structure, unspecified site: Secondary | ICD-10-CM

## 2018-02-07 DIAGNOSIS — G4700 Insomnia, unspecified: Secondary | ICD-10-CM | POA: Diagnosis not present

## 2018-02-07 DIAGNOSIS — N183 Chronic kidney disease, stage 3 unspecified: Secondary | ICD-10-CM

## 2018-02-07 DIAGNOSIS — F411 Generalized anxiety disorder: Secondary | ICD-10-CM

## 2018-02-07 LAB — POCT URINALYSIS DIPSTICK
Appearance: NEGATIVE
Bilirubin, UA: NEGATIVE
Blood, UA: NEGATIVE
GLUCOSE UA: NEGATIVE
Ketones, UA: NEGATIVE
LEUKOCYTES UA: NEGATIVE
NITRITE UA: NEGATIVE
ODOR: NEGATIVE
PROTEIN UA: NEGATIVE
Spec Grav, UA: 1.01 (ref 1.010–1.025)
Urobilinogen, UA: 0.2 E.U./dL
pH, UA: 6.5 (ref 5.0–8.0)

## 2018-02-07 NOTE — Patient Instructions (Addendum)
It was a pleasure to see you today.  Continue current medications and follow-up in 6 months. 
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
## 2018-02-17 ENCOUNTER — Other Ambulatory Visit: Payer: Self-pay | Admitting: Internal Medicine

## 2018-02-18 ENCOUNTER — Encounter: Payer: Self-pay | Admitting: Internal Medicine

## 2018-02-18 DIAGNOSIS — Z1231 Encounter for screening mammogram for malignant neoplasm of breast: Secondary | ICD-10-CM | POA: Diagnosis not present

## 2018-02-25 ENCOUNTER — Encounter: Payer: Self-pay | Admitting: Internal Medicine

## 2018-02-25 ENCOUNTER — Ambulatory Visit (INDEPENDENT_AMBULATORY_CARE_PROVIDER_SITE_OTHER): Payer: Medicare HMO | Admitting: Internal Medicine

## 2018-02-25 VITALS — BP 140/90 | HR 78 | Temp 98.3°F | Ht 61.0 in | Wt 154.0 lb

## 2018-02-25 DIAGNOSIS — I10 Essential (primary) hypertension: Secondary | ICD-10-CM | POA: Diagnosis not present

## 2018-02-25 DIAGNOSIS — F411 Generalized anxiety disorder: Secondary | ICD-10-CM | POA: Diagnosis not present

## 2018-02-25 DIAGNOSIS — N183 Chronic kidney disease, stage 3 unspecified: Secondary | ICD-10-CM

## 2018-02-25 DIAGNOSIS — R69 Illness, unspecified: Secondary | ICD-10-CM | POA: Diagnosis not present

## 2018-02-25 DIAGNOSIS — J22 Unspecified acute lower respiratory infection: Secondary | ICD-10-CM | POA: Diagnosis not present

## 2018-02-25 MED ORDER — CLARITHROMYCIN 500 MG PO TABS: 500.0000 mg | ORAL_TABLET | Freq: Two times a day (BID) | ORAL | 0 refills | Status: DC

## 2018-02-25 MED ORDER — CEFTRIAXONE SODIUM 1 G IJ SOLR
1.0000 g | Freq: Once | INTRAMUSCULAR | Status: AC
  Administered 2018-02-25: 1 g via INTRAMUSCULAR

## 2018-02-25 NOTE — Progress Notes (Signed)
   Subjective:    Patient ID: Melinda Payne, female    DOB: [birth year], 77 y.o.   MRN: [id number]  HPI 77 year old Female has come down with respiratory infection symptoms.  Has cough, congestion, malaise and fatigue.  May have gotten this at church.  No recent travel history.    Review of Systems see above     Objective:   Physical Exam Looks fatigued and sounds congested with congested cough Skin warm and dry.  Nodes none.  Pharynx slightly injected.  Neck is supple.  Chest clear to auscultation.  Cardiac exam regular rate and rhythm.  TMs and pharynx are clear.      Assessment & Plan:  Acute lower respiratory infection  Plan: Hycodan 1 teaspoon p.o. every 8 hours as needed cough.  Rocephin 1 g IM given in office today.  Biaxin 500 mg twice daily for 10 days.  Rest and drink plenty of fluids.

## 2018-02-28 MED ORDER — HYDROCODONE-HOMATROPINE 5-1.5 MG/5ML PO SYRP: 5.0000 mL | ORAL_SOLUTION | Freq: Three times a day (TID) | ORAL | 0 refills | Status: DC | PRN

## 2018-03-05 NOTE — Patient Instructions (Signed)
Cough.  Rest and drink plenty of fluids.Rocephin 1 g IM.  Biaxin 500 mg twice daily for 10 days.  Hycodan 1 teaspoon p.o.

## 2018-03-07 ENCOUNTER — Ambulatory Visit
Admission: RE | Admit: 2018-03-07 | Discharge: 2018-03-07 | Disposition: A | Discharge status: Home / Self Care | Payer: Medicare HMO | Source: Ambulatory Visit | Attending: Internal Medicine | Admitting: Internal Medicine

## 2018-03-07 ENCOUNTER — Ambulatory Visit (INDEPENDENT_AMBULATORY_CARE_PROVIDER_SITE_OTHER): Payer: Medicare HMO | Admitting: Internal Medicine

## 2018-03-07 ENCOUNTER — Encounter: Payer: Self-pay | Admitting: Internal Medicine

## 2018-03-07 VITALS — BP 140/100 | HR 90 | Temp 98.3°F | Ht 61.0 in | Wt 156.0 lb

## 2018-03-07 DIAGNOSIS — J22 Unspecified acute lower respiratory infection: Secondary | ICD-10-CM

## 2018-03-07 DIAGNOSIS — R05 Cough: Secondary | ICD-10-CM

## 2018-03-07 DIAGNOSIS — R059 Cough, unspecified: Secondary | ICD-10-CM

## 2018-03-07 DIAGNOSIS — R5383 Other fatigue: Secondary | ICD-10-CM

## 2018-03-07 DIAGNOSIS — J069 Acute upper respiratory infection, unspecified: Secondary | ICD-10-CM | POA: Diagnosis not present

## 2018-03-07 LAB — CBC WITH DIFFERENTIAL/PLATELET
ABSOLUTE MONOCYTES: 482 {cells}/uL (ref 200–950)
BASOS PCT: 0.2 %
Basophils Absolute: 13 cells/uL (ref 0–200)
Eosinophils Absolute: 40 cells/uL (ref 15–500)
Eosinophils Relative: 0.6 %
HCT: 39.9 % (ref 35.0–45.0)
Hemoglobin: 13.3 g/dL (ref 11.7–15.5)
Lymphs Abs: 2633 cells/uL (ref 850–3900)
MCH: 31.1 pg (ref 27.0–33.0)
MCHC: 33.3 g/dL (ref 32.0–36.0)
MCV: 93.2 fL (ref 80.0–100.0)
MPV: 10.3 fL (ref 7.5–12.5)
Monocytes Relative: 7.3 %
Neutro Abs: 3432 cells/uL (ref 1500–7800)
Neutrophils Relative %: 52 %
Platelets: 215 10*3/uL (ref 140–400)
RBC: 4.28 10*6/uL (ref 3.80–5.10)
RDW: 11.8 % (ref 11.0–15.0)
Total Lymphocyte: 39.9 %
WBC: 6.6 10*3/uL (ref 3.8–10.8)

## 2018-03-07 MED ORDER — DOXYCYCLINE HYCLATE 100 MG PO TABS: 100.0000 mg | ORAL_TABLET | Freq: Two times a day (BID) | ORAL | 0 refills | Status: DC

## 2018-03-07 MED ORDER — HYDROCODONE-HOMATROPINE 5-1.5 MG/5ML PO SYRP: 5.0000 mL | ORAL_SOLUTION | Freq: Three times a day (TID) | ORAL | 0 refills | Status: DC | PRN

## 2018-03-07 NOTE — Progress Notes (Signed)
   Subjective:    Patient ID: Melinda Payne, female    DOB: 1940/07/25, 78 y.o.   MRN: [id number]  HPI Patient was seen here December 23 with acute lower respiratory infection.  At that time she thought she may have gotten viral infection from church.  Had no recent travel history.  Sounded nasally congested with congested cough.  Was treated with Rocephin IM, Hycodan, and Biaxin for 10 days.  She is failed to improve and is fatigued with being sick.    Review of Systems see above-little sputum production     Objective:   Physical Exam Vital signs reviewed.  Blood pressure 140/100.  She is afebrile.  Respiratory rate is normal with a pulse oximetry of 98%.  Chest x-ray shows lungs to be hyperexpanded without infiltrates.  CBC is within normal limits.  Creatinine is slightly elevated at 1.23 but she has a history of mild chronic kidney disease.  Electrolytes are normal and liver functions are normal.       Assessment & Plan:  Protracted respiratory infection  Plan: Hycodan 1 teaspoon p.o. every 8 hours as needed cough.  Changed to doxycycline 100 mg twice daily for 10 days.  Discontinue Biaxin.  Rest and drink plenty of fluids.  Stay at home until improved.

## 2018-03-08 DIAGNOSIS — M79672 Pain in left foot: Secondary | ICD-10-CM | POA: Diagnosis not present

## 2018-03-08 DIAGNOSIS — M2041 Other hammer toe(s) (acquired), right foot: Secondary | ICD-10-CM | POA: Diagnosis not present

## 2018-03-08 DIAGNOSIS — M2042 Other hammer toe(s) (acquired), left foot: Secondary | ICD-10-CM | POA: Diagnosis not present

## 2018-03-08 LAB — COMPLETE METABOLIC PANEL WITH GFR
AG Ratio: 1.6 (calc) (ref 1.0–2.5)
ALT: 16 U/L (ref 6–29)
AST: 24 U/L (ref 10–35)
Albumin: 4.4 g/dL (ref 3.6–5.1)
Alkaline phosphatase (APISO): 76 U/L (ref 33–130)
BUN/Creatinine Ratio: 15 (calc) (ref 6–22)
BUN: 18 mg/dL (ref 7–25)
CO2: 36 mmol/L — ABNORMAL HIGH (ref 20–32)
Calcium: 10.3 mg/dL (ref 8.6–10.4)
Chloride: 98 mmol/L (ref 98–110)
Creat: 1.23 mg/dL — ABNORMAL HIGH (ref 0.60–0.93)
GFR, Est African American: 50 mL/min/{1.73_m2} — ABNORMAL LOW (ref >=60)
GFR, Est Non African American: 43 mL/min/{1.73_m2} — ABNORMAL LOW (ref >=60)
Globulin: 2.8 g/dL (calc) (ref 1.9–3.7)
Glucose, Bld: 86 mg/dL (ref 65–99)
Potassium: 4.4 mmol/L (ref 3.5–5.3)
Sodium: 139 mmol/L (ref 135–146)
Total Bilirubin: 0.7 mg/dL (ref 0.2–1.2)
Total Protein: 7.2 g/dL (ref 6.1–8.1)

## 2018-03-30 NOTE — Patient Instructions (Signed)
Change Biaxin to doxycycline 100 mg twice daily for 10 days.  CBC and C met drawn.  Chest x-ray shows no pneumonia.  Continue Hycodan for cough.  Rest and drink plenty of fluids.  Stay at home until improved.  Stay away from sick individuals. 

## 2018-04-08 DIAGNOSIS — M2042 Other hammer toe(s) (acquired), left foot: Secondary | ICD-10-CM | POA: Diagnosis not present

## 2018-04-08 DIAGNOSIS — M2041 Other hammer toe(s) (acquired), right foot: Secondary | ICD-10-CM | POA: Diagnosis not present

## 2018-04-15 ENCOUNTER — Other Ambulatory Visit: Payer: Self-pay | Admitting: Internal Medicine

## 2018-04-19 DIAGNOSIS — H52203 Unspecified astigmatism, bilateral: Secondary | ICD-10-CM | POA: Diagnosis not present

## 2018-04-19 DIAGNOSIS — Z961 Presence of intraocular lens: Secondary | ICD-10-CM | POA: Diagnosis not present

## 2018-04-19 DIAGNOSIS — H5203 Hypermetropia, bilateral: Secondary | ICD-10-CM | POA: Diagnosis not present

## 2018-04-19 DIAGNOSIS — H1859 Other hereditary corneal dystrophies: Secondary | ICD-10-CM | POA: Diagnosis not present

## 2018-04-23 DIAGNOSIS — N2581 Secondary hyperparathyroidism of renal origin: Secondary | ICD-10-CM | POA: Diagnosis not present

## 2018-04-23 DIAGNOSIS — N189 Chronic kidney disease, unspecified: Secondary | ICD-10-CM | POA: Diagnosis not present

## 2018-04-23 DIAGNOSIS — N183 Chronic kidney disease, stage 3 (moderate): Secondary | ICD-10-CM | POA: Diagnosis not present

## 2018-04-30 DIAGNOSIS — D485 Neoplasm of uncertain behavior of skin: Secondary | ICD-10-CM | POA: Diagnosis not present

## 2018-04-30 DIAGNOSIS — L570 Actinic keratosis: Secondary | ICD-10-CM | POA: Diagnosis not present

## 2018-04-30 DIAGNOSIS — Z85820 Personal history of malignant melanoma of skin: Secondary | ICD-10-CM | POA: Diagnosis not present

## 2018-05-02 DIAGNOSIS — N2581 Secondary hyperparathyroidism of renal origin: Secondary | ICD-10-CM | POA: Diagnosis not present

## 2018-05-02 DIAGNOSIS — I129 Hypertensive chronic kidney disease with stage 1 through stage 4 chronic kidney disease, or unspecified chronic kidney disease: Secondary | ICD-10-CM | POA: Diagnosis not present

## 2018-05-02 DIAGNOSIS — N183 Chronic kidney disease, stage 3 (moderate): Secondary | ICD-10-CM | POA: Diagnosis not present

## 2018-05-02 DIAGNOSIS — D631 Anemia in chronic kidney disease: Secondary | ICD-10-CM | POA: Diagnosis not present

## 2018-05-21 ENCOUNTER — Encounter: Payer: Self-pay | Admitting: Internal Medicine

## 2018-05-29 ENCOUNTER — Other Ambulatory Visit: Payer: Self-pay | Admitting: Internal Medicine

## 2018-05-31 ENCOUNTER — Telehealth: Payer: Self-pay

## 2018-05-31 NOTE — Telephone Encounter (Signed)
Pt was notified of Dr. Baxley's instructions, pt verbalized understanding.   

## 2018-05-31 NOTE — Telephone Encounter (Signed)
Patient is scheduled to donate blood on Monday she wants your opinion whether she should go out and donate blood since 78 years old?

## 2018-05-31 NOTE — Telephone Encounter (Signed)
Wait until this epidemic is over

## 2018-06-20 ENCOUNTER — Encounter: Payer: Self-pay | Admitting: Internal Medicine

## 2018-06-20 DIAGNOSIS — N183 Chronic kidney disease, stage 3 (moderate): Secondary | ICD-10-CM | POA: Diagnosis not present

## 2018-06-24 DIAGNOSIS — H6123 Impacted cerumen, bilateral: Secondary | ICD-10-CM | POA: Diagnosis not present

## 2018-06-27 DIAGNOSIS — D631 Anemia in chronic kidney disease: Secondary | ICD-10-CM | POA: Diagnosis not present

## 2018-06-27 DIAGNOSIS — N2581 Secondary hyperparathyroidism of renal origin: Secondary | ICD-10-CM | POA: Diagnosis not present

## 2018-06-27 DIAGNOSIS — I129 Hypertensive chronic kidney disease with stage 1 through stage 4 chronic kidney disease, or unspecified chronic kidney disease: Secondary | ICD-10-CM | POA: Diagnosis not present

## 2018-06-27 DIAGNOSIS — N183 Chronic kidney disease, stage 3 (moderate): Secondary | ICD-10-CM | POA: Diagnosis not present

## 2018-07-03 DIAGNOSIS — H60333 Swimmer's ear, bilateral: Secondary | ICD-10-CM | POA: Diagnosis not present

## 2018-07-14 ENCOUNTER — Other Ambulatory Visit: Payer: Self-pay | Admitting: Internal Medicine

## 2018-08-13 ENCOUNTER — Other Ambulatory Visit: Payer: Medicare HMO | Admitting: Internal Medicine

## 2018-08-13 ENCOUNTER — Other Ambulatory Visit: Payer: Self-pay

## 2018-08-13 DIAGNOSIS — E785 Hyperlipidemia, unspecified: Secondary | ICD-10-CM | POA: Diagnosis not present

## 2018-08-13 LAB — HEPATIC FUNCTION PANEL
AG Ratio: 1.8 (calc) (ref 1.0–2.5)
ALT: 11 U/L (ref 6–29)
AST: 19 U/L (ref 10–35)
Albumin: 4.4 g/dL (ref 3.6–5.1)
Alkaline phosphatase (APISO): 59 U/L (ref 37–153)
Bilirubin, Direct: 0.2 mg/dL (ref 0.0–0.2)
Globulin: 2.5 g/dL (calc) (ref 1.9–3.7)
Indirect Bilirubin: 0.7 mg/dL (calc) (ref 0.2–1.2)
Total Bilirubin: 0.9 mg/dL (ref 0.2–1.2)
Total Protein: 6.9 g/dL (ref 6.1–8.1)

## 2018-08-13 LAB — LIPID PANEL
Cholesterol: 178 mg/dL (ref <200)
HDL: 86 mg/dL (ref >=50)
LDL Cholesterol (Calc): 79 mg/dL (calc)
Non-HDL Cholesterol (Calc): 92 mg/dL (calc) (ref <130)
Total CHOL/HDL Ratio: 2.1 (calc) (ref <5.0)
Triglycerides: 57 mg/dL (ref <150)

## 2018-08-15 ENCOUNTER — Encounter: Payer: Self-pay | Admitting: Internal Medicine

## 2018-08-15 ENCOUNTER — Other Ambulatory Visit: Payer: Self-pay

## 2018-08-15 ENCOUNTER — Ambulatory Visit (INDEPENDENT_AMBULATORY_CARE_PROVIDER_SITE_OTHER): Payer: Medicare HMO | Admitting: Internal Medicine

## 2018-08-15 VITALS — BP 130/70 | HR 72 | Ht 61.0 in | Wt 156.0 lb

## 2018-08-15 DIAGNOSIS — F411 Generalized anxiety disorder: Secondary | ICD-10-CM

## 2018-08-15 DIAGNOSIS — E7800 Pure hypercholesterolemia, unspecified: Secondary | ICD-10-CM

## 2018-08-15 DIAGNOSIS — N183 Chronic kidney disease, stage 3 unspecified: Secondary | ICD-10-CM

## 2018-08-15 DIAGNOSIS — M8580 Other specified disorders of bone density and structure, unspecified site: Secondary | ICD-10-CM

## 2018-08-15 DIAGNOSIS — R0989 Other specified symptoms and signs involving the circulatory and respiratory systems: Secondary | ICD-10-CM | POA: Diagnosis not present

## 2018-08-15 DIAGNOSIS — I10 Essential (primary) hypertension: Secondary | ICD-10-CM

## 2018-08-15 DIAGNOSIS — E039 Hypothyroidism, unspecified: Secondary | ICD-10-CM

## 2018-08-15 DIAGNOSIS — G4700 Insomnia, unspecified: Secondary | ICD-10-CM

## 2018-08-15 DIAGNOSIS — R69 Illness, unspecified: Secondary | ICD-10-CM | POA: Diagnosis not present

## 2018-08-15 NOTE — Patient Instructions (Signed)
It was a pleasure to see you today.  Continue current medications and follow-up here in 6 months for health maintenance exam and annual Medicare wellness exam.  No change in medications at this time.

## 2018-08-15 NOTE — Progress Notes (Signed)
   Subjective:    Patient ID: Melinda Payne, female    DOB: [birth year], 78 y.o.   MRN: [id number]  HPI 78 year old Female in today for 6 month follow up.   She has a history of hypertension, hyperlipidemia, vitamin D deficiency, right carotid bruit, chronic kidney disease, insomnia, anxiety, lumbar spinal stenosis.  Had visit with Dr. Posey Pronto, Nephrologist recently for Stage 3 kidney disease. Notes reviewed from Dr. Posey Pronto. Creatinine was stable at 1.2. GFR 44 cc/min. Intact PTH and calcium were stable. Lipid panel done her is normal as are liver function. HTN is well controlled.  She is no longer taking calcium supplement.  Has secondary hyperparathyroidism from chronic kidney disease.  Taking care of her elderly sister who has memory issues.  Strong family history of dementia.    Walking a lot for exercise.  Admits to anxiety regarding her sister.  Patient's KGMWN-02 risk of complications score is 3.  He goes to the grocery store and has been out to eat at [employer] recently.  She takes precautions and wears a mask.  Church services are on line.  Immunizations are reviewed and are up-to-date.     Review of Systems no shortness of breath, chest pain, lower extremity edema.  Some fatigue and perhaps mild depression due to COVID-19 situation being unable to get out very much.  History of anxiety.     Objective:   Physical Exam  Blood pressure 130/70 p72 regular, weight 156 pounds.  Gained 2 pounds since last visit.  Neck is supple.  No thyromegaly.  Asymptomatic right carotid bruit.  Chest clear.  Cardiac exam regular rate and rhythm normal S1 and S2.  Extremities without edema.  Skin is warm and dry.  No cervical adenopathy.  She is alert and oriented.      Assessment & Plan:  Essential hypertension-stable on current regimen.  No change in medications.  Stage III chronic kidney disease-followed by Dr. Posey Pronto and is stable  Secondary hyperparathyroidism related to chronic  kidney disease-stopped calcium supplement  History of hyperlipidemia-lipid panel normal on low-dose Zocor  Hypothyroidism-TSH not checked today but was checked recently by Dr. Posey Pronto  History of insomnia-no change in medication  History of anxiety-no change in medication  History of lumbar spinal stenosis-no complaint of back pain today  Strong family history of dementia-continue to watch patient for signs of memory loss  Plan: Return in 6 months for health maintenance exam.  Check with [facility] regarding mammogram.  Bone density study done in 2019.  Continue current medications.  Continue with social distancing and limited excursions.

## 2018-09-04 DIAGNOSIS — Z0100 Encounter for examination of eyes and vision without abnormal findings: Secondary | ICD-10-CM | POA: Diagnosis not present

## 2018-09-12 DIAGNOSIS — L820 Inflamed seborrheic keratosis: Secondary | ICD-10-CM | POA: Diagnosis not present

## 2018-09-12 DIAGNOSIS — D485 Neoplasm of uncertain behavior of skin: Secondary | ICD-10-CM | POA: Diagnosis not present

## 2018-09-12 DIAGNOSIS — Z85820 Personal history of malignant melanoma of skin: Secondary | ICD-10-CM | POA: Diagnosis not present

## 2018-09-12 DIAGNOSIS — L821 Other seborrheic keratosis: Secondary | ICD-10-CM | POA: Diagnosis not present

## 2018-09-12 DIAGNOSIS — D225 Melanocytic nevi of trunk: Secondary | ICD-10-CM | POA: Diagnosis not present

## 2018-09-12 DIAGNOSIS — D1801 Hemangioma of skin and subcutaneous tissue: Secondary | ICD-10-CM | POA: Diagnosis not present

## 2018-09-12 DIAGNOSIS — L570 Actinic keratosis: Secondary | ICD-10-CM | POA: Diagnosis not present

## 2018-10-11 ENCOUNTER — Other Ambulatory Visit: Payer: Self-pay | Admitting: Internal Medicine

## 2018-10-15 ENCOUNTER — Telehealth: Payer: Self-pay

## 2018-10-15 NOTE — Telephone Encounter (Signed)
Schedule appointment?

## 2018-10-15 NOTE — Telephone Encounter (Signed)
Patient called states she recently lost 2 brothers and is taking care of her sister that has dementia. She said her "nerves are shot" and she wants to know if she can increase her XANAX.25MG  from BID to TID? Please advise.

## 2018-10-15 NOTE — Telephone Encounter (Signed)
Set up office visit

## 2018-10-16 ENCOUNTER — Other Ambulatory Visit: Payer: Self-pay

## 2018-10-16 ENCOUNTER — Encounter: Payer: Self-pay | Admitting: Internal Medicine

## 2018-10-16 ENCOUNTER — Ambulatory Visit (INDEPENDENT_AMBULATORY_CARE_PROVIDER_SITE_OTHER): Payer: Medicare HMO | Admitting: Internal Medicine

## 2018-10-16 VITALS — BP 120/90 | HR 82 | Temp 98.1°F | Ht 61.0 in | Wt 157.0 lb

## 2018-10-16 DIAGNOSIS — F411 Generalized anxiety disorder: Secondary | ICD-10-CM

## 2018-10-16 DIAGNOSIS — F439 Reaction to severe stress, unspecified: Secondary | ICD-10-CM | POA: Diagnosis not present

## 2018-10-16 DIAGNOSIS — R69 Illness, unspecified: Secondary | ICD-10-CM | POA: Diagnosis not present

## 2018-10-16 MED ORDER — ALPRAZOLAM 0.25 MG PO TABS: 0.2500 mg | ORAL_TABLET | Freq: Three times a day (TID) | ORAL | 5 refills | Status: DC

## 2018-10-16 NOTE — Progress Notes (Signed)
   Subjective:    Patient ID: Melinda Payne, female    DOB: 1940/10/19, 78 y.o.   MRN: [id number]  HPI 78 year old  Female in to discuss anxiety.  She lost her brother recently due to a farm accident and another brother also died recently of complications of dementia.  She has a sister with dementia that she is trying to help who is living independently.  Patient is experiencing considerable anxiety trying to help her sister who is at times unreasonable.  Patient would like to increase anxiety medication if possible.  Discussion regarding handling of anxiety issues and how to handle her sister's situation better.  Sister is not willing to go to assisted living facility.  Sometimes sister call several times a day.    Review of Systems patient has longstanding issues with anxiety and insomnia.  Seems more anxious today relating this information.  Sometimes tearful.     Objective:   Physical Exam Blood pressure 120/90, pulse 82, temperature 98.1 degrees , BMI 29.66  Her affect is anxious and slightly depressed.  Spent 20 minutes speaking with her about her sister's situation and how to set limits with her sister.       Assessment & Plan:  Anxiety state  Situational stress  Plan: Patient will increase Xanax as follows: She will take Xanax 0.25 mg up to 3 times daily as needed for anxiety and take 1 mg at bedtime.  The bedtime dose is for anxiety and insomnia and she was taking this previously.

## 2018-10-23 NOTE — Patient Instructions (Signed)
Patient will take Xanax 0.25 mg up to 3 times daily to help with anxiety while taking care of her sister.  She will continue with previous prescription for Xanax 1 mg a chance for anxiety and insomnia.

## 2018-11-01 ENCOUNTER — Other Ambulatory Visit: Payer: Self-pay | Admitting: Internal Medicine

## 2018-12-15 ENCOUNTER — Other Ambulatory Visit: Payer: Self-pay | Admitting: Internal Medicine

## 2018-12-17 DIAGNOSIS — R69 Illness, unspecified: Secondary | ICD-10-CM | POA: Diagnosis not present

## 2019-01-06 ENCOUNTER — Other Ambulatory Visit: Payer: Self-pay | Admitting: Internal Medicine

## 2019-02-06 NOTE — Addendum Note (Signed)
Addended by: Mady Haagensen on: 02/06/2019 04:58 PM   Modules accepted: Orders

## 2019-02-07 ENCOUNTER — Other Ambulatory Visit: Payer: Medicare HMO | Admitting: Internal Medicine

## 2019-02-07 ENCOUNTER — Other Ambulatory Visit: Payer: Self-pay

## 2019-02-07 DIAGNOSIS — E039 Hypothyroidism, unspecified: Secondary | ICD-10-CM

## 2019-02-07 DIAGNOSIS — M8580 Other specified disorders of bone density and structure, unspecified site: Secondary | ICD-10-CM

## 2019-02-07 DIAGNOSIS — R0989 Other specified symptoms and signs involving the circulatory and respiratory systems: Secondary | ICD-10-CM | POA: Diagnosis not present

## 2019-02-07 DIAGNOSIS — G4700 Insomnia, unspecified: Secondary | ICD-10-CM

## 2019-02-07 DIAGNOSIS — Z0000 Encounter for general adult medical examination without abnormal findings: Secondary | ICD-10-CM | POA: Diagnosis not present

## 2019-02-07 DIAGNOSIS — H9192 Unspecified hearing loss, left ear: Secondary | ICD-10-CM

## 2019-02-07 DIAGNOSIS — E7800 Pure hypercholesterolemia, unspecified: Secondary | ICD-10-CM

## 2019-02-07 DIAGNOSIS — N183 Chronic kidney disease, stage 3 unspecified: Secondary | ICD-10-CM

## 2019-02-08 LAB — CBC WITH DIFFERENTIAL/PLATELET
Absolute Monocytes: 574 cells/uL (ref 200–950)
Basophils Absolute: 0 cells/uL (ref 0–200)
Basophils Relative: 0 %
Eosinophils Absolute: 52 cells/uL (ref 15–500)
Eosinophils Relative: 0.9 %
HCT: 40.4 % (ref 35.0–45.0)
Hemoglobin: 13.8 g/dL (ref 11.7–15.5)
Lymphs Abs: 2459 cells/uL (ref 850–3900)
MCH: 32.6 pg (ref 27.0–33.0)
MCHC: 34.2 g/dL (ref 32.0–36.0)
MCV: 95.5 fL (ref 80.0–100.0)
MPV: 10.7 fL (ref 7.5–12.5)
Monocytes Relative: 9.9 %
Neutro Abs: 2714 cells/uL (ref 1500–7800)
Neutrophils Relative %: 46.8 %
Platelets: 180 10*3/uL (ref 140–400)
RBC: 4.23 10*6/uL (ref 3.80–5.10)
RDW: 11.3 % (ref 11.0–15.0)
Total Lymphocyte: 42.4 %
WBC: 5.8 10*3/uL (ref 3.8–10.8)

## 2019-02-08 LAB — COMPLETE METABOLIC PANEL WITH GFR
AG Ratio: 1.9 (calc) (ref 1.0–2.5)
ALT: 13 U/L (ref 6–29)
AST: 23 U/L (ref 10–35)
Albumin: 4.7 g/dL (ref 3.6–5.1)
Alkaline phosphatase (APISO): 63 U/L (ref 37–153)
BUN/Creatinine Ratio: 16 (calc) (ref 6–22)
BUN: 23 mg/dL (ref 7–25)
CO2: 31 mmol/L (ref 20–32)
Calcium: 10.6 mg/dL — ABNORMAL HIGH (ref 8.6–10.4)
Chloride: 99 mmol/L (ref 98–110)
Creat: 1.42 mg/dL — ABNORMAL HIGH (ref 0.60–0.93)
GFR, Est African American: 41 mL/min/{1.73_m2} — ABNORMAL LOW (ref >=60)
GFR, Est Non African American: 36 mL/min/{1.73_m2} — ABNORMAL LOW (ref >=60)
Globulin: 2.5 g/dL (calc) (ref 1.9–3.7)
Glucose, Bld: 91 mg/dL (ref 65–99)
Potassium: 4.3 mmol/L (ref 3.5–5.3)
Sodium: 140 mmol/L (ref 135–146)
Total Bilirubin: 0.9 mg/dL (ref 0.2–1.2)
Total Protein: 7.2 g/dL (ref 6.1–8.1)

## 2019-02-08 LAB — LIPID PANEL
Cholesterol: 208 mg/dL — ABNORMAL HIGH (ref <200)
HDL: 87 mg/dL (ref >=50)
LDL Cholesterol (Calc): 107 mg/dL (calc) — ABNORMAL HIGH
Non-HDL Cholesterol (Calc): 121 mg/dL (calc) (ref <130)
Total CHOL/HDL Ratio: 2.4 (calc) (ref <5.0)
Triglycerides: 58 mg/dL (ref <150)

## 2019-02-08 LAB — TSH: TSH: 2.55 mIU/L (ref 0.40–4.50)

## 2019-02-10 ENCOUNTER — Other Ambulatory Visit: Payer: Self-pay

## 2019-02-10 ENCOUNTER — Encounter: Payer: Self-pay | Admitting: Internal Medicine

## 2019-02-10 ENCOUNTER — Ambulatory Visit (INDEPENDENT_AMBULATORY_CARE_PROVIDER_SITE_OTHER): Payer: Medicare HMO | Admitting: Internal Medicine

## 2019-02-10 VITALS — BP 140/90 | HR 60 | Temp 98.0°F | Ht 61.0 in | Wt 157.0 lb

## 2019-02-10 DIAGNOSIS — H903 Sensorineural hearing loss, bilateral: Secondary | ICD-10-CM

## 2019-02-10 DIAGNOSIS — E7800 Pure hypercholesterolemia, unspecified: Secondary | ICD-10-CM

## 2019-02-10 DIAGNOSIS — Z85820 Personal history of malignant melanoma of skin: Secondary | ICD-10-CM | POA: Diagnosis not present

## 2019-02-10 DIAGNOSIS — N1831 Chronic kidney disease, stage 3a: Secondary | ICD-10-CM | POA: Diagnosis not present

## 2019-02-10 DIAGNOSIS — Z818 Family history of other mental and behavioral disorders: Secondary | ICD-10-CM

## 2019-02-10 DIAGNOSIS — M8580 Other specified disorders of bone density and structure, unspecified site: Secondary | ICD-10-CM

## 2019-02-10 DIAGNOSIS — H9192 Unspecified hearing loss, left ear: Secondary | ICD-10-CM

## 2019-02-10 DIAGNOSIS — E039 Hypothyroidism, unspecified: Secondary | ICD-10-CM

## 2019-02-10 DIAGNOSIS — F411 Generalized anxiety disorder: Secondary | ICD-10-CM | POA: Diagnosis not present

## 2019-02-10 DIAGNOSIS — Z8542 Personal history of malignant neoplasm of other parts of uterus: Secondary | ICD-10-CM

## 2019-02-10 DIAGNOSIS — R69 Illness, unspecified: Secondary | ICD-10-CM | POA: Diagnosis not present

## 2019-02-10 DIAGNOSIS — Z0000 Encounter for general adult medical examination without abnormal findings: Secondary | ICD-10-CM | POA: Diagnosis not present

## 2019-02-10 DIAGNOSIS — G4700 Insomnia, unspecified: Secondary | ICD-10-CM | POA: Diagnosis not present

## 2019-02-10 DIAGNOSIS — I10 Essential (primary) hypertension: Secondary | ICD-10-CM

## 2019-02-10 LAB — POCT URINALYSIS DIPSTICK
Appearance: NEGATIVE
Bilirubin, UA: NEGATIVE
Blood, UA: NEGATIVE
Glucose, UA: NEGATIVE
Ketones, UA: NEGATIVE
Leukocytes, UA: NEGATIVE
Nitrite, UA: NEGATIVE
Odor: NEGATIVE
Protein, UA: NEGATIVE
Spec Grav, UA: 1.01 (ref 1.010–1.025)
Urobilinogen, UA: 0.2 E.U./dL
pH, UA: 6.5 (ref 5.0–8.0)

## 2019-02-17 ENCOUNTER — Other Ambulatory Visit: Payer: Self-pay

## 2019-02-17 ENCOUNTER — Other Ambulatory Visit: Payer: Medicare HMO | Admitting: Internal Medicine

## 2019-02-17 DIAGNOSIS — R7989 Other specified abnormal findings of blood chemistry: Secondary | ICD-10-CM | POA: Diagnosis not present

## 2019-02-18 LAB — BASIC METABOLIC PANEL
BUN/Creatinine Ratio: 11 (calc) (ref 6–22)
BUN: 13 mg/dL (ref 7–25)
CO2: 30 mmol/L (ref 20–32)
Calcium: 10.1 mg/dL (ref 8.6–10.4)
Chloride: 99 mmol/L (ref 98–110)
Creat: 1.21 mg/dL — ABNORMAL HIGH (ref 0.60–0.93)
Glucose, Bld: 90 mg/dL (ref 65–99)
Potassium: 4.1 mmol/L (ref 3.5–5.3)
Sodium: 138 mmol/L (ref 135–146)

## 2019-02-24 ENCOUNTER — Encounter: Payer: Self-pay | Admitting: Internal Medicine

## 2019-02-24 DIAGNOSIS — Z1231 Encounter for screening mammogram for malignant neoplasm of breast: Secondary | ICD-10-CM | POA: Diagnosis not present

## 2019-03-03 ENCOUNTER — Other Ambulatory Visit: Payer: Self-pay | Admitting: Internal Medicine

## 2019-03-06 MED ORDER — ALPRAZOLAM 0.25 MG PO TABS: 0.2500 mg | ORAL_TABLET | Freq: Three times a day (TID) | ORAL | 5 refills | Status: DC

## 2019-03-06 NOTE — Addendum Note (Signed)
Addended by: Elby Showers on: 03/06/2019 05:37 PM   Modules accepted: Level of Service
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
## 2019-03-06 NOTE — Patient Instructions (Signed)
It was a pleasure to see you today.  Continue current medications and follow-up in 6 months. 

## 2019-03-18 ENCOUNTER — Other Ambulatory Visit: Payer: Self-pay | Admitting: Internal Medicine

## 2019-03-27 ENCOUNTER — Ambulatory Visit: Payer: Medicare HMO | Attending: Internal Medicine

## 2019-03-27 DIAGNOSIS — Z23 Encounter for immunization: Secondary | ICD-10-CM

## 2019-03-27 NOTE — Progress Notes (Signed)
   Covid-19 Vaccination Clinic  Name:  Melinda Payne    MRN: [id number] DOB: 1940/11/03  03/27/2019  Ms. Shake was observed post Covid-19 immunization for 15 minutes without incidence. She was provided with Vaccine Information Sheet and instruction to access the V-Safe system.   [name] was instructed to call 911 with any severe reactions post vaccine: Marland Kitchen Difficulty breathing  . Swelling of your face and throat  . A fast heartbeat  . A bad rash all over your body  . Dizziness and weakness    Immunizations Administered    Name Date Dose VIS Date Route   Pfizer COVID-19 Vaccine 03/27/2019 12:23 PM 0.3 mL 02/14/2019 Intramuscular   Manufacturer: [place]   Lot: [id number]   [place]: [id number]

## 2019-04-15 DIAGNOSIS — R69 Illness, unspecified: Secondary | ICD-10-CM | POA: Diagnosis not present

## 2019-04-17 ENCOUNTER — Ambulatory Visit: Payer: Medicare HMO | Attending: Internal Medicine

## 2019-04-17 DIAGNOSIS — Z23 Encounter for immunization: Secondary | ICD-10-CM | POA: Insufficient documentation

## 2019-04-17 NOTE — Progress Notes (Signed)
   Covid-19 Vaccination Clinic  Name:  Melinda Payne    MRN: [id number] DOB: [birth year]  04/17/2019  [name] was observed post Covid-19 immunization for 15 minutes without incidence. She was provided with Vaccine Information Sheet and instruction to access the V-Safe system.   [name] was instructed to call 911 with any severe reactions post vaccine: Marland Kitchen Difficulty breathing  . Swelling of your face and throat  . A fast heartbeat  . A bad rash all over your body  . Dizziness and weakness    Immunizations Administered    Name Date Dose VIS Date Route   Pfizer COVID-19 Vaccine 04/17/2019  1:05 PM 0.3 mL 02/14/2019 Intramuscular   Manufacturer: [place]   Lot: [id number]   [place]: [id number]

## 2019-04-21 DIAGNOSIS — Z961 Presence of intraocular lens: Secondary | ICD-10-CM | POA: Diagnosis not present

## 2019-04-21 DIAGNOSIS — D3132 Benign neoplasm of left choroid: Secondary | ICD-10-CM | POA: Diagnosis not present

## 2019-04-21 DIAGNOSIS — H18593 Other hereditary corneal dystrophies, bilateral: Secondary | ICD-10-CM | POA: Diagnosis not present

## 2019-04-21 DIAGNOSIS — H5203 Hypermetropia, bilateral: Secondary | ICD-10-CM | POA: Diagnosis not present

## 2019-05-27 ENCOUNTER — Other Ambulatory Visit: Payer: Self-pay | Admitting: Internal Medicine

## 2019-07-09 ENCOUNTER — Other Ambulatory Visit: Payer: Self-pay | Admitting: Internal Medicine

## 2019-07-09 NOTE — Telephone Encounter (Signed)
Left message to call me back, need to schedule 6 month f/u.

## 2019-07-09 NOTE — Telephone Encounter (Signed)
Due for  6 month follow up in June. Will need labs and OV. Please call her before refilling

## 2019-07-30 ENCOUNTER — Other Ambulatory Visit: Payer: Self-pay | Admitting: Internal Medicine

## 2019-08-07 ENCOUNTER — Ambulatory Visit: Payer: Medicare HMO | Admitting: Internal Medicine

## 2019-08-07 DIAGNOSIS — S92215A Nondisplaced fracture of cuboid bone of left foot, initial encounter for closed fracture: Secondary | ICD-10-CM | POA: Diagnosis not present

## 2019-08-14 DIAGNOSIS — S92215D Nondisplaced fracture of cuboid bone of left foot, subsequent encounter for fracture with routine healing: Secondary | ICD-10-CM | POA: Diagnosis not present

## 2019-08-19 ENCOUNTER — Other Ambulatory Visit: Payer: Self-pay

## 2019-08-19 ENCOUNTER — Other Ambulatory Visit: Payer: Medicare HMO | Admitting: Internal Medicine

## 2019-08-19 DIAGNOSIS — I10 Essential (primary) hypertension: Secondary | ICD-10-CM | POA: Diagnosis not present

## 2019-08-19 DIAGNOSIS — N1831 Chronic kidney disease, stage 3a: Secondary | ICD-10-CM | POA: Diagnosis not present

## 2019-08-19 DIAGNOSIS — E7800 Pure hypercholesterolemia, unspecified: Secondary | ICD-10-CM

## 2019-08-19 DIAGNOSIS — E039 Hypothyroidism, unspecified: Secondary | ICD-10-CM | POA: Diagnosis not present

## 2019-08-21 ENCOUNTER — Other Ambulatory Visit: Payer: Self-pay | Admitting: Internal Medicine

## 2019-08-21 ENCOUNTER — Other Ambulatory Visit: Payer: Self-pay

## 2019-08-21 ENCOUNTER — Encounter: Payer: Self-pay | Admitting: Internal Medicine

## 2019-08-21 ENCOUNTER — Ambulatory Visit (INDEPENDENT_AMBULATORY_CARE_PROVIDER_SITE_OTHER): Payer: Medicare HMO | Admitting: Internal Medicine

## 2019-08-21 VITALS — BP 160/90 | HR 68 | Ht 61.0 in | Wt 153.0 lb

## 2019-08-21 DIAGNOSIS — G4700 Insomnia, unspecified: Secondary | ICD-10-CM

## 2019-08-21 DIAGNOSIS — E039 Hypothyroidism, unspecified: Secondary | ICD-10-CM | POA: Diagnosis not present

## 2019-08-21 DIAGNOSIS — R0989 Other specified symptoms and signs involving the circulatory and respiratory systems: Secondary | ICD-10-CM

## 2019-08-21 DIAGNOSIS — E7800 Pure hypercholesterolemia, unspecified: Secondary | ICD-10-CM

## 2019-08-21 DIAGNOSIS — M8580 Other specified disorders of bone density and structure, unspecified site: Secondary | ICD-10-CM

## 2019-08-21 DIAGNOSIS — I10 Essential (primary) hypertension: Secondary | ICD-10-CM

## 2019-08-21 DIAGNOSIS — N1831 Chronic kidney disease, stage 3a: Secondary | ICD-10-CM

## 2019-08-21 DIAGNOSIS — F439 Reaction to severe stress, unspecified: Secondary | ICD-10-CM

## 2019-08-21 DIAGNOSIS — S92902S Unspecified fracture of left foot, sequela: Secondary | ICD-10-CM

## 2019-08-21 DIAGNOSIS — R69 Illness, unspecified: Secondary | ICD-10-CM | POA: Diagnosis not present

## 2019-08-21 DIAGNOSIS — F411 Generalized anxiety disorder: Secondary | ICD-10-CM

## 2019-08-21 NOTE — Patient Instructions (Addendum)
TSH level and bone density  study ordered today.  Carotid duplex ordered as well due to right carotid bruit.  Follow-up in 6 months for Medicare wellness and health maintenance exam with fasting labs. BP check in next 2 weeks here.

## 2019-08-21 NOTE — Progress Notes (Addendum)
Subjective:    Patient ID: Melinda Payne, female    DOB: 06/19/1940, 79 y.o.   MRN: [id number]  HPI 79 year old Female for 6 month follow up. Had Covid-19 vaccines without issue. Hx HTN treated with HCTZ.  History of anxiety and insomnia treated with Xanax.  History of mild memory loss.  Strong family history of Alzheimer's disease.  History of hypothyroidism treated with levothyroxine.  Takes meloxicam for musculoskeletal pain.  Is on Zocor 10 mg daily for hyperlipidemia.  She appears today in a left walking boot.  Says she has been seen by Dr. [facility] on [facility] near her home and been diagnosed with a fractured bone in her foot.  Says she needs to wear the boot for some 3 weeks.  Does not recall any specific accident to cause this.  Bone density study ordered at [facility] today via this office.  Last one was 2 years ago.  Lowest T score at the time was -1.6 in the right femoral neck.  Still has situational stress with family members.  She feels her memory issues are stable at the present time.  She does not want to see neurologist or neuropsychologist for testing.   Sister with memory loss has had stroke in April  Review of Systems     Objective:   Physical Exam Blood pressure is elevated today at 160/90.  She is anxious in describing situational stress with her family members.  Skin warm and dry.  No cervical adenopathy.  No thyromegaly.  She has a right carotid bruit which is new.  Neck is supple.  Chest clear to auscultation.  Cardiac exam regular rate and rhythm normal S1 and S2 without significant murmurs or gallops.  No lower extremity pitting edema.  Affect is cooperative.  No stay awake year and president of [place].       Assessment & Plan:  Family history of Alzheimer's disease.  Patient has history of mild memory loss and will continue with Aricept 5 mg daily.  She does not want to increase dose. Does not want neuropsychological testing. Use Xanax  sparingly during the day. Continue same dose 1 mg at bedtime.  Fractured bone in left foot followed by Dr. [facility].  Bone density study ordered.    History of osteopenia- bone density study last done 2019 and was re-ordered today due to osteopenia and foot fracture  History of insomnia continue Xanax 1 mg at bedtime. I do not think this is contributing to memory loss. Memory issues run in family. Anxiety is a component of her insomnia.  History of anxiety due to situational stress- may take 0.25 mg up to 3 times a day as needed for situational stress with family.  Hypothyroidism-TSH added to labs.  We forgot to order this initially when her labs were drawn  Hyperlipidemia-stable on statin medication.  Total cholesterol 172, LDL cholesterol 80.  HDL cholesterol is 77.  Elevated BP reading- likely due to situational stress. RTC in next 2 weeks for recheck. Currently on HCTZ daily and no other BP med.  Hx of chronic kidney disease Stage III a. Followed at [facility]. Results faxed to them.Due for follow up visit. Last sen there April 2020.  Plan: To have right carotid duplex study because of bruit. Bone density study ordered. RTC in next 2 weeks for BP reading.  Plan next Follow-up visit in 6 months.  It will be time for her Medicare wellness and health  maintenance exam at that time.

## 2019-08-22 LAB — LIPID PANEL
Cholesterol: 172 mg/dL (ref <200)
HDL: 77 mg/dL (ref >=50)
LDL Cholesterol (Calc): 80 mg/dL (calc)
Non-HDL Cholesterol (Calc): 95 mg/dL (calc) (ref <130)
Total CHOL/HDL Ratio: 2.2 (calc) (ref <5.0)
Triglycerides: 69 mg/dL (ref <150)

## 2019-08-22 LAB — BASIC METABOLIC PANEL
BUN/Creatinine Ratio: 19 (calc) (ref 6–22)
BUN: 23 mg/dL (ref 7–25)
CO2: 21 mmol/L (ref 20–32)
Calcium: 9.5 mg/dL (ref 8.6–10.4)
Chloride: 105 mmol/L (ref 98–110)
Creat: 1.23 mg/dL — ABNORMAL HIGH (ref 0.60–0.93)
Glucose, Bld: 97 mg/dL (ref 65–99)
Potassium: 4.9 mmol/L (ref 3.5–5.3)
Sodium: 145 mmol/L (ref 135–146)

## 2019-08-22 LAB — HEPATIC FUNCTION PANEL
AG Ratio: 1.8 (calc) (ref 1.0–2.5)
ALT: 11 U/L (ref 6–29)
AST: 16 U/L (ref 10–35)
Albumin: 4.1 g/dL (ref 3.6–5.1)
Alkaline phosphatase (APISO): 64 U/L (ref 37–153)
Bilirubin, Direct: 0.2 mg/dL (ref 0.0–0.2)
Globulin: 2.3 g/dL (calc) (ref 1.9–3.7)
Indirect Bilirubin: 0.6 mg/dL (calc) (ref 0.2–1.2)
Total Bilirubin: 0.8 mg/dL (ref 0.2–1.2)
Total Protein: 6.4 g/dL (ref 6.1–8.1)

## 2019-08-22 LAB — TEST AUTHORIZATION

## 2019-08-22 LAB — TEST AUTHORIZATION 2

## 2019-08-22 LAB — TSH: TSH: 1.69 mIU/L (ref 0.40–4.50)

## 2019-08-26 ENCOUNTER — Ambulatory Visit (INDEPENDENT_AMBULATORY_CARE_PROVIDER_SITE_OTHER): Payer: Medicare HMO | Admitting: Internal Medicine

## 2019-08-26 ENCOUNTER — Other Ambulatory Visit: Payer: Self-pay

## 2019-08-26 ENCOUNTER — Encounter: Payer: Self-pay | Admitting: Internal Medicine

## 2019-08-26 ENCOUNTER — Telehealth: Payer: Self-pay | Admitting: Internal Medicine

## 2019-08-26 ENCOUNTER — Ambulatory Visit (HOSPITAL_COMMUNITY)
Admission: RE | Admit: 2019-08-26 | Discharge: 2019-08-26 | Disposition: A | Discharge status: Home / Self Care | Payer: Medicare HMO | Source: Ambulatory Visit | Attending: Internal Medicine | Admitting: Internal Medicine

## 2019-08-26 VITALS — BP 110/70 | HR 60 | Ht 61.0 in | Wt 153.0 lb

## 2019-08-26 DIAGNOSIS — R0989 Other specified symptoms and signs involving the circulatory and respiratory systems: Secondary | ICD-10-CM | POA: Diagnosis not present

## 2019-08-26 DIAGNOSIS — I10 Essential (primary) hypertension: Secondary | ICD-10-CM

## 2019-08-26 NOTE — Telephone Encounter (Signed)
My Staff called Vascular Surgery office regarding appt. Patient is asymptomatic. Is on ASA 81 mg daily. They indicated they would call patient within the next 2 weeks since she is asymptomatic. I called patient back to relay this message. [email or web address]

## 2019-08-26 NOTE — Progress Notes (Signed)
Carotid artery duplex has been completed. Preliminary results can be found in CV Proc through chart review.   08/26/19 1:00 PM Melinda Payne RVT

## 2019-08-26 NOTE — Progress Notes (Signed)
   Subjective:    Patient ID: Melinda Payne, female    DOB: 1940-09-28, 79 y.o.   MRN: [id number]  HPI Patient here today for repeat blood pressure check.  When she was here on June 17 blood pressure was 160/90 but she had just taken her medication prior to coming here.  We asked that she return for follow-up blood pressure.    Review of Systems no new complaints     Objective:   Physical Exam  Blood pressure today is excellent at 110/70.  This was taken by CMA.  Not seen by physician.      Assessment & Plan:  Essential hypertension-blood pressure reading today is within normal limits  Plan: She should continue to monitor her blood pressure at home.  CPE scheduled for December 2021.

## 2019-08-26 NOTE — Patient Instructions (Signed)
Blood pressure today is normal.  Continue to monitor blood pressure at home and call if persistently elevated.  Continue current medications.

## 2019-08-26 NOTE — Telephone Encounter (Signed)
Has 80-99% asymptomatic blockage on carotid duplex. Is on ASA 81 mg daily. Patient contacted with results and need for vascular Surgery Consultation. [email or web address]

## 2019-08-27 ENCOUNTER — Other Ambulatory Visit: Payer: Self-pay

## 2019-08-27 DIAGNOSIS — R0989 Other specified symptoms and signs involving the circulatory and respiratory systems: Secondary | ICD-10-CM

## 2019-08-27 DIAGNOSIS — I6522 Occlusion and stenosis of left carotid artery: Secondary | ICD-10-CM

## 2019-08-27 DIAGNOSIS — I129 Hypertensive chronic kidney disease with stage 1 through stage 4 chronic kidney disease, or unspecified chronic kidney disease: Secondary | ICD-10-CM | POA: Diagnosis not present

## 2019-08-27 DIAGNOSIS — N2581 Secondary hyperparathyroidism of renal origin: Secondary | ICD-10-CM | POA: Diagnosis not present

## 2019-08-27 DIAGNOSIS — D631 Anemia in chronic kidney disease: Secondary | ICD-10-CM | POA: Diagnosis not present

## 2019-08-27 DIAGNOSIS — N183 Chronic kidney disease, stage 3 unspecified: Secondary | ICD-10-CM | POA: Diagnosis not present

## 2019-08-28 DIAGNOSIS — S92215D Nondisplaced fracture of cuboid bone of left foot, subsequent encounter for fracture with routine healing: Secondary | ICD-10-CM | POA: Diagnosis not present

## 2019-09-03 ENCOUNTER — Encounter: Payer: Self-pay | Admitting: Internal Medicine

## 2019-09-03 DIAGNOSIS — M85851 Other specified disorders of bone density and structure, right thigh: Secondary | ICD-10-CM | POA: Diagnosis not present

## 2019-09-09 DIAGNOSIS — S92215D Nondisplaced fracture of cuboid bone of left foot, subsequent encounter for fracture with routine healing: Secondary | ICD-10-CM | POA: Diagnosis not present

## 2019-09-11 ENCOUNTER — Other Ambulatory Visit: Payer: Self-pay | Admitting: *Deleted

## 2019-09-11 DIAGNOSIS — I6529 Occlusion and stenosis of unspecified carotid artery: Secondary | ICD-10-CM

## 2019-09-11 DIAGNOSIS — R0989 Other specified symptoms and signs involving the circulatory and respiratory systems: Secondary | ICD-10-CM

## 2019-09-15 ENCOUNTER — Other Ambulatory Visit: Payer: Self-pay | Admitting: Internal Medicine

## 2019-09-16 DIAGNOSIS — D1801 Hemangioma of skin and subcutaneous tissue: Secondary | ICD-10-CM | POA: Diagnosis not present

## 2019-09-16 DIAGNOSIS — D2361 Other benign neoplasm of skin of right upper limb, including shoulder: Secondary | ICD-10-CM | POA: Diagnosis not present

## 2019-09-16 DIAGNOSIS — L821 Other seborrheic keratosis: Secondary | ICD-10-CM | POA: Diagnosis not present

## 2019-09-16 DIAGNOSIS — Z85820 Personal history of malignant melanoma of skin: Secondary | ICD-10-CM | POA: Diagnosis not present

## 2019-09-16 DIAGNOSIS — D225 Melanocytic nevi of trunk: Secondary | ICD-10-CM | POA: Diagnosis not present

## 2019-09-16 DIAGNOSIS — L820 Inflamed seborrheic keratosis: Secondary | ICD-10-CM | POA: Diagnosis not present

## 2019-09-16 DIAGNOSIS — L570 Actinic keratosis: Secondary | ICD-10-CM | POA: Diagnosis not present

## 2019-09-18 ENCOUNTER — Other Ambulatory Visit: Payer: Self-pay

## 2019-09-18 ENCOUNTER — Ambulatory Visit (HOSPITAL_COMMUNITY)
Admission: RE | Admit: 2019-09-18 | Discharge: 2019-09-18 | Disposition: A | Discharge status: Home / Self Care | Payer: Medicare HMO | Source: Ambulatory Visit | Attending: Vascular Surgery | Admitting: Vascular Surgery

## 2019-09-18 DIAGNOSIS — I6529 Occlusion and stenosis of unspecified carotid artery: Secondary | ICD-10-CM | POA: Diagnosis not present

## 2019-09-22 ENCOUNTER — Other Ambulatory Visit: Payer: Self-pay | Admitting: Internal Medicine

## 2019-09-23 ENCOUNTER — Ambulatory Visit: Payer: Medicare HMO | Admitting: Vascular Surgery

## 2019-09-23 ENCOUNTER — Other Ambulatory Visit: Payer: Self-pay

## 2019-09-23 ENCOUNTER — Encounter: Payer: Self-pay | Admitting: Vascular Surgery

## 2019-09-23 VITALS — BP 156/66 | HR 63 | Temp 97.1°F | Ht 61.0 in | Wt 154.0 lb

## 2019-09-23 DIAGNOSIS — I6529 Occlusion and stenosis of unspecified carotid artery: Secondary | ICD-10-CM

## 2019-09-23 NOTE — Progress Notes (Signed)
[facility]ist [facility] [place]  Patient name: Melinda Payne MRN: [id number] DOB: November 18, 1940 Sex: female  REASON FOR CONSULT: Evaluation right carotid stenosis  HPI: Melinda Payne is a 79 y.o. female, who is here today for evaluation.  She is found to have a right carotid bruit and underwent duplex showing critical stenosis in her right internal carotid artery with no significant stenosis in the left carotid system.  She is right-handed.  She specifically denies any prior episodes of amaurosis fugax, expressive aphasia or transient ischemic attack or stroke.  She had does not have any history of cardiac disease.  She really has not minimal risk factors for peripheral vascular disease.  She is 1 of 16 children and does have atherosclerotic disease and some family members but most of these were cigarette smokers.  She has never smoked.  Past Medical History:  Diagnosis Date  . Cancer ([place]) 1997   melanoma rt neck  . Counseling for estrogen replacement therapy   . Fibrocystic breast disease   . Hypertension   . Insomnia   . Spinal stenosis   . Vitamin D deficiency     Family History  Problem Relation Age of Onset  . Alcohol abuse Father   . Alzheimer's disease Brother   . Dementia Brother   . Alzheimer's disease Sister   . Dementia Sister     SOCIAL HISTORY: Social History   Socioeconomic History  . Marital status: Married    Spouse name: Not on file  . Number of children: Not on file  . Years of education: Not on file  . Highest education level: Not on file  Occupational History  . Not on file  Tobacco Use  . Smoking status: Never Smoker  . Smokeless tobacco: Never Used  Substance and Sexual Activity  . Alcohol use: No  . Drug use: No  . Sexual activity: Not on file  Other Topics Concern  . Not on file  Social History Narrative  . Not on file   Social Determinants of Health   Financial Resource Strain:   .  Difficulty of Paying Living Expenses:   Food Insecurity:   . Worried About Charity fundraiser in the Last Year:   . Arboriculturist in the Last Year:   Transportation Needs:   . Film/video editor (Medical):   Marland Kitchen Lack of Transportation (Non-Medical):   Physical Activity:   . Days of Exercise per Week:   . Minutes of Exercise per Session:   Stress:   . Feeling of Stress :   Social Connections:   . Frequency of Communication with Friends and Family:   . Frequency of Social Gatherings with Friends and Family:   . Attends Religious Services:   . Active Member of Clubs or Organizations:   . Attends Archivist Meetings:   Marland Kitchen Marital Status:   Intimate Partner Violence:   . Fear of Current or Ex-Partner:   . Emotionally Abused:   Marland Kitchen Physically Abused:   . Sexually Abused:     Allergies  Allergen Reactions  . Penicillins Hives  . Codeine Hives    Current Outpatient Medications  Medication Sig Dispense Refill  . ALPRAZolam (XANAX) 0.25 MG tablet TAKE 1 TABLET BY MOUTH THREE TIMES A DAY 90 tablet 2  . ALPRAZolam (XANAX) 1 MG tablet TAKE ONE TABLET BY MOUTH EVERY NIGHT AT BEDTIME AS NEEDED FOR INSOMNIA 90 tablet 1  . aspirin 81 MG tablet  Take 81 mg by mouth daily.      . Cholecalciferol (VITAMIN D-3) 1000 units CAPS Take by mouth.    . donepezil (ARICEPT) 5 MG tablet TAKE ONE TABLET BY MOUTH AT BEDTIME 90 tablet 2  . hydrochlorothiazide (HYDRODIURIL) 25 MG tablet TAKE ONE TABLET BY MOUTH DAILY 90 tablet 1  . levothyroxine (SYNTHROID) 75 MCG tablet TAKE ONE TABLET BY MOUTH DAILY 90 tablet 0  . meloxicam (MOBIC) 7.5 MG tablet Take 7.5 mg by mouth daily.    . Multiple Vitamins-Minerals (MULTIVITAMIN WITH MINERALS) tablet Take 1 tablet by mouth daily.      Marland Kitchen omega-3 acid ethyl esters (LOVAZA) 1 G capsule Take 2 g by mouth daily.     . simvastatin (ZOCOR) 10 MG tablet TAKE ONE TABLET BY MOUTH DAILY AT 6PM 90 tablet 2  . vitamin B-12 (CYANOCOBALAMIN) 100 MCG tablet Take 100  mcg by mouth daily.     . vitamin C (ASCORBIC ACID) 500 MG tablet Take 500 mg by mouth daily.     No current facility-administered medications for this visit.    REVIEW OF SYSTEMS:  [X]  denotes positive finding, [ ]  denotes negative finding Cardiac  Comments:  Chest pain or chest pressure:    Shortness of breath upon exertion:    Short of breath when lying flat:    Irregular heart rhythm:        Vascular    Pain in calf, thigh, or hip brought on by ambulation:    Pain in feet at night that wakes you up from your sleep:     Blood clot in your veins:    Leg swelling:         Pulmonary    Oxygen at home:    Productive cough:     Wheezing:         Neurologic    Sudden weakness in arms or legs:     Sudden numbness in arms or legs:     Sudden onset of difficulty speaking or slurred speech:    Temporary loss of vision in one eye:     Problems with dizziness:         Gastrointestinal    Blood in stool:     Vomited blood:         Genitourinary    Burning when urinating:     Blood in urine:        Psychiatric    Major depression:         Hematologic    Bleeding problems:    Problems with blood clotting too easily:        Skin    Rashes or ulcers:        Constitutional    Fever or chills:      PHYSICAL EXAM: Vitals:   09/23/19 1243 09/23/19 1258  BP: (!) 156/66   Pulse: 63   Temp:  (!) 97.1 F (36.2 C)  TempSrc:  Temporal  SpO2: 97%   Weight: 154 lb (69.9 kg)   Height: 5\' 1"  (1.549 m)     GENERAL: The patient is a well-nourished female, in no acute distress. The vital signs are documented above. CARDIOVASCULAR: Right carotid bruit no bruit on the left.  2+ radial and 2+ dorsalis pedis pulses bilaterally PULMONARY: There is good air exchange  ABDOMEN: Soft and non-tender  MUSCULOSKELETAL: There are no major deformities or cyanosis. NEUROLOGIC: No focal weakness or paresthesias are detected. SKIN: There are no ulcers or rashes noted.  PSYCHIATRIC: The  patient has a normal affect.  DATA:  Carotid duplex was revealed showing a critical stenosis on the right.  We repeated the right carotid duplex to determine if she was a candidate for surgery based on duplex alone.  She does have normal internal carotid distal to the stenosis at the bifurcation  MEDICAL ISSUES: Critical asymptomatic stenosis right internal carotid artery.  I discussed the significance of this with the patient and recommended endarterectomy for reduction of stroke risk.  I explained the procedure in detail including expected 1 night hospitalization.  Also explained the 1 to 2% risk of stroke with surgery.  She understands and wished to proceed.  She does have a upcoming vacation in [place] September and I have suggested that she wait until after the vacation to schedule surgery.  Explained that her risk over 1 to 2 months would certainly be comfortable to her risk for surgery.   Rosetta Posner, MD FACS [facility]in Specialists of [facility] Tel [phone] Pager [phone]

## 2019-10-04 ENCOUNTER — Other Ambulatory Visit: Payer: Self-pay | Admitting: Internal Medicine

## 2019-10-09 DIAGNOSIS — S92215D Nondisplaced fracture of cuboid bone of left foot, subsequent encounter for fracture with routine healing: Secondary | ICD-10-CM | POA: Diagnosis not present

## 2019-10-21 ENCOUNTER — Telehealth: Payer: Self-pay | Admitting: Internal Medicine

## 2019-10-21 NOTE — Telephone Encounter (Signed)
It may go up with anxiety and stress. It is not a problem unless it is staying up consistently for 24 hours or more

## 2019-10-21 NOTE — Telephone Encounter (Signed)
Melinda Payne [phone]  Melinda Payne called to say she took her blood pressure and it was 153/108 and she took it a couple of minutes later and it was 153/77 she is feeling anxious with everything she has going on. She said it could be her machine she is going to take it again is just a little bit.

## 2019-10-21 NOTE — Telephone Encounter (Signed)
Called patient to let her know what Dr Baxley said, she verbalized understanding. 

## 2019-11-21 ENCOUNTER — Other Ambulatory Visit: Payer: Self-pay

## 2019-11-25 NOTE — Pre-Procedure Instructions (Addendum)
Melinda Payne  11/25/2019      [id number] [place], [place] [address] [place] [address] Phone: [phone] Fax: [place] [address] [place] [address] Phone: [phone] Fax: [phone]  [place], [place], [address] [place], [place] [address] [place] [address] Phone: [phone] Fax: [phone]    Your procedure is scheduled on Sept. 24  Report to [facility] Entrance A at 10:00 A.M.  Call this number if you have problems the morning of surgery:  [phone]   Remember:  Do not eat or drink after midnight.      Take these medicines the morning of surgery with A SIP OF WATER :              Alprazolam (xanax)             Aspirin             Levothyroxine (synthroid)             Simvastatin (zocor)              7 days prior to surgery STOP taking  Aleve, Naproxen, Ibuprofen, Motrin, Advil, Goody's, BC's, all herbal medications, fish oil, and all vitamins.    Do not wear jewelry, make-up or nail polish.  Do not wear lotions, powders, or perfumes, or deodorant.  Do not shave 48 hours prior to surgery.  Men may shave face and neck.  Do not bring valuables to the hospital.  [facility] is not responsible for any belongings or valuables.  Contacts, dentures or bridgework may not be worn into surgery.  Leave your suitcase in the car.  After surgery it may be brought to your room.  For patients admitted to the hospital, discharge time will be determined by your treatment team.  Patients discharged the day of surgery will not be allowed to drive home.    Special instructions:   [name]  Before surgery, you can play an important role. Because skin is not sterile, your skin needs to be as free of germs as possible. You can reduce the number of  germs on your skin by washing with CHG (chlorahexidine gluconate) Soap before surgery.  CHG is an antiseptic cleaner which kills germs and bonds with the skin to continue killing germs even after washing.    Oral Hygiene is also important to reduce your risk of infection.  Remember - BRUSH YOUR TEETH THE MORNING OF SURGERY WITH YOUR REGULAR TOOTHPASTE  Please do not use if you have an allergy to CHG or antibacterial soaps. If your skin becomes reddened/irritated stop using the CHG.  Do not shave (including legs and underarms) for at least 48 hours prior to first CHG shower. It is OK to shave your face.  Please follow these instructions carefully.   1. Shower the NIGHT BEFORE SURGERY and the MORNING OF SURGERY with CHG.   2. If you chose to wash your hair, wash your hair first as usual with your normal shampoo.  3. After you shampoo, rinse your hair and body thoroughly to remove the shampoo.  4. Use CHG as you would any other liquid soap. You can apply CHG directly to the skin and wash gently with a scrungie or a clean washcloth.   5. Apply the CHG Soap to  your body ONLY FROM THE NECK DOWN.  Do not use on open wounds or open sores. Avoid contact with your eyes, ears, mouth and genitals (private parts). Wash Face and genitals (private parts)  with your normal soap.  6. Wash thoroughly, paying special attention to the area where your surgery will be performed.  7. Thoroughly rinse your body with warm water from the neck down.  8. DO NOT shower/wash with your normal soap after using and rinsing off the CHG Soap.  9. Pat yourself dry with a CLEAN TOWEL.  10. Wear CLEAN PAJAMAS to bed the night before surgery, wear comfortable clothes the morning of surgery  11. Place CLEAN SHEETS on your bed the night of your first shower and DO NOT SLEEP WITH PETS.    Day of Surgery:  Do not apply any deodorants/lotions.  Please wear clean clothes to the hospital/surgery center.   Remember to brush  your teeth WITH YOUR REGULAR TOOTHPASTE.    Please read over the following fact sheets that you were given.

## 2019-11-26 ENCOUNTER — Encounter (HOSPITAL_COMMUNITY): Payer: Self-pay

## 2019-11-26 ENCOUNTER — Other Ambulatory Visit: Payer: Self-pay

## 2019-11-26 ENCOUNTER — Encounter (HOSPITAL_COMMUNITY)
Admission: RE | Admit: 2019-11-26 | Discharge: 2019-11-26 | Disposition: A | Discharge status: Home / Self Care | Payer: Medicare HMO | Source: Ambulatory Visit | Attending: Vascular Surgery | Admitting: Vascular Surgery

## 2019-11-26 ENCOUNTER — Other Ambulatory Visit (HOSPITAL_COMMUNITY)
Admission: RE | Admit: 2019-11-26 | Discharge: 2019-11-26 | Disposition: A | Discharge status: Home / Self Care | Payer: Medicare HMO | Source: Ambulatory Visit | Attending: Vascular Surgery | Admitting: Vascular Surgery

## 2019-11-26 DIAGNOSIS — Z01812 Encounter for preprocedural laboratory examination: Secondary | ICD-10-CM | POA: Insufficient documentation

## 2019-11-26 DIAGNOSIS — Z79899 Other long term (current) drug therapy: Secondary | ICD-10-CM | POA: Diagnosis not present

## 2019-11-26 DIAGNOSIS — Z791 Long term (current) use of non-steroidal anti-inflammatories (NSAID): Secondary | ICD-10-CM | POA: Diagnosis not present

## 2019-11-26 DIAGNOSIS — Z01818 Encounter for other preprocedural examination: Secondary | ICD-10-CM | POA: Insufficient documentation

## 2019-11-26 DIAGNOSIS — Z7982 Long term (current) use of aspirin: Secondary | ICD-10-CM | POA: Diagnosis not present

## 2019-11-26 DIAGNOSIS — Z85820 Personal history of malignant melanoma of skin: Secondary | ICD-10-CM | POA: Diagnosis not present

## 2019-11-26 DIAGNOSIS — E039 Hypothyroidism, unspecified: Secondary | ICD-10-CM | POA: Diagnosis not present

## 2019-11-26 DIAGNOSIS — Z79890 Hormone replacement therapy (postmenopausal): Secondary | ICD-10-CM | POA: Diagnosis not present

## 2019-11-26 DIAGNOSIS — Z20822 Contact with and (suspected) exposure to covid-19: Secondary | ICD-10-CM | POA: Insufficient documentation

## 2019-11-26 DIAGNOSIS — I10 Essential (primary) hypertension: Secondary | ICD-10-CM | POA: Diagnosis not present

## 2019-11-26 DIAGNOSIS — I6521 Occlusion and stenosis of right carotid artery: Secondary | ICD-10-CM | POA: Diagnosis not present

## 2019-11-26 HISTORY — DX: Hypothyroidism, unspecified: E03.9

## 2019-11-26 HISTORY — DX: Occlusion and stenosis of unspecified carotid artery: I65.29

## 2019-11-26 HISTORY — DX: Chronic kidney disease, unspecified: N18.9

## 2019-11-26 HISTORY — DX: Unspecified osteoarthritis, unspecified site: M19.90

## 2019-11-26 HISTORY — DX: Anxiety disorder, unspecified: F41.9

## 2019-11-26 LAB — URINALYSIS, ROUTINE W REFLEX MICROSCOPIC
Bacteria, UA: NONE SEEN
Bilirubin Urine: NEGATIVE
Glucose, UA: NEGATIVE mg/dL
Hgb urine dipstick: NEGATIVE
Ketones, ur: NEGATIVE mg/dL
Nitrite: NEGATIVE
Protein, ur: NEGATIVE mg/dL
Specific Gravity, Urine: 1.014 (ref 1.005–1.030)
pH: 6 (ref 5.0–8.0)

## 2019-11-26 LAB — TYPE AND SCREEN
ABO/RH(D): A NEG
Antibody Screen: NEGATIVE

## 2019-11-26 LAB — CBC
HCT: 40.5 % (ref 36.0–46.0)
Hemoglobin: 13.4 g/dL (ref 12.0–15.0)
MCH: 32.1 pg (ref 26.0–34.0)
MCHC: 33.1 g/dL (ref 30.0–36.0)
MCV: 96.9 fL (ref 80.0–100.0)
Platelets: 173 10*3/uL (ref 150–400)
RBC: 4.18 MIL/uL (ref 3.87–5.11)
RDW: 12 % (ref 11.5–15.5)
WBC: 6.2 10*3/uL (ref 4.0–10.5)
nRBC: 0 % (ref 0.0–0.2)

## 2019-11-26 LAB — COMPREHENSIVE METABOLIC PANEL
ALT: 16 U/L (ref 0–44)
AST: 23 U/L (ref 15–41)
Albumin: 4.3 g/dL (ref 3.5–5.0)
Alkaline Phosphatase: 58 U/L (ref 38–126)
Anion gap: 11 (ref 5–15)
BUN: 16 mg/dL (ref 8–23)
CO2: 26 mmol/L (ref 22–32)
Calcium: 9.7 mg/dL (ref 8.9–10.3)
Chloride: 99 mmol/L (ref 98–111)
Creatinine, Ser: 1.24 mg/dL — ABNORMAL HIGH (ref 0.44–1.00)
GFR calc Af Amer: 49 mL/min — ABNORMAL LOW (ref >60)
GFR calc non Af Amer: 42 mL/min — ABNORMAL LOW (ref >60)
Glucose, Bld: 96 mg/dL (ref 70–99)
Potassium: 3.4 mmol/L — ABNORMAL LOW (ref 3.5–5.1)
Sodium: 136 mmol/L (ref 135–145)
Total Bilirubin: 1.1 mg/dL (ref 0.3–1.2)
Total Protein: 7.2 g/dL (ref 6.5–8.1)

## 2019-11-26 LAB — PROTIME-INR
INR: 1 (ref 0.8–1.2)
Prothrombin Time: 12.8 seconds (ref 11.4–15.2)

## 2019-11-26 LAB — SURGICAL PCR SCREEN
MRSA, PCR: NEGATIVE
Staphylococcus aureus: NEGATIVE

## 2019-11-26 LAB — APTT: aPTT: 27 seconds (ref 24–36)

## 2019-11-26 LAB — SARS CORONAVIRUS 2 (TAT 6-24 HRS): SARS Coronavirus 2: NEGATIVE

## 2019-11-26 NOTE — Progress Notes (Signed)
PCP - Cresenciano Lick baxley Cardiologist - denies Nephrologist: Pate  Chest x-ray -  na EKG -  11/26/19 Stress Test - na ECHO - na Cardiac Cath - na  Sleep Study - na   Fasting Blood Sugar - na   Blood Thinner Instructions: na Aspirin Instructions: continue    COVID TEST- 11/26/19   Anesthesia review:   Patient denies shortness of breath, fever, cough and chest pain at PAT appointment   All instructions explained to the patient, with a verbal understanding of the material. Patient agrees to go over the instructions while at home for a better understanding. Patient also instructed to self quarantine after being tested for COVID-19. The opportunity to ask questions was provided.

## 2019-11-27 ENCOUNTER — Encounter (HOSPITAL_COMMUNITY): Payer: Self-pay

## 2019-11-27 NOTE — Anesthesia Preprocedure Evaluation (Addendum)
Anesthesia Evaluation  Patient identified by MRN, date of birth, ID band Patient awake    Reviewed: Allergy & Precautions, NPO status , Patient's Chart, lab work & pertinent test results  Airway Mallampati: III  TM Distance: >3 FB Neck ROM: Full    Dental no notable dental hx. (+) Teeth Intact, Dental Advisory Given   Pulmonary neg pulmonary ROS,    Pulmonary exam normal breath sounds clear to auscultation       Cardiovascular hypertension, Pt. on medications Normal cardiovascular exam Rhythm:Regular Rate:Normal  ECG: rate 68. Normal sinus rhythm Right bundle branch block   Neuro/Psych Anxiety negative neurological ROS     GI/Hepatic negative GI ROS, Neg liver ROS,   Endo/Other  Hypothyroidism   Renal/GU negative Renal ROS     Musculoskeletal  (+) Arthritis ,   Abdominal   Peds  Hematology HLD   Anesthesia Other Findings RIGHT CAROTID STENOSIS  Reproductive/Obstetrics                          Anesthesia Physical Anesthesia Plan  ASA: III  Anesthesia Plan: General   Post-op Pain Management:    Induction: Intravenous  PONV Risk Score and Plan: 3 and Ondansetron, Dexamethasone, Propofol infusion and Treatment may vary due to age or medical condition  Airway Management Planned: Oral ETT  Additional Equipment: Arterial line  Intra-op Plan:   Post-operative Plan: Extubation in OR  Informed Consent: I have reviewed the patients History and Physical, chart, labs and discussed the procedure including the risks, benefits and alternatives for the proposed anesthesia with the patient or authorized representative who has indicated his/her understanding and acceptance.     Dental advisory given  Plan Discussed with: CRNA  Anesthesia Plan Comments: (Reviewed PAT note written 11/27/2019 by Myra Gianotti, PA-C. )      Anesthesia Quick Evaluation

## 2019-11-27 NOTE — Progress Notes (Signed)
Anesthesia Chart Review:  Case: [id number] Date/Time: 11/28/19 1153   Procedure: RIGHT CAROTID ENDARTERECTOMY (Right )   Anesthesia type: General   Pre-op diagnosis: RIGHT CAROTID STENOSIS   Location: [facility] OR ROOM 12 / [place]   Surgeons: Rosetta Posner, MD      DISCUSSION: Patient is a 79 year old female scheduled for the above procedure. In June she was noted to have a right carotid bruits with US showing 80-99% RICA stenosis.   History includes never smoker, carotid artery stenosis, CKD (stage IIIa), HTN, hypothyroidism, anxiety, right neck melanoma (s/p excision 1997), fibrocystic breast disease.  She is to continue ASA perioperatively.  She has right BBB on EKG. She denied shortness of breath, cough, fever at PAT RN visit.  2nd [place] COVID-19 vaccine 04/17/19. 11/26/2019 presurgical COVID-19 test negative.  Anesthesia team to evaluate on the day of surgery.   VS: BP (!) 163/63   Pulse 66   Temp 36.6 C (Oral)   Resp 17   Ht 5\' 1"  (1.549 m)   Wt 69.1 kg   SpO2 100%   BMI 28.80 kg/m    PROVIDERS: Elby Showers, MD is PCP  Elmarie Shiley, MD is nephrologist. 08/27/19 office noted scanned under Media tab. One year follow-up recommended.   LABS: Labs reviewed: Acceptable for surgery. (all labs ordered are listed, but only abnormal results are displayed)  Labs Reviewed  COMPREHENSIVE METABOLIC PANEL - Abnormal; Notable for the following components:      Result Value   Potassium 3.4 (*)    Creatinine, Ser 1.24 (*)    GFR calc non Af Amer 42 (*)    GFR calc Af Amer 49 (*)    All other components within normal limits  URINALYSIS, ROUTINE W REFLEX MICROSCOPIC - Abnormal; Notable for the following components:   Leukocytes,Ua TRACE (*)    All other components within normal limits  SURGICAL PCR SCREEN  APTT  CBC  PROTIME-INR  TYPE AND SCREEN    EKG: 11/26/19:  Normal sinus rhythm Right bundle branch block Abnormal ECG No old tracing to compare Confirmed by [place] [phone])  on 11/26/2019 11:00:51 PM   CV: Carotid US 09/18/19: Summary:  - Right Carotid: Velocities in the right ICA are consistent with a 80-99% stenosis.  - Left Carotid: Velocities in the left ICA are consistent with a 1-39% stenosis.  - Vertebrals: Bilateral vertebral arteries demonstrate antegrade flow. Right vertebral artery waveform is atypical.  - Subclavians: Normal flow hemodynamics were seen in the left subclavian  artery. Monophasic flow in the right subclavian artery.    Past Medical History:  Diagnosis Date  . Anxiety   . Arthritis    back  . Cancer ([place]) 1997   melanoma rt neck  . Carotid artery stenosis   . Chronic kidney disease    stage renal disease  . Counseling for estrogen replacement therapy   . Fibrocystic breast disease   . Hypertension   . Hypothyroidism   . Insomnia   . Spinal stenosis   . Vitamin D deficiency     Past Surgical History:  Procedure Laterality Date  . ABDOMINAL HYSTERECTOMY  03/1997   bso endometrial cancer  . MELANOMA EXCISION     neck  . TUBAL LIGATION      MEDICATIONS: . ALPRAZolam (XANAX) 0.25 MG tablet  . ALPRAZolam (XANAX) 1 MG tablet  . ascorbic acid (VITAMIN C) 500 MG tablet  . aspirin EC 81 MG tablet  . Cholecalciferol (VITAMIN D-3) 1000  units CAPS  . donepezil (ARICEPT) 5 MG tablet  . hydrochlorothiazide (HYDRODIURIL) 25 MG tablet  . levothyroxine (SYNTHROID) 75 MCG tablet  . Multiple Vitamins-Minerals (MULTIVITAMIN WITH MINERALS) tablet  . Omega-3 Fatty Acids (OMEGA 3 PO)  . simvastatin (ZOCOR) 10 MG tablet  . vitamin B-12 (CYANOCOBALAMIN) 100 MCG tablet   No current facility-administered medications for this encounter.    Myra Gianotti, PA-C Surgical Short Stay/Anesthesiology [facility] Phone [phone] [facility] Phone [phone] 11/27/2019 10:38 AM

## 2019-11-28 ENCOUNTER — Inpatient Hospital Stay (HOSPITAL_COMMUNITY)
Admission: RE | Admit: 2019-11-28 | Discharge: 2019-11-29 | DRG: 039 | Disposition: A | Discharge status: Home / Self Care | Payer: Medicare HMO | Attending: Vascular Surgery | Admitting: Vascular Surgery

## 2019-11-28 ENCOUNTER — Inpatient Hospital Stay (HOSPITAL_COMMUNITY): Payer: Medicare HMO | Admitting: Vascular Surgery

## 2019-11-28 ENCOUNTER — Inpatient Hospital Stay (HOSPITAL_COMMUNITY): Payer: Medicare HMO | Admitting: Anesthesiology

## 2019-11-28 ENCOUNTER — Encounter (HOSPITAL_COMMUNITY): Payer: Self-pay | Admitting: Vascular Surgery

## 2019-11-28 ENCOUNTER — Encounter (HOSPITAL_COMMUNITY)
Admission: RE | Disposition: A | Discharge status: Home / Self Care | Payer: Self-pay | Source: Home / Self Care | Attending: Vascular Surgery

## 2019-11-28 ENCOUNTER — Other Ambulatory Visit: Payer: Self-pay

## 2019-11-28 DIAGNOSIS — Z20822 Contact with and (suspected) exposure to covid-19: Secondary | ICD-10-CM | POA: Diagnosis present

## 2019-11-28 DIAGNOSIS — Z79890 Hormone replacement therapy (postmenopausal): Secondary | ICD-10-CM

## 2019-11-28 DIAGNOSIS — I6529 Occlusion and stenosis of unspecified carotid artery: Secondary | ICD-10-CM | POA: Diagnosis present

## 2019-11-28 DIAGNOSIS — I129 Hypertensive chronic kidney disease with stage 1 through stage 4 chronic kidney disease, or unspecified chronic kidney disease: Secondary | ICD-10-CM | POA: Diagnosis not present

## 2019-11-28 DIAGNOSIS — N189 Chronic kidney disease, unspecified: Secondary | ICD-10-CM | POA: Diagnosis not present

## 2019-11-28 DIAGNOSIS — Z85820 Personal history of malignant melanoma of skin: Secondary | ICD-10-CM

## 2019-11-28 DIAGNOSIS — I10 Essential (primary) hypertension: Secondary | ICD-10-CM | POA: Diagnosis present

## 2019-11-28 DIAGNOSIS — I6521 Occlusion and stenosis of right carotid artery: Secondary | ICD-10-CM | POA: Diagnosis not present

## 2019-11-28 DIAGNOSIS — E039 Hypothyroidism, unspecified: Secondary | ICD-10-CM | POA: Diagnosis not present

## 2019-11-28 DIAGNOSIS — Z79899 Other long term (current) drug therapy: Secondary | ICD-10-CM

## 2019-11-28 DIAGNOSIS — Z791 Long term (current) use of non-steroidal anti-inflammatories (NSAID): Secondary | ICD-10-CM | POA: Diagnosis not present

## 2019-11-28 DIAGNOSIS — Z7982 Long term (current) use of aspirin: Secondary | ICD-10-CM

## 2019-11-28 HISTORY — PX: PATCH ANGIOPLASTY: SHX6230

## 2019-11-28 HISTORY — PX: ENDARTERECTOMY: SHX5162

## 2019-11-28 LAB — ABO/RH: ABO/RH(D): A NEG

## 2019-11-28 SURGERY — ENDARTERECTOMY, CAROTID
Anesthesia: General | Laterality: Right

## 2019-11-28 MED ORDER — LEVOTHYROXINE SODIUM 75 MCG PO TABS
75.0000 ug | ORAL_TABLET | Freq: Every day | ORAL | Status: DC
  Administered 2019-11-29: 75 ug via ORAL
  Filled 2019-11-28: qty 1

## 2019-11-28 MED ORDER — EPHEDRINE SULFATE-NACL 50-0.9 MG/10ML-% IV SOSY
PREFILLED_SYRINGE | INTRAVENOUS | Status: DC | PRN
  Administered 2019-11-28 (×2): 5 mg via INTRAVENOUS

## 2019-11-28 MED ORDER — DEXAMETHASONE SODIUM PHOSPHATE 4 MG/ML IJ SOLN
INTRAMUSCULAR | Status: DC | PRN
  Administered 2019-11-28: 4 mg via INTRAVENOUS

## 2019-11-28 MED ORDER — ORAL CARE MOUTH RINSE: 15.0000 mL | Freq: Once | OROMUCOSAL | Status: AC

## 2019-11-28 MED ORDER — ROCURONIUM BROMIDE 10 MG/ML (PF) SYRINGE
PREFILLED_SYRINGE | INTRAVENOUS | Status: AC
  Filled 2019-11-28: qty 10

## 2019-11-28 MED ORDER — HEPARIN SODIUM (PORCINE) 1000 UNIT/ML IJ SOLN
INTRAMUSCULAR | Status: DC | PRN
  Administered 2019-11-28: 7000 [IU] via INTRAVENOUS

## 2019-11-28 MED ORDER — CHLORHEXIDINE GLUCONATE 0.12 % MT SOLN
15.0000 mL | Freq: Once | OROMUCOSAL | Status: DC
  Filled 2019-11-28: qty 15

## 2019-11-28 MED ORDER — SODIUM CHLORIDE 0.9 % IV BOLUS
500.0000 mL | Freq: Once | INTRAVENOUS | Status: AC
  Administered 2019-11-28: 500 mL via INTRAVENOUS

## 2019-11-28 MED ORDER — MAGNESIUM SULFATE 2 GM/50ML IV SOLN: 2.0000 g | Freq: Every day | INTRAVENOUS | Status: DC | PRN

## 2019-11-28 MED ORDER — HEPARIN SODIUM (PORCINE) 1000 UNIT/ML IJ SOLN
INTRAMUSCULAR | Status: AC
  Filled 2019-11-28: qty 1

## 2019-11-28 MED ORDER — PROTAMINE SULFATE 10 MG/ML IV SOLN
INTRAVENOUS | Status: AC
  Filled 2019-11-28: qty 5

## 2019-11-28 MED ORDER — SODIUM CHLORIDE 0.9 % IV SOLN: INTRAVENOUS | Status: DC

## 2019-11-28 MED ORDER — TRAMADOL HCL 50 MG PO TABS
50.0000 mg | ORAL_TABLET | Freq: Four times a day (QID) | ORAL | Status: DC | PRN
  Administered 2019-11-28: 50 mg via ORAL

## 2019-11-28 MED ORDER — GLYCOPYRROLATE 0.2 MG/ML IJ SOLN
INTRAMUSCULAR | Status: DC | PRN
  Administered 2019-11-28 (×2): .1 mg via INTRAVENOUS

## 2019-11-28 MED ORDER — DOPAMINE-DEXTROSE 3.2-5 MG/ML-% IV SOLN
INTRAVENOUS | Status: AC
  Administered 2019-11-28: 800 mg
  Filled 2019-11-28: qty 250

## 2019-11-28 MED ORDER — CHLORHEXIDINE GLUCONATE 0.12 % MT SOLN
15.0000 mL | Freq: Once | OROMUCOSAL | Status: AC
  Administered 2019-11-28: 15 mL via OROMUCOSAL

## 2019-11-28 MED ORDER — SODIUM CHLORIDE 0.9 % IV SOLN: 500.0000 mL | Freq: Once | INTRAVENOUS | Status: DC | PRN

## 2019-11-28 MED ORDER — PROTAMINE SULFATE 10 MG/ML IV SOLN
INTRAVENOUS | Status: DC | PRN
  Administered 2019-11-28 (×3): 10 mg via INTRAVENOUS
  Administered 2019-11-28: 20 mg via INTRAVENOUS

## 2019-11-28 MED ORDER — MORPHINE SULFATE (PF) 2 MG/ML IV SOLN
2.0000 mg | INTRAVENOUS | Status: DC | PRN
  Administered 2019-11-28: 2 mg via INTRAVENOUS
  Filled 2019-11-28: qty 1

## 2019-11-28 MED ORDER — ACETAMINOPHEN 325 MG PO TABS: 325.0000 mg | ORAL_TABLET | ORAL | Status: DC | PRN

## 2019-11-28 MED ORDER — TRAMADOL HCL 50 MG PO TABS
ORAL_TABLET | ORAL | Status: AC
Start: 2019-11-28 — End: 2019-11-29
  Filled 2019-11-28: qty 1

## 2019-11-28 MED ORDER — METOPROLOL TARTRATE 5 MG/5ML IV SOLN: 2.0000 mg | INTRAVENOUS | Status: DC | PRN

## 2019-11-28 MED ORDER — ALUM & MAG HYDROXIDE-SIMETH 200-200-20 MG/5ML PO SUSP: 15.0000 mL | ORAL | Status: DC | PRN

## 2019-11-28 MED ORDER — DONEPEZIL HCL 5 MG PO TABS
5.0000 mg | ORAL_TABLET | Freq: Every day | ORAL | Status: DC
  Administered 2019-11-28: 5 mg via ORAL
  Filled 2019-11-28 (×2): qty 1

## 2019-11-28 MED ORDER — ACETAMINOPHEN 10 MG/ML IV SOLN
1000.0000 mg | Freq: Once | INTRAVENOUS | Status: DC | PRN
  Administered 2019-11-28: 1000 mg via INTRAVENOUS

## 2019-11-28 MED ORDER — POTASSIUM CHLORIDE CRYS ER 20 MEQ PO TBCR: 20.0000 meq | EXTENDED_RELEASE_TABLET | Freq: Every day | ORAL | Status: DC | PRN

## 2019-11-28 MED ORDER — DOCUSATE SODIUM 100 MG PO CAPS
100.0000 mg | ORAL_CAPSULE | Freq: Every day | ORAL | Status: DC
  Administered 2019-11-29: 100 mg via ORAL
  Filled 2019-11-28: qty 1

## 2019-11-28 MED ORDER — SIMVASTATIN 5 MG PO TABS
10.0000 mg | ORAL_TABLET | Freq: Every day | ORAL | Status: DC
  Administered 2019-11-29: 10 mg via ORAL
  Filled 2019-11-28: qty 2

## 2019-11-28 MED ORDER — ONDANSETRON HCL 4 MG/2ML IJ SOLN
4.0000 mg | Freq: Four times a day (QID) | INTRAMUSCULAR | Status: DC | PRN
  Administered 2019-11-28: 4 mg via INTRAVENOUS
  Filled 2019-11-28: qty 2

## 2019-11-28 MED ORDER — SUGAMMADEX SODIUM 200 MG/2ML IV SOLN
INTRAVENOUS | Status: DC | PRN
  Administered 2019-11-28: 125 mg via INTRAVENOUS

## 2019-11-28 MED ORDER — PANTOPRAZOLE SODIUM 40 MG PO TBEC
40.0000 mg | DELAYED_RELEASE_TABLET | Freq: Every day | ORAL | Status: DC
  Administered 2019-11-28 – 2019-11-29 (×2): 40 mg via ORAL
  Filled 2019-11-28 (×2): qty 1

## 2019-11-28 MED ORDER — ONDANSETRON HCL 4 MG/2ML IJ SOLN: 4.0000 mg | Freq: Once | INTRAMUSCULAR | Status: DC | PRN

## 2019-11-28 MED ORDER — FENTANYL CITRATE (PF) 100 MCG/2ML IJ SOLN
INTRAMUSCULAR | Status: AC
Start: 2019-11-28 — End: 2019-11-29
  Filled 2019-11-28: qty 2

## 2019-11-28 MED ORDER — DOPAMINE-DEXTROSE 3.2-5 MG/ML-% IV SOLN
5.0000 ug/kg/min | INTRAVENOUS | Status: DC
  Administered 2019-11-28: 5 ug/kg/min via INTRAVENOUS

## 2019-11-28 MED ORDER — DOPAMINE-DEXTROSE 3.2-5 MG/ML-% IV SOLN: 5.0000 ug/kg/min | INTRAVENOUS | Status: DC

## 2019-11-28 MED ORDER — ONDANSETRON HCL 4 MG/2ML IJ SOLN
INTRAMUSCULAR | Status: AC
  Filled 2019-11-28: qty 2

## 2019-11-28 MED ORDER — LACTATED RINGERS IV SOLN: INTRAVENOUS | Status: DC

## 2019-11-28 MED ORDER — ONDANSETRON HCL 4 MG/2ML IJ SOLN
INTRAMUSCULAR | Status: DC | PRN
  Administered 2019-11-28: 4 mg via INTRAVENOUS

## 2019-11-28 MED ORDER — VANCOMYCIN HCL IN DEXTROSE 1-5 GM/200ML-% IV SOLN
1000.0000 mg | INTRAVENOUS | Status: AC
  Administered 2019-11-28: 1000 mg via INTRAVENOUS
  Filled 2019-11-28: qty 200

## 2019-11-28 MED ORDER — PHENOL 1.4 % MT LIQD: 1.0000 | OROMUCOSAL | Status: DC | PRN

## 2019-11-28 MED ORDER — ORAL CARE MOUTH RINSE: 15.0000 mL | Freq: Once | OROMUCOSAL | Status: DC

## 2019-11-28 MED ORDER — ASPIRIN EC 81 MG PO TBEC
81.0000 mg | DELAYED_RELEASE_TABLET | Freq: Every day | ORAL | Status: DC
  Administered 2019-11-29: 81 mg via ORAL
  Filled 2019-11-28: qty 1

## 2019-11-28 MED ORDER — LIDOCAINE 2% (20 MG/ML) 5 ML SYRINGE
INTRAMUSCULAR | Status: DC | PRN
  Administered 2019-11-28: 60 mg via INTRAVENOUS

## 2019-11-28 MED ORDER — ACETAMINOPHEN 325 MG RE SUPP
325.0000 mg | RECTAL | Status: DC | PRN
  Filled 2019-11-28: qty 2

## 2019-11-28 MED ORDER — FENTANYL CITRATE (PF) 250 MCG/5ML IJ SOLN
INTRAMUSCULAR | Status: AC
Start: 2019-11-28 — End: ?
  Filled 2019-11-28: qty 5

## 2019-11-28 MED ORDER — LIDOCAINE 2% (20 MG/ML) 5 ML SYRINGE
INTRAMUSCULAR | Status: AC
  Filled 2019-11-28: qty 5

## 2019-11-28 MED ORDER — SUGAMMADEX SODIUM 500 MG/5ML IV SOLN
INTRAVENOUS | Status: AC
  Filled 2019-11-28: qty 5

## 2019-11-28 MED ORDER — ROCURONIUM BROMIDE 10 MG/ML (PF) SYRINGE
PREFILLED_SYRINGE | INTRAVENOUS | Status: DC | PRN
  Administered 2019-11-28: 40 mg via INTRAVENOUS
  Administered 2019-11-28: 10 mg via INTRAVENOUS

## 2019-11-28 MED ORDER — HYDROCHLOROTHIAZIDE 25 MG PO TABS
25.0000 mg | ORAL_TABLET | Freq: Every day | ORAL | Status: DC
  Administered 2019-11-29: 25 mg via ORAL
  Filled 2019-11-28 (×2): qty 1

## 2019-11-28 MED ORDER — ALPRAZOLAM 0.5 MG PO TABS
1.0000 mg | ORAL_TABLET | Freq: Every day | ORAL | Status: DC
  Administered 2019-11-28: 1 mg via ORAL
  Filled 2019-11-28: qty 2

## 2019-11-28 MED ORDER — SODIUM CHLORIDE 0.9 % IV SOLN
0.0125 ug/kg/min | INTRAVENOUS | Status: DC
  Administered 2019-11-28: .05 ug/kg/min via INTRAVENOUS
  Filled 2019-11-28: qty 2000

## 2019-11-28 MED ORDER — SODIUM CHLORIDE 0.9 % IV SOLN
INTRAVENOUS | Status: AC
  Filled 2019-11-28: qty 1.2

## 2019-11-28 MED ORDER — ACETAMINOPHEN 10 MG/ML IV SOLN
INTRAVENOUS | Status: AC
  Filled 2019-11-28: qty 100

## 2019-11-28 MED ORDER — 0.9 % SODIUM CHLORIDE (POUR BTL) OPTIME
TOPICAL | Status: DC | PRN
  Administered 2019-11-28: 2000 mL

## 2019-11-28 MED ORDER — LIDOCAINE HCL (PF) 1 % IJ SOLN
INTRAMUSCULAR | Status: AC
  Filled 2019-11-28: qty 30

## 2019-11-28 MED ORDER — EPHEDRINE 5 MG/ML INJ
INTRAVENOUS | Status: AC
  Filled 2019-11-28: qty 10

## 2019-11-28 MED ORDER — NITROGLYCERIN 0.2 MG/ML ON CALL CATH LAB
INTRAVENOUS | Status: DC | PRN
  Administered 2019-11-28 (×2): 20 ug via INTRAVENOUS

## 2019-11-28 MED ORDER — CHLORHEXIDINE GLUCONATE CLOTH 2 % EX PADS: 6.0000 | MEDICATED_PAD | Freq: Once | CUTANEOUS | Status: DC

## 2019-11-28 MED ORDER — GLYCOPYRROLATE PF 0.2 MG/ML IJ SOSY
PREFILLED_SYRINGE | INTRAMUSCULAR | Status: AC
  Filled 2019-11-28: qty 1

## 2019-11-28 MED ORDER — GUAIFENESIN-DM 100-10 MG/5ML PO SYRP: 15.0000 mL | ORAL_SOLUTION | ORAL | Status: DC | PRN

## 2019-11-28 MED ORDER — FENTANYL CITRATE (PF) 100 MCG/2ML IJ SOLN
INTRAMUSCULAR | Status: DC | PRN
Start: 2019-11-28 — End: 2019-11-28
  Administered 2019-11-28: 100 ug via INTRAVENOUS

## 2019-11-28 MED ORDER — HYDRALAZINE HCL 20 MG/ML IJ SOLN: 5.0000 mg | INTRAMUSCULAR | Status: DC | PRN

## 2019-11-28 MED ORDER — SODIUM CHLORIDE 0.9 % IV SOLN
INTRAVENOUS | Status: DC | PRN
  Administered 2019-11-28: 150 mL

## 2019-11-28 MED ORDER — FENTANYL CITRATE (PF) 100 MCG/2ML IJ SOLN
25.0000 ug | INTRAMUSCULAR | Status: DC | PRN
  Administered 2019-11-28: 25 ug via INTRAVENOUS

## 2019-11-28 MED ORDER — LABETALOL HCL 5 MG/ML IV SOLN: 10.0000 mg | INTRAVENOUS | Status: DC | PRN

## 2019-11-28 MED ORDER — PHENYLEPHRINE HCL-NACL 10-0.9 MG/250ML-% IV SOLN
INTRAVENOUS | Status: DC | PRN
  Administered 2019-11-28: 50 ug/min via INTRAVENOUS

## 2019-11-28 MED ORDER — PHENYLEPHRINE 40 MCG/ML (10ML) SYRINGE FOR IV PUSH (FOR BLOOD PRESSURE SUPPORT)
PREFILLED_SYRINGE | INTRAVENOUS | Status: AC
  Filled 2019-11-28: qty 10

## 2019-11-28 MED ORDER — ALPRAZOLAM 0.25 MG PO TABS
0.2500 mg | ORAL_TABLET | Freq: Every day | ORAL | Status: DC
  Administered 2019-11-29: 0.25 mg via ORAL
  Filled 2019-11-28: qty 1

## 2019-11-28 MED ORDER — DEXAMETHASONE SODIUM PHOSPHATE 10 MG/ML IJ SOLN
INTRAMUSCULAR | Status: AC
  Filled 2019-11-28: qty 1

## 2019-11-28 MED ORDER — PROPOFOL 10 MG/ML IV BOLUS
INTRAVENOUS | Status: AC
  Filled 2019-11-28: qty 20

## 2019-11-28 MED ORDER — PROPOFOL 10 MG/ML IV BOLUS
INTRAVENOUS | Status: DC | PRN
  Administered 2019-11-28: 200 mg via INTRAVENOUS

## 2019-11-28 MED ORDER — ALPRAZOLAM 0.25 MG PO TABS: 0.2500 mg | ORAL_TABLET | Freq: Every day | ORAL | Status: DC | PRN

## 2019-11-28 SURGICAL SUPPLY — 40 items
ADH SKN CLS APL DERMABOND .7 (GAUZE/BANDAGES/DRESSINGS) ×1
CANISTER SUCT 3000ML PPV (MISCELLANEOUS) ×2 IMPLANT
CANNULA VESSEL 3MM 2 BLNT TIP (CANNULA) ×4 IMPLANT
CATH ROBINSON RED A/P 18FR (CATHETERS) ×2 IMPLANT
CLIP LIGATING EXTRA MED SLVR (CLIP) ×2 IMPLANT
CLIP LIGATING EXTRA SM BLUE (MISCELLANEOUS) ×2 IMPLANT
COVER WAND RF STERILE (DRAPES) ×1 IMPLANT
DECANTER SPIKE VIAL GLASS SM (MISCELLANEOUS) IMPLANT
DERMABOND ADVANCED (GAUZE/BANDAGES/DRESSINGS) ×1
DERMABOND ADVANCED .7 DNX12 (GAUZE/BANDAGES/DRESSINGS) ×1 IMPLANT
DRAIN HEMOVAC 1/8 X 5 (WOUND CARE) IMPLANT
ELECT REM PT RETURN 9FT ADLT (ELECTROSURGICAL) ×2
ELECTRODE REM PT RTRN 9FT ADLT (ELECTROSURGICAL) ×1 IMPLANT
EVACUATOR SILICONE 100CC (DRAIN) IMPLANT
GLOVE SS BIOGEL STRL SZ 7.5 (GLOVE) ×1 IMPLANT
GLOVE SUPERSENSE BIOGEL SZ 7.5 (GLOVE) ×1
GOWN STRL REUS W/ TWL LRG LVL3 (GOWN DISPOSABLE) ×3 IMPLANT
GOWN STRL REUS W/TWL LRG LVL3 (GOWN DISPOSABLE) ×6
KIT BASIN OR (CUSTOM PROCEDURE TRAY) ×2 IMPLANT
KIT SHUNT ARGYLE CAROTID ART 6 (VASCULAR PRODUCTS) IMPLANT
KIT TURNOVER KIT B (KITS) ×2 IMPLANT
NEEDLE 22X1 1/2 (OR ONLY) (NEEDLE) IMPLANT
NS IRRIG 1000ML POUR BTL (IV SOLUTION) ×4 IMPLANT
PACK CAROTID (CUSTOM PROCEDURE TRAY) ×2 IMPLANT
PAD ARMBOARD 7.5X6 YLW CONV (MISCELLANEOUS) ×4 IMPLANT
PATCH HEMASHIELD 8X75 (Vascular Products) ×1 IMPLANT
POSITIONER HEAD DONUT 9IN (MISCELLANEOUS) ×2 IMPLANT
SHUNT CAROTID BYPASS 10 (VASCULAR PRODUCTS) ×1 IMPLANT
SHUNT CAROTID BYPASS 12FRX15.5 (VASCULAR PRODUCTS) IMPLANT
SUT ETHILON 3 0 PS 1 (SUTURE) IMPLANT
SUT PROLENE 6 0 CC (SUTURE) ×3 IMPLANT
SUT SILK 3 0 (SUTURE) ×2
SUT SILK 3-0 18XBRD TIE 12 (SUTURE) IMPLANT
SUT VIC AB 3-0 SH 27 (SUTURE) ×4
SUT VIC AB 3-0 SH 27X BRD (SUTURE) ×2 IMPLANT
SUT VICRYL 4-0 PS2 18IN ABS (SUTURE) ×2 IMPLANT
SYR BULB IRRIG 60ML STRL (SYRINGE) ×1 IMPLANT
SYR CONTROL 10ML LL (SYRINGE) IMPLANT
TOWEL GREEN STERILE (TOWEL DISPOSABLE) ×2 IMPLANT
WATER STERILE IRR 1000ML POUR (IV SOLUTION) ×2 IMPLANT

## 2019-11-28 NOTE — Anesthesia Procedure Notes (Signed)
Arterial Line Insertion Start/End9/24/2021 11:00 AM, 11/28/2019 11:10 AM Performed by: Verdie Drown, CRNA, CRNA  Patient location: Pre-op. Preanesthetic checklist: patient identified, IV checked, risks and benefits discussed, surgical consent, monitors and equipment checked, pre-op evaluation and anesthesia consent Lidocaine 1% used for infiltration Left, radial was placed Catheter size: 20 G Hand hygiene performed  and maximum sterile barriers used   Attempts: 2 Procedure performed without using ultrasound guided technique. Following insertion, dressing applied and Biopatch. Post procedure assessment: normal  Patient tolerated the procedure well with no immediate complications.

## 2019-11-28 NOTE — Anesthesia Postprocedure Evaluation (Signed)
Anesthesia Post Note  Patient: Melinda Payne  Procedure(s) Performed: RIGHT CAROTID ENDARTERECTOMY (Right ) PATCH ANGIOPLASTY RIGHT CAROTID (Right )     Patient location during evaluation: PACU Anesthesia Type: General Level of consciousness: awake and alert Pain management: pain level controlled Vital Signs Assessment: post-procedure vital signs reviewed and stable Respiratory status: spontaneous breathing, nonlabored ventilation and respiratory function stable Cardiovascular status: blood pressure returned to baseline and stable Postop Assessment: no apparent nausea or vomiting Anesthetic complications: no   No complications documented.  Last Vitals:  Vitals:   11/28/19 1520 11/28/19 1535  BP: (!) 118/58 (!) 110/48  Pulse: (!) 54 (!) 56  Resp: 14 14  Temp:  36.5 C  SpO2: 100% 100%    Last Pain:  Vitals:   11/28/19 1520  TempSrc:   PainSc: 2                  Lynda Rainwater

## 2019-11-28 NOTE — H&P (Signed)
Office Visit 09/23/2019 [facility] -Judge Stall, Arvilla Meres, MD Vascular Surgery  Stenosis of carotid artery, unspecified laterality Dx  New Patient (Initial Visit); Referred by Elby Showers, MD Reason for Visit  Additional Documentation  Vitals:  BP 156/66Important   Pulse 63  Temp 97.1 F (36.2 C)Important (Temporal)  Ht 5\' 1"  (1.549 m)  Wt 69.9 kg  SpO2 97%  BMI 29.10 kg/m  BSA 1.73 m    More Vitals  Flowsheets:  Clinical Intake,  Healthcare Directives,  NEWS,  MEWS Score,  Anthropometrics,  Method of Visit    Encounter Info:  Billing Info,  History,  Allergies,  Detailed Report    All Notes  Progress Notes by Rosetta Posner, MD at 09/23/2019 12:40 PM Author: Rosetta Posner, MD Author Type: Physician Filed: 09/23/2019 5:25 PM  Note Status: Signed Cosign: Cosign Not Required Encounter Date: 09/23/2019  Editor: Rosetta Posner, MD (Physician)                                                  [facility]  Patient name: Melinda Payne      MRN: [id number]        DOB: 25-Dec-1940          Sex: female  REASON FOR CONSULT: Evaluation right carotid stenosis  HPI: Melinda Payne is a 79 y.o. female, who is here today for evaluation.  She is found to have a right carotid bruit and underwent duplex showing critical stenosis in her right internal carotid artery with no significant stenosis in the left carotid system.  She is right-handed.  She specifically denies any prior episodes of amaurosis fugax, expressive aphasia or transient ischemic attack or stroke.  She had does not have any history of cardiac disease.  She really has not minimal risk factors for peripheral vascular disease.  She is 1 of 16 children and does have atherosclerotic disease and some family members but most of these were cigarette smokers.  She has never smoked.      Past Medical History:  Diagnosis Date   Cancer ([place]) 1997    melanoma rt neck   Counseling for estrogen replacement therapy    Fibrocystic breast disease    Hypertension    Insomnia    Spinal stenosis    Vitamin D deficiency          Family History  Problem Relation Age of Onset   Alcohol abuse Father    Alzheimer's disease Brother    Dementia Brother    Alzheimer's disease Sister    Dementia Sister     SOCIAL HISTORY: Social History        Socioeconomic History   Marital status: Married    Spouse name: Not on file   Number of children: Not on file   Years of education: Not on file   Highest education level: Not on file  Occupational History   Not on file  Tobacco Use   Smoking status: Never Smoker   Smokeless tobacco: Never Used  Substance and Sexual Activity   Alcohol use: No   Drug use: No   Sexual activity: Not on file  Other Topics Concern   Not on file  Social History Narrative   Not on file   Social Determinants of  Health      Financial Resource Strain:    Difficulty of Paying Living Expenses:   Food Insecurity:    Worried About Charity fundraiser in the Last Year:    Arboriculturist in the Last Year:   Transportation Needs:    Film/video editor (Medical):    Lack of Transportation (Non-Medical):   Physical Activity:    Days of Exercise per Week:    Minutes of Exercise per Session:   Stress:    Feeling of Stress :   Social Connections:    Frequency of Communication with Friends and Family:    Frequency of Social Gatherings with Friends and Family:    Attends Religious Services:    Active Member of Clubs or Organizations:    Attends Archivist Meetings:    Marital Status:   Intimate Partner Violence:    Fear of Current or Ex-Partner:    Emotionally Abused:    Physically Abused:    Sexually Abused:         Allergies  Allergen Reactions   Penicillins Hives   Codeine Hives          Current Outpatient Medications   Medication Sig Dispense Refill   ALPRAZolam (XANAX) 0.25 MG tablet TAKE 1 TABLET BY MOUTH THREE TIMES A DAY 90 tablet 2   ALPRAZolam (XANAX) 1 MG tablet TAKE ONE TABLET BY MOUTH EVERY NIGHT AT BEDTIME AS NEEDED FOR INSOMNIA 90 tablet 1   aspirin 81 MG tablet Take 81 mg by mouth daily.       Cholecalciferol (VITAMIN D-3) 1000 units CAPS Take by mouth.     donepezil (ARICEPT) 5 MG tablet TAKE ONE TABLET BY MOUTH AT BEDTIME 90 tablet 2   hydrochlorothiazide (HYDRODIURIL) 25 MG tablet TAKE ONE TABLET BY MOUTH DAILY 90 tablet 1   levothyroxine (SYNTHROID) 75 MCG tablet TAKE ONE TABLET BY MOUTH DAILY 90 tablet 0   meloxicam (MOBIC) 7.5 MG tablet Take 7.5 mg by mouth daily.     Multiple Vitamins-Minerals (MULTIVITAMIN WITH MINERALS) tablet Take 1 tablet by mouth daily.       omega-3 acid ethyl esters (LOVAZA) 1 G capsule Take 2 g by mouth daily.      simvastatin (ZOCOR) 10 MG tablet TAKE ONE TABLET BY MOUTH DAILY AT 6PM 90 tablet 2   vitamin B-12 (CYANOCOBALAMIN) 100 MCG tablet Take 100 mcg by mouth daily.      vitamin C (ASCORBIC ACID) 500 MG tablet Take 500 mg by mouth daily.     No current facility-administered medications for this visit.    REVIEW OF SYSTEMS:  [X]  denotes positive finding, [ ]  denotes negative finding Cardiac  Comments:  Chest pain or chest pressure:    Shortness of breath upon exertion:    Short of breath when lying flat:    Irregular heart rhythm:        Vascular    Pain in calf, thigh, or hip brought on by ambulation:    Pain in feet at night that wakes you up from your sleep:     Blood clot in your veins:    Leg swelling:         Pulmonary    Oxygen at home:    Productive cough:     Wheezing:         Neurologic    Sudden weakness in arms or legs:     Sudden numbness in arms or legs:  Sudden onset of difficulty speaking or slurred speech:    Temporary loss of vision in one eye:      Problems with dizziness:         Gastrointestinal    Blood in stool:     Vomited blood:         Genitourinary    Burning when urinating:     Blood in urine:        Psychiatric    Major depression:         Hematologic    Bleeding problems:    Problems with blood clotting too easily:        Skin    Rashes or ulcers:        Constitutional    Fever or chills:      PHYSICAL EXAM:     Vitals:   09/23/19 1243 09/23/19 1258  BP: (!) 156/66   Pulse: 63   Temp:  (!) 97.1 F (36.2 C)  TempSrc:  Temporal  SpO2: 97%   Weight: 154 lb (69.9 kg)   Height: 5\' 1"  (1.549 m)     GENERAL: The patient is a well-nourished female, in no acute distress. The vital signs are documented above. CARDIOVASCULAR: Right carotid bruit no bruit on the left.  2+ radial and 2+ dorsalis pedis pulses bilaterally PULMONARY: There is good air exchange  ABDOMEN: Soft and non-tender  MUSCULOSKELETAL: There are no major deformities or cyanosis. NEUROLOGIC: No focal weakness or paresthesias are detected. SKIN: There are no ulcers or rashes noted. PSYCHIATRIC: The patient has a normal affect.  DATA:  Carotid duplex was revealed showing a critical stenosis on the right.  We repeated the right carotid duplex to determine if she was a candidate for surgery based on duplex alone.  She does have normal internal carotid distal to the stenosis at the bifurcation  MEDICAL ISSUES: Critical asymptomatic stenosis right internal carotid artery.  I discussed the significance of this with the patient and recommended endarterectomy for reduction of stroke risk.  I explained the procedure in detail including expected 1 night hospitalization.  Also explained the 1 to 2% risk of stroke with surgery.  She understands and wished to proceed.  She does have a upcoming vacation in [place] September and I have suggested that she wait until after the vacation to schedule  surgery.  Explained that her risk over 1 to 2 months would certainly be comfortable to her risk for surgery.   Rosetta Posner, MD FACS [facility] [phone] Pager [phone]       Communications    Addendum:  The patient has been re-examined and re-evaluated.  The patient's history and physical has been reviewed and is unchanged.    Melinda Payne is a 79 y.o. female is being admitted with RIGHT CAROTID STENOSIS. All the risks, benefits and other treatment options have been discussed with the patient. The patient has consented to proceed with Procedure(s): RIGHT CAROTID ENDARTERECTOMY as a surgical intervention.  Melinda Payne 11/28/2019 11:47 AM [facility]

## 2019-11-28 NOTE — Anesthesia Procedure Notes (Signed)
Procedure Name: Intubation Date/Time: 11/28/2019 12:44 PM Performed by: Jenne Campus, CRNA Pre-anesthesia Checklist: Patient identified, Emergency Drugs available, Suction available and Patient being monitored Patient Re-evaluated:Patient Re-evaluated prior to induction Oxygen Delivery Method: Circle System Utilized Preoxygenation: Pre-oxygenation with 100% oxygen Induction Type: IV induction Ventilation: Mask ventilation without difficulty Laryngoscope Size: Miller and 2 Grade View: Grade I Tube type: Oral Tube size: 7.0 mm Number of attempts: 1 Airway Equipment and Method: Stylet and Oral airway Placement Confirmation: ETT inserted through vocal cords under direct vision,  positive ETCO2 and breath sounds checked- equal and bilateral Secured at: 23 cm Tube secured with: Tape Dental Injury: Teeth and Oropharynx as per pre-operative assessment

## 2019-11-28 NOTE — Op Note (Signed)
   OPERATIVE REPORT  DATE OF SURGERY: 11/28/2019  PATIENT: Melinda Payne, 79 y.o. female MRN: [id number]  DOB: 16-Mar-1940  PRE-OPERATIVE DIAGNOSIS: Right Carotid Stenosis, Asymptomatic  POST-OPERATIVE DIAGNOSIS:  Same  PROCEDURE:  Right Carotid Endarterectomy with Dacron Patch Angioplasty  SURGEON:  Curt Jews, M.D.  PHYSICIAN ASSISTANT: Eveland PAC  The assistant was needed for exposure and to expedite the case  ANESTHESIA:   general  EBL: Less than 200 ml  Total I/O In: 1250 [I.V.:1000; Other:50; IV Piggyback:200] Out: 125 [Blood:125]  BLOOD ADMINISTERED: none  DRAINS: none   SPECIMEN: none  COUNTS CORRECT:  YES  PLAN OF CARE: Admit to inpatient   PATIENT DISPOSITION:  PACU - hemodynamically stable and neurologically intact.  PROCEDURE DETAILS: The patient was taken to the operating room placed in supine position.  General anesthesia was administered.  The neck was prepped and draped in the usual sterile fashion.  An incision was made anterior to the sternocleidomastoid and carried down through the platysma with electrocautery.  The sternocleidomastoid was reflected posteriorly and the carotid sheath was opened.  The facial vein was ligated with 2-0 silk ties and divided.  The common carotid artery was encircled with an umbilical tape and Rummel tourniquet.  The vagus nerve was identified and preserved.  Dissection was continued onto the carotid bifurcation.  The superior thyroid artery was encircled with a 2-0 silk Potts tie.  The external carotid was encircled with a blue vessel loop and the internal carotid was encircled with an umbilical tape and Rummel tourniquet.  The hypoglossal nerve was identified and preserved.  The patient was given systemic heparin and after adequate circulation time, the internal, external and common carotid arteries were occluded with vascular clamps.  The common carotid artery was opened with an 11 blade and extended  longitudinally with  Potts scissors.  A 10 shunt was passed up the internal carotid and allowed to backbleed.  It was then passed down the common carotid where it was secured with Rummel tourniquet.  The endarterectomy was begun on the common carotid artery and the plaque was divided proximally with Potts scissors.  The endarterectomy was continued onto the bifurcation.  The external carotid was endarterectomized with an eversion technique and the internal carotid was endarterectomized in an open fashion.  Remaining atheromatous debris was removed from the endarterectomy plane.  A Finesse Hemashield Dacron patch was brought onto the field and was sewn as a patch angioplasty with a running 6-0 Prolene suture.  Prior to completion of the closure the shunt was removed and the usual flushing maneuvers were undertaken.  The anastomosis was completed and flow was restored first to the external and then the internal carotid artery.  Excellent flow characteristics were noted with hand-held Doppler in the internal and external carotid arteries.  The patient was given 50 mg of protamine to reverse the heparin.  The wounds were irrigated with saline.  Hemostasis was obtained with electrocautery.  The wounds were closed with 3-0 Vicryl to reapproximate the sternocleidomastoid over the carotid sheath.  The platysma was lysed with a running 3-0 Vicryl suture.  The skin was closed with a 4-0 subcuticular Vicryl stitch.  Dermabond was applied.  The patient was awakened neurologically intact in the operating room and transferred to the recovery room in stable condition   Curt Jews, M.D. 11/28/2019 3:04 PM

## 2019-11-28 NOTE — Transfer of Care (Signed)
Immediate Anesthesia Transfer of Care Note  Patient: Melinda Payne  Procedure(s) Performed: RIGHT CAROTID ENDARTERECTOMY (Right ) PATCH ANGIOPLASTY RIGHT CAROTID (Right )  Patient Location: PACU  Anesthesia Type:General  Level of Consciousness: awake, alert , oriented and patient cooperative  Airway & Oxygen Therapy: Patient Spontanous Breathing and Patient connected to nasal cannula oxygen  Post-op Assessment: Report given to RN and Post -op Vital signs reviewed and stable  Post vital signs: Reviewed and stable  Last Vitals:  Vitals Value Taken Time  BP 125/52 11/28/19 1450  Temp    Pulse 70 11/28/19 1453  Resp 16 11/28/19 1453  SpO2 100 % 11/28/19 1453  Vitals shown include unvalidated device data.  Last Pain:  Vitals:   11/28/19 1058  TempSrc:   PainSc: 0-No pain         Complications: No complications documented.

## 2019-11-28 NOTE — Progress Notes (Signed)
  Progress Note    11/28/2019 3:35 PM Day of Surgery  Subjective:  Denies strokelike symptoms including slurring speech, changes in vision, or one sided weakness   Vitals:   11/28/19 1006 11/28/19 1450  BP: (!) 169/66 (!) 125/52  Pulse: 68 86  Resp: 18 11  Temp: 97.9 F (36.6 C) 97.7 F (36.5 C)  SpO2: 100% 100%   Physical Exam: Lungs:  Non labored Incisions:  R neck incision c/d/i without hematoma Extremities:  Moving all extremities well Abdomen:  Soft, NT Neurologic: CN grossly intact  CBC    Component Value Date/Time   WBC 6.2 11/26/2019 1000   RBC 4.18 11/26/2019 1000   HGB 13.4 11/26/2019 1000   HCT 40.5 11/26/2019 1000   PLT 173 11/26/2019 1000   MCV 96.9 11/26/2019 1000   MCH 32.1 11/26/2019 1000   MCHC 33.1 11/26/2019 1000   RDW 12.0 11/26/2019 1000   LYMPHSABS 2,459 02/07/2019 0923   MONOABS 594 12/23/2015 1005   EOSABS 52 02/07/2019 0923   BASOSABS 0 02/07/2019 0923    BMET    Component Value Date/Time   NA 136 11/26/2019 1000   K 3.4 (L) 11/26/2019 1000   CL 99 11/26/2019 1000   CO2 26 11/26/2019 1000   GLUCOSE 96 11/26/2019 1000   BUN 16 11/26/2019 1000   CREATININE 1.24 (H) 11/26/2019 1000   CREATININE 1.23 (H) 08/19/2019 1003   CALCIUM 9.7 11/26/2019 1000   GFRNONAA 42 (L) 11/26/2019 1000   GFRNONAA 36 (L) 02/07/2019 0923   GFRAA 49 (L) 11/26/2019 1000   GFRAA 41 (L) 02/07/2019 0923    INR    Component Value Date/Time   INR 1.0 11/26/2019 1000     Intake/Output Summary (Last 24 hours) at 11/28/2019 1535 Last data filed at 11/28/2019 1450 Gross per 24 hour  Intake 1250 ml  Output 125 ml  Net 1125 ml     Assessment/Plan:  79 y.o. female is s/p R CEA Day of Surgery   Neuro exam stable compared to pre-op R neck incision unremarkable Patient has a 2c bed Hopeful for d/c home tomorrow   Dagoberto Ligas, PA-C [facility] [phone] 11/28/2019 3:35 PM

## 2019-11-29 LAB — CBC
HCT: 34.8 % — ABNORMAL LOW (ref 36.0–46.0)
Hemoglobin: 12.1 g/dL (ref 12.0–15.0)
MCH: 33 pg (ref 26.0–34.0)
MCHC: 34.8 g/dL (ref 30.0–36.0)
MCV: 94.8 fL (ref 80.0–100.0)
Platelets: 178 10*3/uL (ref 150–400)
RBC: 3.67 MIL/uL — ABNORMAL LOW (ref 3.87–5.11)
RDW: 12.1 % (ref 11.5–15.5)
WBC: 11.5 10*3/uL — ABNORMAL HIGH (ref 4.0–10.5)
nRBC: 0 % (ref 0.0–0.2)

## 2019-11-29 LAB — BASIC METABOLIC PANEL
Anion gap: 5 (ref 5–15)
BUN: 15 mg/dL (ref 8–23)
CO2: 25 mmol/L (ref 22–32)
Calcium: 8.6 mg/dL — ABNORMAL LOW (ref 8.9–10.3)
Chloride: 109 mmol/L (ref 98–111)
Creatinine, Ser: 1.14 mg/dL — ABNORMAL HIGH (ref 0.44–1.00)
GFR calc Af Amer: 54 mL/min — ABNORMAL LOW (ref >60)
GFR calc non Af Amer: 46 mL/min — ABNORMAL LOW (ref >60)
Glucose, Bld: 151 mg/dL — ABNORMAL HIGH (ref 70–99)
Potassium: 3.9 mmol/L (ref 3.5–5.1)
Sodium: 139 mmol/L (ref 135–145)

## 2019-11-29 MED ORDER — HYDROCODONE-ACETAMINOPHEN 5-325 MG PO TABS
1.0000 | ORAL_TABLET | ORAL | 0 refills | Status: DC | PRN
Start: 2019-11-29 — End: 2019-12-15

## 2019-11-29 NOTE — TOC Transition Note (Signed)
Transition of Care [facility]) - CM/SW Discharge Note   Patient Details  Name: Melinda Payne MRN: [id number] Date of Birth: Mar 21, 1940  [facility] CM/SW Contact:  Pollie Friar, RN Phone Number: 11/29/2019, 10:44 AM   Clinical Narrative:    Pt discharging home with self care. No needs per TOC.   Final next level of care: Home/Self Care Barriers to Discharge: No Barriers Identified   Patient Goals and CMS Choice        Discharge Placement                       Discharge Plan and Services                                     Social Determinants of Health (SDOH) Interventions     Readmission Risk Interventions No flowsheet data found.

## 2019-11-29 NOTE — Plan of Care (Signed)
  Problem: Education: Goal: Knowledge of General Education information will improve Description Including pain rating scale, medication(s)/side effects and non-pharmacologic comfort measures Outcome: Adequate for Discharge   Problem: Health Behavior/Discharge Planning: Goal: Ability to manage health-related needs will improve Outcome: Adequate for Discharge   

## 2019-11-29 NOTE — Progress Notes (Signed)
   VASCULAR SURGERY ASSESSMENT & PLAN:   POD 1 RIGHT CEA: Doing well. Ready for discharge this morning.  VASCULAR QUALITY INITIATIVE: She is on aspirin and is on a statin.  Follow-up with Dr. Donnetta Hutching in 2 to 3 weeks.   SUBJECTIVE:   No complaints.  PHYSICAL EXAM:   Vitals:   11/28/19 2200 11/29/19 0000 11/29/19 0200 11/29/19 0400  BP: (!) 177/48 (!) 143/52 (!) 144/49 (!) 126/49  Pulse: 65 85 85 (!) 58  Resp: 17 15 16 17   Temp: 97.8 F (36.6 C) 97.6 F (36.4 C)  97.6 F (36.4 C)  TempSrc:  Oral  Oral  SpO2: 90% 92% 97% 97%  Weight:      Height:       Incision looks fine. NEURO: No focal weakness or paresthesias.  LABS:   Lab Results  Component Value Date   WBC 11.5 (H) 11/29/2019   HGB 12.1 11/29/2019   HCT 34.8 (L) 11/29/2019   MCV 94.8 11/29/2019   PLT 178 11/29/2019   Lab Results  Component Value Date   CREATININE 1.14 (H) 11/29/2019    PROBLEM LIST:    Active Problems:   Carotid artery stenosis   CURRENT MEDS:   . ALPRAZolam  0.25 mg Oral Daily  . ALPRAZolam  1 mg Oral QHS  . aspirin EC  81 mg Oral Daily  . docusate sodium  100 mg Oral Daily  . donepezil  5 mg Oral QHS  . hydrochlorothiazide  25 mg Oral Daily  . levothyroxine  75 mcg Oral QAC breakfast  . pantoprazole  40 mg Oral Daily  . simvastatin  10 mg Oral Daily    Deitra Mayo Office: [phone] 11/29/2019

## 2019-11-29 NOTE — Plan of Care (Signed)

## 2019-11-29 NOTE — Discharge Instructions (Signed)
   [facility] [place]  Discharge Instructions   Carotid Endarterectomy (CEA)  Please refer to the following instructions for your post-procedure care. Your surgeon or physician assistant will discuss any changes with you.  Activity  You are encouraged to walk as much as you can. You can slowly return to normal activities but must avoid strenuous activity and heavy lifting until your doctor tell you it's OK. Avoid activities such as vacuuming or swinging a golf club. You can drive after one week if you are comfortable and you are no longer taking prescription pain medications. It is normal to feel tired for serval weeks after your surgery. It is also normal to have difficulty with sleep habits, eating, and bowel movements after surgery. These will go away with time.  Bathing/Showering  You may shower after you come home. Do not soak in a bathtub, hot tub, or swim until the incision heals completely.  Incision Care  Shower every day. Clean your incision with mild soap and water. Pat the area dry with a clean towel. You do not need a bandage unless otherwise instructed. Do not apply any ointments or creams to your incision. You may have skin glue on your incision. Do not peel it off. It will come off on its own in about one week. Your incision may feel thickened and raised for several weeks after your surgery. This is normal and the skin will soften over time. For Men Only: It's OK to shave around the incision but do not shave the incision itself for 2 weeks. It is common to have numbness under your chin that could last for several months.  Diet  Resume your normal diet. There are no special food restrictions following this procedure. A low fat/low cholesterol diet is recommended for all patients with vascular disease. In order to heal from your surgery, it is CRITICAL to get adequate nutrition. Your body requires vitamins, minerals, and protein. Vegetables are the best  source of vitamins and minerals. Vegetables also provide the perfect balance of protein. Processed food has little nutritional value, so try to avoid this.        Medications  Resume taking all of your medications unless your doctor or physician assistant tells you not to. If your incision is causing pain, you may take over-the- counter pain relievers such as acetaminophen (Tylenol). If you were prescribed a stronger pain medication, please be aware these medications can cause nausea and constipation. Prevent nausea by taking the medication with a snack or meal. Avoid constipation by drinking plenty of fluids and eating foods with a high amount of fiber, such as fruits, vegetables, and grains. Do not take Tylenol if you are taking prescription pain medications.  Follow Up  Our office will schedule a follow up appointment 2-3 weeks following discharge.  Please call us immediately for any of the following conditions  Increased pain, redness, drainage (pus) from your incision site. Fever of 101 degrees or higher. If you should develop stroke (slurred speech, difficulty swallowing, weakness on one side of your body, loss of vision) you should call 911 and go to the nearest emergency room.  Reduce your risk of vascular disease:  Stop smoking. If you would like help call [facility] at 1-800-QUIT-NOW (1-800-784-8669) or [place] at 336-586-4000. Manage your cholesterol Maintain a desired weight Control your diabetes Keep your blood pressure down  If you have any questions, please call the office at [phone].   

## 2019-11-29 NOTE — Progress Notes (Signed)
Pt got discharged to home, discharge instructions provided and patient showed understanding to it, IV taken out,Telemonitor DC,pt left unit in wheelchair with all of the belongings accompanied with a family member (friend)  Melinda Payne

## 2019-11-29 NOTE — Plan of Care (Signed)
  Problem: Education: Goal: Knowledge of General Education information will improve Description: Including pain rating scale, medication(s)/side effects and non-pharmacologic comfort measures Outcome: Adequate for Discharge   Problem: Health Behavior/Discharge Planning: Goal: Ability to manage health-related needs will improve Outcome: Adequate for Discharge   Problem: Clinical Measurements: Goal: Ability to maintain clinical measurements within normal limits will improve Outcome: Adequate for Discharge Goal: Will remain free from infection Outcome: Adequate for Discharge Goal: Diagnostic test results will improve Outcome: Adequate for Discharge Goal: Respiratory complications will improve Outcome: Adequate for Discharge Goal: Cardiovascular complication will be avoided Outcome: Adequate for Discharge   Problem: Coping: Goal: Level of anxiety will decrease Outcome: Adequate for Discharge   Problem: Elimination: Goal: Will not experience complications related to bowel motility Outcome: Adequate for Discharge Goal: Will not experience complications related to urinary retention Outcome: Adequate for Discharge   Problem: Pain Managment: Goal: General experience of comfort will improve Outcome: Adequate for Discharge   Problem: Safety: Goal: Ability to remain free from injury will improve Outcome: Adequate for Discharge   Problem: Skin Integrity: Goal: Risk for impaired skin integrity will decrease Outcome: Adequate for Discharge   Problem: Education: Goal: Understanding of CV disease, CV risk reduction, and recovery process will improve Outcome: Adequate for Discharge Goal: Individualized Educational Video(s) Outcome: Adequate for Discharge   Problem: Activity: Goal: Ability to return to baseline activity level will improve Outcome: Adequate for Discharge   Problem: Cardiovascular: Goal: Ability to achieve and maintain adequate cardiovascular perfusion will  improve Outcome: Adequate for Discharge Goal: Vascular access site(s) Level 0-1 will be maintained Outcome: Adequate for Discharge   Problem: Health Behavior/Discharge Planning: Goal: Ability to safely manage health-related needs after discharge will improve Outcome: Adequate for Discharge

## 2019-11-29 NOTE — Discharge Summary (Signed)
Carotid Discharge Summary     Melinda Payne Jan 09, 1941 79 y.o. female  [id number]  Admission Date: 11/28/2019  Discharge Date: 11/29/19  Physician: Rosetta Posner, MD  Admission Diagnosis: Carotid artery stenosis [I65.29]  Discharge Day services:      Hospital Course:  The patient was admitted to the hospital and taken to the operating room on 11/28/2019 and underwent right carotid endarterectomy.  The pt tolerated the procedure well and was transported to the PACU in excellent condition.   By POD 1, the pt neuro status remained intact.  She remained hemodynamically stable.  Her incision was well approximated without hematoma.  She was tolerating her diet without dysphagia.  Pain was well controlled.  The remainder of the hospital course consisted of increasing mobilization and increasing intake of solids without difficulty.   Recent Labs    11/26/19 1000 11/29/19 0450  NA 136 139  K 3.4* 3.9  CL 99 109  CO2 26 25  GLUCOSE 96 151*  BUN 16 15  CALCIUM 9.7 8.6*   Recent Labs    11/26/19 1000 11/29/19 0450  WBC 6.2 11.5*  HGB 13.4 12.1  HCT 40.5 34.8*  PLT 173 178   Recent Labs    11/26/19 1000  INR 1.0     Discharge Instructions    Discharge patient   Complete by: As directed    Discharge disposition: 01-Home or Self Care   Discharge patient date: 11/29/2019      Discharge Diagnosis:  Carotid artery stenosis [I65.29]  Secondary Diagnosis: Patient Active Problem List   Diagnosis Date Noted  . Carotid artery stenosis 11/28/2019  . Endometrial carcinoma ([place]) 08/16/2016  . Chronic kidney disease 02/02/2016  . Osteopenia 07/01/2015  . Hyperlipidemia 06/20/2013  . Right carotid bruit 07/04/2011  . Hypothyroidism 01/09/2011  . Hypertension 01/04/2011  . Spinal stenosis of lumbar region 01/04/2011  . Vitamin D deficiency 01/04/2011  . History of malignant melanoma of skin 01/04/2011  . Anxiety 01/04/2011  . Insomnia 01/04/2011  .  Fibrocystic breast disease 01/04/2011  . History of endometrial cancer 01/04/2011   Past Medical History:  Diagnosis Date  . Anxiety   . Arthritis    back  . Cancer ([place]) 1997   melanoma rt neck  . Carotid artery stenosis   . Chronic kidney disease    stage renal disease  . Counseling for estrogen replacement therapy   . Fibrocystic breast disease   . Hypertension   . Hypothyroidism   . Insomnia   . Spinal stenosis   . Vitamin D deficiency     Allergies as of 11/29/2019      Reactions   Penicillins Hives   Codeine Hives      Medication List    TAKE these medications   ALPRAZolam 1 MG tablet Commonly known as: XANAX TAKE ONE TABLET BY MOUTH EVERY NIGHT AT BEDTIME AS NEEDED FOR INSOMNIA What changed: See the new instructions.   ALPRAZolam 0.25 MG tablet Commonly known as: XANAX TAKE 1 TABLET BY MOUTH THREE TIMES A DAY What changed:   when to take this  additional instructions   ascorbic acid 500 MG tablet Commonly known as: VITAMIN C Take 500 mg by mouth daily.   aspirin EC 81 MG tablet Take 81 mg by mouth daily. Swallow whole.   donepezil 5 MG tablet Commonly known as: ARICEPT TAKE ONE TABLET BY MOUTH AT BEDTIME   hydrochlorothiazide 25 MG tablet Commonly known as: HYDRODIURIL TAKE ONE TABLET BY MOUTH  DAILY   HYDROcodone-acetaminophen 5-325 MG tablet Commonly known as: NORCO/VICODIN Take 1 tablet by mouth every 4 (four) hours as needed for moderate pain.   levothyroxine 75 MCG tablet Commonly known as: SYNTHROID TAKE ONE TABLET BY MOUTH DAILY What changed: when to take this   multivitamin with minerals tablet Take 1 tablet by mouth daily.   OMEGA 3 PO Take 2 capsules by mouth daily.   simvastatin 10 MG tablet Commonly known as: ZOCOR TAKE ONE TABLET BY MOUTH DAILY AT 6PM What changed: See the new instructions.   vitamin B-12 100 MCG tablet Commonly known as: CYANOCOBALAMIN Take 100 mcg by mouth daily.   Vitamin D-3 25 MCG (1000 UT)  Caps Take 1,000 Units by mouth daily.        Discharge Instructions:   [facility] Discharge Instructions Carotid Endarterectomy (CEA)  Please refer to the following instructions for your post-procedure care. Your surgeon or physician assistant will discuss any changes with you.  Activity  You are encouraged to walk as much as you can. You can slowly return to normal activities but must avoid strenuous activity and heavy lifting until your doctor tell you it's OK. Avoid activities such as vacuuming or swinging a golf club. You can drive after one week if you are comfortable and you are no longer taking prescription pain medications. It is normal to feel tired for serval weeks after your surgery. It is also normal to have difficulty with sleep habits, eating, and bowel movements after surgery. These will go away with time.  Bathing/Showering  You may shower after you come home. Do not soak in a bathtub, hot tub, or swim until the incision heals completely.  Incision Care  Shower every day. Clean your incision with mild soap and water. Pat the area dry with a clean towel. You do not need a bandage unless otherwise instructed. Do not apply any ointments or creams to your incision. You may have skin glue on your incision. Do not peel it off. It will come off on its own in about one week. Your incision may feel thickened and raised for several weeks after your surgery. This is normal and the skin will soften over time. For Men Only: It's OK to shave around the incision but do not shave the incision itself for 2 weeks. It is common to have numbness under your chin that could last for several months.  Diet  Resume your normal diet. There are no special food restrictions following this procedure. A low fat/low cholesterol diet is recommended for all patients with vascular disease. In order to heal from your surgery, it is CRITICAL to get adequate nutrition. Your  body requires vitamins, minerals, and protein. Vegetables are the best source of vitamins and minerals. Vegetables also provide the perfect balance of protein. Processed food has little nutritional value, so try to avoid this.  Medications  Resume taking all of your medications unless your doctor or physician assistant tells you not to.  If your incision is causing pain, you may take over-the- counter pain relievers such as acetaminophen (Tylenol). If you were prescribed a stronger pain medication, please be aware these medications can cause nausea and constipation.  Prevent nausea by taking the medication with a snack or meal. Avoid constipation by drinking plenty of fluids and eating foods with a high amount of fiber, such as fruits, vegetables, and grains. Do not take Tylenol if you are taking prescription pain medications.  Follow Up  Our office will schedule a follow up appointment 2-3 weeks following discharge.  Please call us immediately for any of the following conditions  . Increased pain, redness, drainage (pus) from your incision site. . Fever of 101 degrees or higher. . If you should develop stroke (slurred speech, difficulty swallowing, weakness on one side of your body, loss of vision) you should call 911 and go to the nearest emergency room. .  Reduce your risk of vascular disease:  . Stop smoking. If you would like help call [facility] at 1-800-QUIT-NOW 478-194-7587) or [place] at (743)412-8255. . Manage your cholesterol . Maintain a desired weight . Control your diabetes . Keep your blood pressure down .  If you have any questions, please call the office at [phone].  Prescriptions given: Norco #20 No Refill  Disposition: Home  Patient's condition: is Excellent  Follow up: 1. Dr. Donnetta Hutching in 2 weeks.   Barbie Banner PA-C [facility] [phone]   --- For [facility] Registry use ---   Modified Rankin score at D/C (0-6): 0  IV  medication needed for:  1. Hypertension: No 2. Hypotension: No  Post-op Complications: No  1. Post-op CVA or TIA: No  If yes: Event classification (right eye, left eye, right cortical, left cortical, verterobasilar, other): Not applicable  If yes: Timing of event (intra-op, <6 hrs post-op, >=6 hrs post-op, unknown): Not applicable  2. CN injury: No  If yes: CN  injuried   3. Myocardial infarction: No  If yes: Dx by (EKG or clinical, Troponin):   4.  CHF: No  5.  Dysrhythmia (new): No  6. Wound infection: No  7. Reperfusion symptoms: No  8. Return to OR: No  If yes: return to OR for (bleeding, neurologic, other CEA incision, other):   Discharge medications: Statin use:  Yes ASA use:  Yes   Beta blocker use:  No ACE-Inhibitor use:  No  ARB use:  No CCB use: No P2Y12 Antagonist use: No, [ ]  Plavix, [ ]  Plasugrel, [ ]  Ticlopinine, [ ]  Ticagrelor, [ ]  Other, [ ]  No for medical reason, [ ]  Non-compliant, [ ]  Not-indicated Anti-coagulant use:  No, [ ]  Warfarin, [ ]  Rivaroxaban, [ ]  Dabigatran,

## 2019-12-01 ENCOUNTER — Encounter (HOSPITAL_COMMUNITY): Payer: Self-pay | Admitting: Vascular Surgery

## 2019-12-04 ENCOUNTER — Telehealth: Payer: Self-pay

## 2019-12-04 NOTE — Telephone Encounter (Signed)
Patient called to ask if she could visit a nursing home 2 weeks after CEA surgery. She verbalized understanding that if her incision is well-healing and clean and dry that should be fine. Currently denies any problems or further questions.

## 2019-12-10 ENCOUNTER — Other Ambulatory Visit: Payer: Self-pay | Admitting: Internal Medicine

## 2019-12-15 ENCOUNTER — Other Ambulatory Visit: Payer: Self-pay

## 2019-12-15 ENCOUNTER — Telehealth: Payer: Self-pay | Admitting: Internal Medicine

## 2019-12-15 ENCOUNTER — Encounter: Payer: Self-pay | Admitting: Vascular Surgery

## 2019-12-15 ENCOUNTER — Ambulatory Visit (INDEPENDENT_AMBULATORY_CARE_PROVIDER_SITE_OTHER): Payer: Self-pay | Admitting: Vascular Surgery

## 2019-12-15 VITALS — BP 150/72 | HR 80 | Temp 98.4°F | Resp 12 | Ht 62.5 in | Wt 153.0 lb

## 2019-12-15 DIAGNOSIS — I6529 Occlusion and stenosis of unspecified carotid artery: Secondary | ICD-10-CM

## 2019-12-15 NOTE — Telephone Encounter (Signed)
LVM to CB  We received fax from [facility] to call patient and verify dosage of below medication. They wanted [place] to reach out and discuss the importance of compliance of this medication with patient.  Pharmacy -- Post-Discharge review   donepezil (ARICEPT) 5 MG tablet

## 2019-12-15 NOTE — Progress Notes (Signed)
   Patient name: Melinda Payne MRN: [id number] DOB: Mar 18, 1940 Sex: female  REASON FOR VISIT: Follow-up recent right carotid endarterectomy for severe asymptomatic disease  HPI: Melinda Payne is a 79 y.o. female status post right carotid endarterectomy on 11/28/2019.  She did well with surgery and was discharged home on postoperative day #1.  She continues to do well.  She has had no postoperative pain.  She has had no neurologic deficits.  She does report she has noticed some fatigue following surgery.  Current Outpatient Medications  Medication Sig Dispense Refill  . ALPRAZolam (XANAX) 0.25 MG tablet TAKE 1 TABLET BY MOUTH THREE TIMES A DAY (Patient taking differently: Take 0.25 mg by mouth See admin instructions. Take 1 tablet (0.25 mg) by mouth scheduled every morning, may repeat dose twice daily if needed for anxiety.) 90 tablet 2  . ALPRAZolam (XANAX) 1 MG tablet TAKE ONE TABLET BY MOUTH EVERY NIGHT AT BEDTIME AS NEEDED FOR INSOMNIA (Patient taking differently: Take 1 mg by mouth at bedtime. ) 90 tablet 1  . ascorbic acid (VITAMIN C) 500 MG tablet Take 500 mg by mouth daily.    Marland Kitchen aspirin EC 81 MG tablet Take 81 mg by mouth daily. Swallow whole.    . Cholecalciferol (VITAMIN D-3) 1000 units CAPS Take 1,000 Units by mouth daily.     Marland Kitchen donepezil (ARICEPT) 5 MG tablet TAKE ONE TABLET BY MOUTH AT BEDTIME (Patient taking differently: Take 5 mg by mouth at bedtime. ) 90 tablet 2  . hydrochlorothiazide (HYDRODIURIL) 25 MG tablet TAKE ONE TABLET BY MOUTH DAILY (Patient taking differently: Take 25 mg by mouth daily. ) 90 tablet 1  . levothyroxine (SYNTHROID) 75 MCG tablet TAKE ONE TABLET BY MOUTH DAILY (Patient taking differently: Take 75 mcg by mouth daily before breakfast. ) 90 tablet 0  . Multiple Vitamins-Minerals (MULTIVITAMIN WITH MINERALS) tablet Take 1 tablet by mouth daily.      . Omega-3 Fatty Acids (OMEGA 3 PO) Take 2 capsules by mouth daily.    .  simvastatin (ZOCOR) 10 MG tablet Take 1 tablet (10 mg total) by mouth daily. 90 tablet 3  . vitamin B-12 (CYANOCOBALAMIN) 100 MCG tablet Take 100 mcg by mouth daily.      No current facility-administered medications for this visit.     PHYSICAL EXAM: Vitals:   12/15/19 1301  BP: (!) 150/72  Pulse: 80  Resp: 12  Temp: 98.4 F (36.9 C)  TempSrc: Other (Comment)  SpO2: 99%  Weight: 153 lb (69.4 kg)  Height: 5' 2.5" (1.588 m)    GENERAL: The patient is a well-nourished female, in no acute distress. The vital signs are documented above. Right neck incision is well-healed.  No bruits bilaterally.  MEDICAL ISSUES: Stable status post right carotid endarterectomy for severe asymptomatic disease.  Will resume full activities.  We will see her again in our office in 9 months with repeat carotid duplex.  She will notify us should she develop any wound issues or neurologic changes.   Rosetta Posner, MD FACS [facility] Tel [phone] Pager [phone]

## 2019-12-16 DIAGNOSIS — R69 Illness, unspecified: Secondary | ICD-10-CM | POA: Diagnosis not present

## 2019-12-16 NOTE — Telephone Encounter (Signed)
Patient called back and she is taking her medication 1 every night.

## 2019-12-24 ENCOUNTER — Telehealth: Payer: Self-pay

## 2019-12-24 DIAGNOSIS — S8265XA Nondisplaced fracture of lateral malleolus of left fibula, initial encounter for closed fracture: Secondary | ICD-10-CM | POA: Diagnosis not present

## 2019-12-24 NOTE — Telephone Encounter (Signed)
Patient called to report a fall yesterday. She was concerned it would affect her carotid artery surgery from Sept. Denied any loss of consciousness, neuro symptoms, or pain. Told her it would not affect the surgery, but to report to the ED if she experienced any neuro symptoms such as dizziness, decreased LOC, slurred speech, etc. Patient verbalized understanding.

## 2019-12-30 ENCOUNTER — Other Ambulatory Visit: Payer: Self-pay | Admitting: Internal Medicine

## 2019-12-31 DIAGNOSIS — S8265XD Nondisplaced fracture of lateral malleolus of left fibula, subsequent encounter for closed fracture with routine healing: Secondary | ICD-10-CM | POA: Diagnosis not present

## 2020-01-14 DIAGNOSIS — S8265XD Nondisplaced fracture of lateral malleolus of left fibula, subsequent encounter for closed fracture with routine healing: Secondary | ICD-10-CM | POA: Diagnosis not present

## 2020-01-26 ENCOUNTER — Other Ambulatory Visit: Payer: Self-pay | Admitting: Internal Medicine

## 2020-02-04 DIAGNOSIS — S8265XD Nondisplaced fracture of lateral malleolus of left fibula, subsequent encounter for closed fracture with routine healing: Secondary | ICD-10-CM | POA: Diagnosis not present

## 2020-02-09 ENCOUNTER — Other Ambulatory Visit: Payer: Medicare HMO | Admitting: Internal Medicine

## 2020-02-09 ENCOUNTER — Other Ambulatory Visit: Payer: Self-pay

## 2020-02-09 DIAGNOSIS — E7800 Pure hypercholesterolemia, unspecified: Secondary | ICD-10-CM | POA: Diagnosis not present

## 2020-02-09 DIAGNOSIS — E039 Hypothyroidism, unspecified: Secondary | ICD-10-CM | POA: Diagnosis not present

## 2020-02-09 DIAGNOSIS — G4700 Insomnia, unspecified: Secondary | ICD-10-CM

## 2020-02-09 DIAGNOSIS — N1831 Chronic kidney disease, stage 3a: Secondary | ICD-10-CM | POA: Diagnosis not present

## 2020-02-09 DIAGNOSIS — F439 Reaction to severe stress, unspecified: Secondary | ICD-10-CM

## 2020-02-09 DIAGNOSIS — I6522 Occlusion and stenosis of left carotid artery: Secondary | ICD-10-CM | POA: Diagnosis not present

## 2020-02-09 DIAGNOSIS — R0989 Other specified symptoms and signs involving the circulatory and respiratory systems: Secondary | ICD-10-CM | POA: Diagnosis not present

## 2020-02-09 DIAGNOSIS — F411 Generalized anxiety disorder: Secondary | ICD-10-CM

## 2020-02-09 DIAGNOSIS — M8580 Other specified disorders of bone density and structure, unspecified site: Secondary | ICD-10-CM

## 2020-02-09 DIAGNOSIS — N183 Chronic kidney disease, stage 3 unspecified: Secondary | ICD-10-CM

## 2020-02-09 DIAGNOSIS — I10 Essential (primary) hypertension: Secondary | ICD-10-CM | POA: Diagnosis not present

## 2020-02-09 DIAGNOSIS — R69 Illness, unspecified: Secondary | ICD-10-CM | POA: Diagnosis not present

## 2020-02-09 DIAGNOSIS — Z0000 Encounter for general adult medical examination without abnormal findings: Secondary | ICD-10-CM

## 2020-02-10 LAB — LIPID PANEL
Cholesterol: 202 mg/dL — ABNORMAL HIGH (ref <200)
HDL: 93 mg/dL (ref >=50)
LDL Cholesterol (Calc): 94 mg/dL (calc)
Non-HDL Cholesterol (Calc): 109 mg/dL (calc) (ref <130)
Total CHOL/HDL Ratio: 2.2 (calc) (ref <5.0)
Triglycerides: 63 mg/dL (ref <150)

## 2020-02-10 LAB — COMPLETE METABOLIC PANEL WITH GFR
AG Ratio: 1.7 (calc) (ref 1.0–2.5)
ALT: 9 U/L (ref 6–29)
AST: 14 U/L (ref 10–35)
Albumin: 4.3 g/dL (ref 3.6–5.1)
Alkaline phosphatase (APISO): 72 U/L (ref 37–153)
BUN/Creatinine Ratio: 19 (calc) (ref 6–22)
BUN: 21 mg/dL (ref 7–25)
CO2: 35 mmol/L — ABNORMAL HIGH (ref 20–32)
Calcium: 10.4 mg/dL (ref 8.6–10.4)
Chloride: 101 mmol/L (ref 98–110)
Creat: 1.11 mg/dL — ABNORMAL HIGH (ref 0.60–0.93)
GFR, Est African American: 55 mL/min/{1.73_m2} — ABNORMAL LOW (ref >=60)
GFR, Est Non African American: 48 mL/min/{1.73_m2} — ABNORMAL LOW (ref >=60)
Globulin: 2.6 g/dL (calc) (ref 1.9–3.7)
Glucose, Bld: 85 mg/dL (ref 65–99)
Potassium: 4.6 mmol/L (ref 3.5–5.3)
Sodium: 142 mmol/L (ref 135–146)
Total Bilirubin: 0.8 mg/dL (ref 0.2–1.2)
Total Protein: 6.9 g/dL (ref 6.1–8.1)

## 2020-02-10 LAB — CBC WITH DIFFERENTIAL/PLATELET
Absolute Monocytes: 637 cells/uL (ref 200–950)
Basophils Absolute: 13 cells/uL (ref 0–200)
Basophils Relative: 0.2 %
Eosinophils Absolute: 98 cells/uL (ref 15–500)
Eosinophils Relative: 1.5 %
HCT: 38.9 % (ref 35.0–45.0)
Hemoglobin: 13.1 g/dL (ref 11.7–15.5)
Lymphs Abs: 2464 cells/uL (ref 850–3900)
MCH: 32.3 pg (ref 27.0–33.0)
MCHC: 33.7 g/dL (ref 32.0–36.0)
MCV: 95.8 fL (ref 80.0–100.0)
MPV: 10.3 fL (ref 7.5–12.5)
Monocytes Relative: 9.8 %
Neutro Abs: 3289 cells/uL (ref 1500–7800)
Neutrophils Relative %: 50.6 %
Platelets: 177 10*3/uL (ref 140–400)
RBC: 4.06 10*6/uL (ref 3.80–5.10)
RDW: 11.3 % (ref 11.0–15.0)
Total Lymphocyte: 37.9 %
WBC: 6.5 10*3/uL (ref 3.8–10.8)

## 2020-02-10 LAB — TSH: TSH: 2.06 mIU/L (ref 0.40–4.50)

## 2020-02-12 ENCOUNTER — Ambulatory Visit (INDEPENDENT_AMBULATORY_CARE_PROVIDER_SITE_OTHER): Payer: Medicare HMO | Admitting: Internal Medicine

## 2020-02-12 ENCOUNTER — Other Ambulatory Visit: Payer: Self-pay

## 2020-02-12 VITALS — BP 140/80 | HR 83 | Ht 60.5 in | Wt 154.0 lb

## 2020-02-12 DIAGNOSIS — Z0000 Encounter for general adult medical examination without abnormal findings: Secondary | ICD-10-CM | POA: Diagnosis not present

## 2020-02-12 DIAGNOSIS — N1831 Chronic kidney disease, stage 3a: Secondary | ICD-10-CM | POA: Diagnosis not present

## 2020-02-12 DIAGNOSIS — G4700 Insomnia, unspecified: Secondary | ICD-10-CM | POA: Diagnosis not present

## 2020-02-12 DIAGNOSIS — E7800 Pure hypercholesterolemia, unspecified: Secondary | ICD-10-CM

## 2020-02-12 DIAGNOSIS — Z85820 Personal history of malignant melanoma of skin: Secondary | ICD-10-CM

## 2020-02-12 DIAGNOSIS — R69 Illness, unspecified: Secondary | ICD-10-CM | POA: Diagnosis not present

## 2020-02-12 DIAGNOSIS — Z818 Family history of other mental and behavioral disorders: Secondary | ICD-10-CM

## 2020-02-12 DIAGNOSIS — E039 Hypothyroidism, unspecified: Secondary | ICD-10-CM

## 2020-02-12 DIAGNOSIS — Z8542 Personal history of malignant neoplasm of other parts of uterus: Secondary | ICD-10-CM | POA: Diagnosis not present

## 2020-02-12 DIAGNOSIS — I10 Essential (primary) hypertension: Secondary | ICD-10-CM | POA: Diagnosis not present

## 2020-02-12 DIAGNOSIS — H903 Sensorineural hearing loss, bilateral: Secondary | ICD-10-CM | POA: Diagnosis not present

## 2020-02-12 LAB — POCT URINALYSIS DIPSTICK
Appearance: NEGATIVE
Bilirubin, UA: NEGATIVE
Blood, UA: NEGATIVE
Glucose, UA: NEGATIVE
Ketones, UA: NEGATIVE
Leukocytes, UA: NEGATIVE
Nitrite, UA: NEGATIVE
Odor: NEGATIVE
Protein, UA: NEGATIVE
Spec Grav, UA: 1.01 (ref 1.010–1.025)
Urobilinogen, UA: 0.2 E.U./dL
pH, UA: 6.5 (ref 5.0–8.0)
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
## 2020-02-12 NOTE — Patient Instructions (Addendum)
It was a pleasure to see you today. Continue same meds and RTC in 6 months.

## 2020-03-02 ENCOUNTER — Telehealth: Payer: Self-pay | Admitting: Internal Medicine

## 2020-03-02 DIAGNOSIS — Z1231 Encounter for screening mammogram for malignant neoplasm of breast: Secondary | ICD-10-CM | POA: Diagnosis not present

## 2020-03-02 LAB — HM MAMMOGRAPHY
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
## 2020-03-02 NOTE — Telephone Encounter (Signed)
Scheduled

## 2020-03-02 NOTE — Telephone Encounter (Addendum)
[name] [id number]  [name] called to say a week ago she started having hoarseness, stuffy nose and phlegm or mucas that just will not come up. She feels okay. Has been taking quaifinsin does not seem to be helping.

## 2020-03-03 ENCOUNTER — Encounter: Payer: Self-pay | Admitting: Internal Medicine

## 2020-03-03 ENCOUNTER — Ambulatory Visit (INDEPENDENT_AMBULATORY_CARE_PROVIDER_SITE_OTHER): Payer: Medicare HMO | Admitting: Internal Medicine

## 2020-03-03 ENCOUNTER — Other Ambulatory Visit: Payer: Self-pay | Admitting: Internal Medicine

## 2020-03-03 ENCOUNTER — Other Ambulatory Visit: Payer: Self-pay

## 2020-03-03 VITALS — HR 88 | Temp 97.7°F

## 2020-03-03 DIAGNOSIS — H903 Sensorineural hearing loss, bilateral: Secondary | ICD-10-CM

## 2020-03-03 DIAGNOSIS — E7800 Pure hypercholesterolemia, unspecified: Secondary | ICD-10-CM

## 2020-03-03 DIAGNOSIS — Z9889 Other specified postprocedural states: Secondary | ICD-10-CM | POA: Diagnosis not present

## 2020-03-03 DIAGNOSIS — J22 Unspecified acute lower respiratory infection: Secondary | ICD-10-CM | POA: Diagnosis not present

## 2020-03-03 DIAGNOSIS — E039 Hypothyroidism, unspecified: Secondary | ICD-10-CM

## 2020-03-03 DIAGNOSIS — N1831 Chronic kidney disease, stage 3a: Secondary | ICD-10-CM

## 2020-03-03 DIAGNOSIS — R0981 Nasal congestion: Secondary | ICD-10-CM | POA: Diagnosis not present

## 2020-03-03 DIAGNOSIS — I10 Essential (primary) hypertension: Secondary | ICD-10-CM

## 2020-03-03 DIAGNOSIS — Z85820 Personal history of malignant melanoma of skin: Secondary | ICD-10-CM | POA: Diagnosis not present

## 2020-03-03 DIAGNOSIS — Z8542 Personal history of malignant neoplasm of other parts of uterus: Secondary | ICD-10-CM

## 2020-03-03 DIAGNOSIS — F411 Generalized anxiety disorder: Secondary | ICD-10-CM

## 2020-03-03 DIAGNOSIS — Z818 Family history of other mental and behavioral disorders: Secondary | ICD-10-CM

## 2020-03-03 MED ORDER — AZITHROMYCIN 250 MG PO TABS: ORAL_TABLET | ORAL | 0 refills | Status: DC
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
## 2020-03-03 NOTE — Patient Instructions (Addendum)
Quarantine at home until test result is back for COVID-19. Rest and drink plenty of fluids. Watch pulse oximetry. Watch for shortness of breath. Call if symptoms worsen. Take Zithromax Z-PAK 2 p.o. day one followed by one p.o. days 2 through 5.

## 2020-03-04 ENCOUNTER — Encounter: Payer: Self-pay | Admitting: Internal Medicine

## 2020-03-05 LAB — SARS-COV-2 RNA,(COVID-19) QUALITATIVE NAAT: SARS CoV2 RNA: NOT DETECTED

## 2020-03-17 ENCOUNTER — Other Ambulatory Visit: Payer: Self-pay | Admitting: Internal Medicine

## 2020-04-01 ENCOUNTER — Other Ambulatory Visit: Payer: Self-pay | Admitting: Internal Medicine

## 2020-04-26 ENCOUNTER — Other Ambulatory Visit: Payer: Self-pay | Admitting: Internal Medicine

## 2020-04-26 DIAGNOSIS — D3132 Benign neoplasm of left choroid: Secondary | ICD-10-CM | POA: Diagnosis not present

## 2020-04-26 DIAGNOSIS — H5203 Hypermetropia, bilateral: Secondary | ICD-10-CM | POA: Diagnosis not present

## 2020-04-26 DIAGNOSIS — H18593 Other hereditary corneal dystrophies, bilateral: Secondary | ICD-10-CM | POA: Diagnosis not present

## 2020-04-26 DIAGNOSIS — H04123 Dry eye syndrome of bilateral lacrimal glands: Secondary | ICD-10-CM | POA: Diagnosis not present

## 2020-05-09 ENCOUNTER — Other Ambulatory Visit: Payer: Self-pay | Admitting: Internal Medicine

## 2020-06-03 DIAGNOSIS — H04551 Acquired stenosis of right nasolacrimal duct: Secondary | ICD-10-CM | POA: Diagnosis not present

## 2020-06-03 DIAGNOSIS — H04221 Epiphora due to insufficient drainage, right lacrimal gland: Secondary | ICD-10-CM | POA: Diagnosis not present

## 2020-06-03 DIAGNOSIS — N289 Disorder of kidney and ureter, unspecified: Secondary | ICD-10-CM | POA: Insufficient documentation

## 2020-06-03 DIAGNOSIS — H04122 Dry eye syndrome of left lacrimal gland: Secondary | ICD-10-CM | POA: Diagnosis not present

## 2020-06-29 ENCOUNTER — Telehealth: Payer: Self-pay | Admitting: Internal Medicine

## 2020-06-29 NOTE — Telephone Encounter (Signed)
Schedule OV for clearance

## 2020-06-29 NOTE — Telephone Encounter (Signed)
Called to inquire what type of surgery it is where they put a silicone tube in tear duct and it is  done under general anesthesia. The patient goes home same day.  Surgery is scheduled for  07/14/2020

## 2020-06-29 NOTE — Telephone Encounter (Signed)
LVM to CB and schedule OV to discuss surgery clearance

## 2020-06-29 NOTE — Telephone Encounter (Signed)
Received form to fill out for Surgery Clearance for patient to have Tight external DCR, right silicone tube intubation, with mitomycin C.

## 2020-06-30 NOTE — Telephone Encounter (Signed)
Scheduled

## 2020-07-06 ENCOUNTER — Other Ambulatory Visit: Payer: Self-pay

## 2020-07-06 ENCOUNTER — Ambulatory Visit (INDEPENDENT_AMBULATORY_CARE_PROVIDER_SITE_OTHER): Payer: Medicare HMO | Admitting: Internal Medicine

## 2020-07-06 ENCOUNTER — Encounter: Payer: Self-pay | Admitting: Internal Medicine

## 2020-07-06 VITALS — BP 120/60 | HR 65 | Temp 98.8°F | Ht 60.5 in | Wt 151.0 lb

## 2020-07-06 DIAGNOSIS — I10 Essential (primary) hypertension: Secondary | ICD-10-CM | POA: Diagnosis not present

## 2020-07-06 DIAGNOSIS — E785 Hyperlipidemia, unspecified: Secondary | ICD-10-CM | POA: Diagnosis not present

## 2020-07-06 DIAGNOSIS — Z7901 Long term (current) use of anticoagulants: Secondary | ICD-10-CM | POA: Diagnosis not present

## 2020-07-06 DIAGNOSIS — Z01818 Encounter for other preprocedural examination: Secondary | ICD-10-CM
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
## 2020-07-07 ENCOUNTER — Telehealth: Payer: Self-pay | Admitting: Internal Medicine

## 2020-07-07 LAB — COMPLETE METABOLIC PANEL WITH GFR
AG Ratio: 2 (calc) (ref 1.0–2.5)
ALT: 14 U/L (ref 6–29)
AST: 22 U/L (ref 10–35)
Albumin: 4.6 g/dL (ref 3.6–5.1)
Alkaline phosphatase (APISO): 59 U/L (ref 37–153)
BUN/Creatinine Ratio: 19 (calc) (ref 6–22)
BUN: 25 mg/dL (ref 7–25)
CO2: 32 mmol/L (ref 20–32)
Calcium: 9.8 mg/dL (ref 8.6–10.4)
Chloride: 100 mmol/L (ref 98–110)
Creat: 1.33 mg/dL — ABNORMAL HIGH (ref 0.60–0.93)
GFR, Est African American: 45 mL/min/{1.73_m2} — ABNORMAL LOW (ref >=60)
GFR, Est Non African American: 38 mL/min/{1.73_m2} — ABNORMAL LOW (ref >=60)
Globulin: 2.3 g/dL (calc) (ref 1.9–3.7)
Glucose, Bld: 87 mg/dL (ref 65–99)
Potassium: 4.2 mmol/L (ref 3.5–5.3)
Sodium: 141 mmol/L (ref 135–146)
Total Bilirubin: 0.8 mg/dL (ref 0.2–1.2)
Total Protein: 6.9 g/dL (ref 6.1–8.1)

## 2020-07-07 LAB — CBC WITH DIFFERENTIAL/PLATELET
Absolute Monocytes: 718 cells/uL (ref 200–950)
Basophils Absolute: 13 cells/uL (ref 0–200)
Basophils Relative: 0.2 %
Eosinophils Absolute: 32 cells/uL (ref 15–500)
Eosinophils Relative: 0.5 %
HCT: 38.6 % (ref 35.0–45.0)
Hemoglobin: 13.1 g/dL (ref 11.7–15.5)
Lymphs Abs: 2715 cells/uL (ref 850–3900)
MCH: 33 pg (ref 27.0–33.0)
MCHC: 33.9 g/dL (ref 32.0–36.0)
MCV: 97.2 fL (ref 80.0–100.0)
MPV: 10.5 fL (ref 7.5–12.5)
Monocytes Relative: 11.4 %
Neutro Abs: 2822 cells/uL (ref 1500–7800)
Neutrophils Relative %: 44.8 %
Platelets: 165 10*3/uL (ref 140–400)
RBC: 3.97 10*6/uL (ref 3.80–5.10)
RDW: 11.5 % (ref 11.0–15.0)
Total Lymphocyte: 43.1 %
WBC: 6.3 10*3/uL (ref 3.8–10.8)

## 2020-07-07 LAB — PROTIME-INR
INR: 0.9
Prothrombin Time: 9.6 s (ref 9.0–11.5)

## 2020-07-07 LAB — APTT: aPTT: 27 s (ref 23–32)

## 2020-07-07 NOTE — Telephone Encounter (Signed)
Faxed Surgery Clearance to [facility] [employer] [phone], phone [phone]  17 pages, Surgery Clearance, Demographics, Medication List, H&P, EKG, Office Notes, Lab Results

## 2020-07-07 NOTE — Patient Instructions (Addendum)
Labs are stable.  Stop aspirin at least 5 days before procedure and do not start again until at least 5 days after procedure.  EKG is stable from previous EKG. this information has been faxed to Dr. Nadara Mustard [phone]

## 2020-07-24 ENCOUNTER — Other Ambulatory Visit: Payer: Self-pay | Admitting: Internal Medicine

## 2020-07-29 ENCOUNTER — Other Ambulatory Visit: Payer: Self-pay | Admitting: *Deleted

## 2020-07-29 DIAGNOSIS — I6529 Occlusion and stenosis of unspecified carotid artery: Secondary | ICD-10-CM

## 2020-08-10 ENCOUNTER — Other Ambulatory Visit: Payer: Medicare HMO | Admitting: Internal Medicine

## 2020-08-10 ENCOUNTER — Other Ambulatory Visit: Payer: Self-pay

## 2020-08-10 DIAGNOSIS — E039 Hypothyroidism, unspecified: Secondary | ICD-10-CM

## 2020-08-10 DIAGNOSIS — E7800 Pure hypercholesterolemia, unspecified: Secondary | ICD-10-CM | POA: Diagnosis not present

## 2020-08-10 DIAGNOSIS — I10 Essential (primary) hypertension: Secondary | ICD-10-CM

## 2020-08-11 LAB — HEPATIC FUNCTION PANEL
AG Ratio: 1.9 (calc) (ref 1.0–2.5)
ALT: 11 U/L (ref 6–29)
AST: 18 U/L (ref 10–35)
Albumin: 4.3 g/dL (ref 3.6–5.1)
Alkaline phosphatase (APISO): 63 U/L (ref 37–153)
Bilirubin, Direct: 0.2 mg/dL (ref 0.0–0.2)
Globulin: 2.3 g/dL (calc) (ref 1.9–3.7)
Indirect Bilirubin: 0.6 mg/dL (calc) (ref 0.2–1.2)
Total Bilirubin: 0.8 mg/dL (ref 0.2–1.2)
Total Protein: 6.6 g/dL (ref 6.1–8.1)

## 2020-08-11 LAB — LIPID PANEL
Cholesterol: 182 mg/dL (ref <200)
HDL: 90 mg/dL (ref >=50)
LDL Cholesterol (Calc): 79 mg/dL (calc)
Non-HDL Cholesterol (Calc): 92 mg/dL (calc) (ref <130)
Total CHOL/HDL Ratio: 2 (calc) (ref <5.0)
Triglycerides: 47 mg/dL (ref <150)

## 2020-08-11 LAB — TSH: TSH: 3.48 mIU/L (ref 0.40–4.50)

## 2020-08-12 ENCOUNTER — Ambulatory Visit: Payer: Medicare HMO | Admitting: Internal Medicine

## 2020-08-19 ENCOUNTER — Encounter: Payer: Self-pay | Admitting: Internal Medicine

## 2020-08-19 ENCOUNTER — Other Ambulatory Visit: Payer: Self-pay

## 2020-08-19 ENCOUNTER — Ambulatory Visit (INDEPENDENT_AMBULATORY_CARE_PROVIDER_SITE_OTHER): Payer: Medicare HMO | Admitting: Internal Medicine

## 2020-08-19 VITALS — BP 120/80 | HR 71 | Ht 61.0 in | Wt 154.0 lb

## 2020-08-19 DIAGNOSIS — Z85820 Personal history of malignant melanoma of skin: Secondary | ICD-10-CM

## 2020-08-19 DIAGNOSIS — I10 Essential (primary) hypertension: Secondary | ICD-10-CM | POA: Diagnosis not present

## 2020-08-19 DIAGNOSIS — Z9889 Other specified postprocedural states: Secondary | ICD-10-CM

## 2020-08-19 DIAGNOSIS — R69 Illness, unspecified: Secondary | ICD-10-CM | POA: Diagnosis not present

## 2020-08-19 DIAGNOSIS — H903 Sensorineural hearing loss, bilateral: Secondary | ICD-10-CM

## 2020-08-19 DIAGNOSIS — N1831 Chronic kidney disease, stage 3a: Secondary | ICD-10-CM

## 2020-08-19 DIAGNOSIS — Z818 Family history of other mental and behavioral disorders: Secondary | ICD-10-CM

## 2020-08-19 DIAGNOSIS — E7800 Pure hypercholesterolemia, unspecified: Secondary | ICD-10-CM

## 2020-08-19 DIAGNOSIS — Z8542 Personal history of malignant neoplasm of other parts of uterus: Secondary | ICD-10-CM | POA: Diagnosis not present

## 2020-08-19 DIAGNOSIS — E039 Hypothyroidism, unspecified: Secondary | ICD-10-CM

## 2020-08-19 DIAGNOSIS — G4700 Insomnia, unspecified: Secondary | ICD-10-CM

## 2020-08-19 DIAGNOSIS — R0989 Other specified symptoms and signs involving the circulatory and respiratory systems: Secondary | ICD-10-CM

## 2020-08-19 DIAGNOSIS — F411 Generalized anxiety disorder: Secondary | ICD-10-CM
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
## 2020-08-20 ENCOUNTER — Other Ambulatory Visit: Payer: Medicare HMO | Admitting: *Deleted

## 2020-08-20 NOTE — Patient Outreach (Signed)
[facility]) Care Management  08/20/2020  Melinda Payne 06-16-1940 [id number]  Unsuccessful outreach attempt made to patient. RN Health Coach left HIPAA compliant voicemail message along with her contact information.  Plan: RN Health Coach will call patient within next several business days and will send an unsuccessful letter.   Emelia Loron RN, BSN [place] [phone] [email or web address]

## 2020-08-24 ENCOUNTER — Ambulatory Visit: Payer: Medicare HMO | Admitting: *Deleted

## 2020-08-26 ENCOUNTER — Other Ambulatory Visit: Payer: Self-pay | Admitting: *Deleted

## 2020-08-26 NOTE — Patient Outreach (Signed)
[facility]) Care Management  08/26/2020  Melinda Payne [birth year] [id number]  Successful telephone outreach call to patient for screening. HIPAA identifiers obtained. Nurse reviewed [facility] program and services with patient. Patient stated that she did not feel like the services would benefit her at this time. Patient did state that she would like a pamphlet to be mailed to her and gave permission for this nurse to call her every few months to inquire if she has any health and wellness needs.  Plan: RN Health Coach will send patient a [facility] Pamphlet.  Emelia Loron RN, BSN [place] [phone] [email or web address]

## 2020-08-27 ENCOUNTER — Ambulatory Visit: Payer: Self-pay | Admitting: *Deleted

## 2020-09-01 DIAGNOSIS — H04221 Epiphora due to insufficient drainage, right lacrimal gland: Secondary | ICD-10-CM | POA: Diagnosis not present

## 2020-09-01 DIAGNOSIS — H04551 Acquired stenosis of right nasolacrimal duct: Secondary | ICD-10-CM | POA: Diagnosis not present

## 2020-09-13 ENCOUNTER — Other Ambulatory Visit: Payer: Self-pay | Admitting: Internal Medicine

## 2020-09-14 ENCOUNTER — Ambulatory Visit (HOSPITAL_COMMUNITY)
Admission: RE | Admit: 2020-09-14 | Discharge: 2020-09-14 | Disposition: A | Discharge status: Home / Self Care | Payer: Medicare HMO | Source: Ambulatory Visit | Attending: Vascular Surgery | Admitting: Vascular Surgery

## 2020-09-14 ENCOUNTER — Other Ambulatory Visit: Payer: Self-pay

## 2020-09-14 ENCOUNTER — Ambulatory Visit: Payer: Medicare HMO | Admitting: Physician Assistant

## 2020-09-14 VITALS — BP 137/60 | HR 62 | Temp 97.7°F | Resp 20 | Ht 61.0 in | Wt 153.7 lb

## 2020-09-14 DIAGNOSIS — I6529 Occlusion and stenosis of unspecified carotid artery: Secondary | ICD-10-CM | POA: Diagnosis not present

## 2020-09-14 DIAGNOSIS — I6523 Occlusion and stenosis of bilateral carotid arteries: Secondary | ICD-10-CM

## 2020-09-14 NOTE — Progress Notes (Signed)
Carotid Artery Follow-Up   VASCULAR SURGERY ASSESSMENT & PLAN:   Melinda Payne is a 80 y.o. female s/p right carotid endarterectomy by Dr. Donnetta Hutching on 11/28/2019 for asymptomatic right ICA stenosis.  Bilateral carotid artery stenosis: The patient has no symptoms referable to carotid artery stenosis.  Duplex examination shows approximately 1-39% estimated stenosis bilaterally.  We reviewed the signs and symptoms of stroke/TIA and advised the patient to call EMS should these occur.   Continue optimal medical management of hypertension and follow-up with primary care physician. She has never used tobacco. Continue the following medications: aspirin and statin Follow-up in 1 year with carotid duplex ultrasound.  SUBJECTIVE:   The patient denies monocular blindness, slurred speech, facial drooping, extremity weakness or numbness.  PHYSICAL EXAM:   Vitals:   09/14/20 1126 09/14/20 1128  BP: (!) 148/69 137/60  Pulse: 62   Resp: 20   Temp: 97.7 F (36.5 C)   TempSrc: Temporal   SpO2: 100%   Weight: 153 lb 11.2 oz (69.7 kg)   Height: 5\' 1"  (1.549 m)     General appearance: Well-developed, well-nourished in no apparent distress Neurologic: Alert and oriented x4, tongue is midline, face symmetric, speech fluent, 5 out of 5 bilateral upper extremity grip strength, triceps and biceps strength Cardiovascular: Heart rate and rhythm are regular.  Pedal pulses are palpable.  No carotid bruits. Respirations: Nonlabored Abdomen: No palpable pulsatile mass   NON-INVASIVE VASCULAR STUDIES   09/14/2020 Right Carotid: Velocities in the right ICA are consistent with a 1-39%  stenosis.   Left Carotid: Velocities in the left ICA are consistent with a 1-39%  stenosis.   Vertebrals:  Bilateral vertebral arteries demonstrate antegrade flow.  Subclavians: Normal flow hemodynamics were seen in bilateral subclavian               arteries.   PROBLEM LIST:    The patient's past medical history,  past surgical history, family history, social history, allergy list and medication list are reviewed.   CURRENT MEDS:    Current Outpatient Medications:    ALPRAZolam (XANAX) 0.25 MG tablet, TAKE ONE TABLET BY MOUTH THREE TIMES A DAY, Disp: 90 tablet, Rfl: 3   ALPRAZolam (XANAX) 1 MG tablet, TAKE ONE TABLET BY MOUTH EVERY NIGHT AT BEDTIME AS NEEDED FOR INSOMNIA., Disp: 90 tablet, Rfl: 0   ascorbic acid (VITAMIN C) 500 MG tablet, Take 500 mg by mouth daily., Disp: , Rfl:    aspirin EC 81 MG tablet, Take 81 mg by mouth daily. Swallow whole., Disp: , Rfl:    Cholecalciferol (VITAMIN D-3) 1000 units CAPS, Take 1,000 Units by mouth daily. , Disp: , Rfl:    donepezil (ARICEPT) 5 MG tablet, TAKE ONE TABLET BY MOUTH AT BEDTIME, Disp: 90 tablet, Rfl: 2   hydrochlorothiazide (HYDRODIURIL) 25 MG tablet, TAKE ONE TABLET BY MOUTH DAILY, Disp: 90 tablet, Rfl: 1   levothyroxine (SYNTHROID) 75 MCG tablet, TAKE ONE TABLET BY MOUTH DAILY, Disp: 90 tablet, Rfl: 1   Multiple Vitamins-Minerals (MULTIVITAMIN WITH MINERALS) tablet, Take 1 tablet by mouth daily., Disp: , Rfl:    Omega-3 Fatty Acids (OMEGA 3 PO), Take 2 capsules by mouth daily., Disp: , Rfl:    simvastatin (ZOCOR) 10 MG tablet, Take 1 tablet (10 mg total) by mouth daily., Disp: 90 tablet, Rfl: 3   vitamin B-12 (CYANOCOBALAMIN) 100 MCG tablet, Take 100 mcg by mouth daily. , Disp: , Rfl:    REVIEW OF SYSTEMS:   [X]  denotes positive finding, [ ]   denotes negative finding Cardiac  Comments:  Chest pain or chest pressure:    Shortness of breath upon exertion:    Short of breath when lying flat:    Irregular heart rhythm:        Vascular    Pain in calf, thigh, or hip brought on by ambulation:    Pain in feet at night that wakes you up from your sleep:     Blood clot in your veins:    Leg swelling:         Pulmonary    Oxygen at home:    Productive cough:     Wheezing:         Neurologic    Sudden weakness in arms or legs:     Sudden  numbness in arms or legs:     Sudden onset of difficulty speaking or slurred speech:    Temporary loss of vision in one eye:     Problems with dizziness:         Gastrointestinal    Blood in stool:     Vomited blood:         Genitourinary    Burning when urinating:     Blood in urine:        Psychiatric    Major depression:         Hematologic    Bleeding problems:    Problems with blood clotting too easily:        Skin    Rashes or ulcers:        Constitutional    Fever or chills:     Barbie Banner, PA-C  Office: [phone] 09/14/2020 Clinic MD: Dr. Stanford Breed

## 2020-09-21 DIAGNOSIS — L820 Inflamed seborrheic keratosis: Secondary | ICD-10-CM | POA: Diagnosis not present

## 2020-09-21 DIAGNOSIS — D692 Other nonthrombocytopenic purpura: Secondary | ICD-10-CM | POA: Diagnosis not present

## 2020-09-21 DIAGNOSIS — D2261 Melanocytic nevi of right upper limb, including shoulder: Secondary | ICD-10-CM | POA: Diagnosis not present

## 2020-09-21 DIAGNOSIS — C44319 Basal cell carcinoma of skin of other parts of face: Secondary | ICD-10-CM | POA: Diagnosis not present

## 2020-09-21 DIAGNOSIS — L570 Actinic keratosis: Secondary | ICD-10-CM | POA: Diagnosis not present

## 2020-09-21 DIAGNOSIS — D1801 Hemangioma of skin and subcutaneous tissue: Secondary | ICD-10-CM | POA: Diagnosis not present

## 2020-09-21 DIAGNOSIS — L280 Lichen simplex chronicus: Secondary | ICD-10-CM | POA: Diagnosis not present

## 2020-09-21 DIAGNOSIS — D0439 Carcinoma in situ of skin of other parts of face: Secondary | ICD-10-CM | POA: Diagnosis not present

## 2020-09-21 DIAGNOSIS — Z85820 Personal history of malignant melanoma of skin: Secondary | ICD-10-CM | POA: Diagnosis not present

## 2020-09-21 DIAGNOSIS — D044 Carcinoma in situ of skin of scalp and neck: Secondary | ICD-10-CM | POA: Diagnosis not present

## 2020-09-21 DIAGNOSIS — D485 Neoplasm of uncertain behavior of skin: Secondary | ICD-10-CM | POA: Diagnosis not present

## 2020-09-21 DIAGNOSIS — L817 Pigmented purpuric dermatosis: Secondary | ICD-10-CM | POA: Diagnosis not present

## 2020-09-25 NOTE — Patient Instructions (Signed)
It has been a pleasure to see you today.  Medical issues appear to be stable.  Continue current medications and follow-up for Medicare wellness and health maintenance exam in 6 months.

## 2020-09-27 ENCOUNTER — Other Ambulatory Visit: Payer: Self-pay | Admitting: Internal Medicine

## 2020-10-06 DIAGNOSIS — N183 Chronic kidney disease, stage 3 unspecified: Secondary | ICD-10-CM | POA: Diagnosis not present

## 2020-10-13 DIAGNOSIS — D631 Anemia in chronic kidney disease: Secondary | ICD-10-CM | POA: Diagnosis not present

## 2020-10-13 DIAGNOSIS — N183 Chronic kidney disease, stage 3 unspecified: Secondary | ICD-10-CM | POA: Diagnosis not present

## 2020-10-13 DIAGNOSIS — N2581 Secondary hyperparathyroidism of renal origin: Secondary | ICD-10-CM | POA: Diagnosis not present

## 2020-10-13 DIAGNOSIS — I129 Hypertensive chronic kidney disease with stage 1 through stage 4 chronic kidney disease, or unspecified chronic kidney disease: Secondary | ICD-10-CM | POA: Diagnosis not present

## 2020-10-21 DIAGNOSIS — Z85828 Personal history of other malignant neoplasm of skin: Secondary | ICD-10-CM | POA: Diagnosis not present

## 2020-10-21 DIAGNOSIS — C44319 Basal cell carcinoma of skin of other parts of face: Secondary | ICD-10-CM | POA: Diagnosis not present

## 2020-10-25 ENCOUNTER — Other Ambulatory Visit: Payer: Self-pay

## 2020-10-25 MED ORDER — ALPRAZOLAM 1 MG PO TABS
ORAL_TABLET | ORAL | 0 refills | Status: DC
Start: 2020-10-25 — End: 2021-01-24

## 2020-10-25 NOTE — Telephone Encounter (Signed)
Patient is requesting a refill. Last refill 07/24/20 #90.

## 2020-10-28 DIAGNOSIS — Z85828 Personal history of other malignant neoplasm of skin: Secondary | ICD-10-CM | POA: Diagnosis not present

## 2020-10-28 DIAGNOSIS — D044 Carcinoma in situ of skin of scalp and neck: Secondary | ICD-10-CM | POA: Diagnosis not present

## 2020-11-26 ENCOUNTER — Encounter: Payer: Self-pay | Admitting: Internal Medicine

## 2020-12-03 ENCOUNTER — Ambulatory Visit: Payer: Self-pay | Admitting: *Deleted

## 2020-12-09 ENCOUNTER — Other Ambulatory Visit: Payer: Self-pay | Admitting: Internal Medicine

## 2020-12-09 DIAGNOSIS — H04551 Acquired stenosis of right nasolacrimal duct: Secondary | ICD-10-CM | POA: Diagnosis not present

## 2020-12-09 DIAGNOSIS — H04221 Epiphora due to insufficient drainage, right lacrimal gland: Secondary | ICD-10-CM | POA: Diagnosis not present

## 2020-12-20 ENCOUNTER — Telehealth: Payer: Self-pay

## 2020-12-20 NOTE — Telephone Encounter (Signed)
Called to let patient know what Dr Renold Genta said, she verbalized understanding

## 2020-12-20 NOTE — Telephone Encounter (Signed)
Patient would like to know if you think it is ok for her to donate blood. She states that the church is doing a blood drive. Please advise.

## 2020-12-28 ENCOUNTER — Other Ambulatory Visit: Payer: Self-pay | Admitting: Internal Medicine

## 2021-01-17 ENCOUNTER — Other Ambulatory Visit: Payer: Self-pay

## 2021-01-17 ENCOUNTER — Telehealth (INDEPENDENT_AMBULATORY_CARE_PROVIDER_SITE_OTHER): Payer: Medicare HMO | Admitting: Internal Medicine

## 2021-01-17 ENCOUNTER — Telehealth: Payer: Self-pay | Admitting: Internal Medicine

## 2021-01-17 ENCOUNTER — Encounter: Payer: Self-pay | Admitting: Internal Medicine

## 2021-01-17 VITALS — BP 141/70 | Temp 98.0°F

## 2021-01-17 DIAGNOSIS — J0100 Acute maxillary sinusitis, unspecified: Secondary | ICD-10-CM

## 2021-01-17 DIAGNOSIS — E7800 Pure hypercholesterolemia, unspecified: Secondary | ICD-10-CM

## 2021-01-17 DIAGNOSIS — E039 Hypothyroidism, unspecified: Secondary | ICD-10-CM

## 2021-01-17 DIAGNOSIS — U071 COVID-19: Secondary | ICD-10-CM

## 2021-01-17 DIAGNOSIS — Z9889 Other specified postprocedural states: Secondary | ICD-10-CM

## 2021-01-17 DIAGNOSIS — I10 Essential (primary) hypertension: Secondary | ICD-10-CM | POA: Diagnosis not present

## 2021-01-17 DIAGNOSIS — N1831 Chronic kidney disease, stage 3a: Secondary | ICD-10-CM | POA: Diagnosis not present

## 2021-01-17 DIAGNOSIS — G4700 Insomnia, unspecified: Secondary | ICD-10-CM | POA: Diagnosis not present

## 2021-01-17 MED ORDER — AZITHROMYCIN 250 MG PO TABS: ORAL_TABLET | ORAL | 0 refills | Status: AC

## 2021-01-17 NOTE — Patient Instructions (Signed)
We are sorry to hear you are not feeling well and have tested positive for COVID-19.  Please rest and drink plenty of fluids.  Walk some and monitor pulse oximetry.  Take Zithromax Z-PAK 2 tabs day 1 followed by 1 tab days 2 through 5.  Call if symptoms worsen or not improving.

## 2021-01-17 NOTE — Progress Notes (Signed)
   Subjective:    Patient ID: Melinda Payne, female    DOB: [birth year], 80 y.o.   MRN: [id number]  HPI This 80 year old White Female longstanding patient in this practice is seen today by interactive audio and video telecommunications due to the Coronavirus pandemic.  Patient is at her home and I am at my office.  She is agreeable to visit in this format today.  She is identified using 2 identifiers as Melinda Payne, a patient in this practice.  Patient tells me that she spent some time visiting with an elderly friend trying to help out while her friends family was out of town recently and thinks she may have contracted COVID-80 during that time.  Does not know of any other exposure.  Patient has a history of mild memory loss that runs in the family and is on Aricept.  History of anxiety and insomnia treated with Xanax.  History of hypothyroidism treated with thyroid replacement medication.  History of right carotid endarterectomy by Dr. Donnetta Hutching September 2021.  History of hyperlipidemia treated with Zocor.  She has a history of stage IIIa chronic kidney disease.  Records indicates she has had 4 COVID vaccines the last 1 being in May 2022.    Review of Systems     Objective:   Physical Exam Patient is seen virtually in no acute respiratory distress.  Able to give a clear concise history.  She looks slightly fatigued and a bit pale.       Assessment & Plan:  Acute COVID-19 virus infection  Plan: We discussed treatment with Paxlovid and patient declines this.  She is willing to take an antibiotic.  Feels that she has a sinus infection with maxillary sinus pressure.  Have sent in Zithromax Z-PAK 2 tabs day 1 followed by 1 tab days 2 through 5.  Says she is not severely ill but has malaise and fatigue and mainly sinus pressure.  She has to wait a couple of months before getting another COVID booster.  Time spent with this visit is 15 minutes.

## 2021-01-17 NOTE — Telephone Encounter (Signed)
scheduled

## 2021-01-17 NOTE — Telephone Encounter (Signed)
Melinda Payne [phone]  Aydin called to say she tested positive for COVID yesterday, she is running low grade fever 99-100, coughing, mucus, tired head is full of congestion. Has had 4 COVID shots

## 2021-01-22 ENCOUNTER — Other Ambulatory Visit: Payer: Self-pay | Admitting: Internal Medicine

## 2021-01-24 ENCOUNTER — Telehealth: Payer: Self-pay | Admitting: Internal Medicine

## 2021-01-24 NOTE — Telephone Encounter (Signed)
[facility]ce results to [facility] [phone], phone [phone]  +COVID 01/16/2021

## 2021-01-28 ENCOUNTER — Encounter: Payer: Self-pay | Admitting: Internal Medicine

## 2021-01-28 ENCOUNTER — Telehealth: Payer: Self-pay | Admitting: Internal Medicine

## 2021-01-28 NOTE — Telephone Encounter (Signed)
Had video visit for Covid-19 on November 14 and was given Z-pak. Called today saying she was still congested. Wanted Mucinex DM but this is available OTC. If not better next week, we can see her. She should not be contagious at this point. She had question about that as well. MJB, MD

## 2021-02-08 ENCOUNTER — Other Ambulatory Visit: Payer: Self-pay | Admitting: Internal Medicine

## 2021-02-14 ENCOUNTER — Other Ambulatory Visit: Payer: Medicare HMO | Admitting: Internal Medicine

## 2021-02-14 ENCOUNTER — Other Ambulatory Visit: Payer: Self-pay

## 2021-02-14 DIAGNOSIS — I10 Essential (primary) hypertension: Secondary | ICD-10-CM | POA: Diagnosis not present

## 2021-02-14 DIAGNOSIS — E7800 Pure hypercholesterolemia, unspecified: Secondary | ICD-10-CM

## 2021-02-14 DIAGNOSIS — E039 Hypothyroidism, unspecified: Secondary | ICD-10-CM

## 2021-02-14 DIAGNOSIS — E559 Vitamin D deficiency, unspecified: Secondary | ICD-10-CM | POA: Diagnosis not present

## 2021-02-15 ENCOUNTER — Encounter: Payer: Medicare HMO | Admitting: Internal Medicine

## 2021-02-15 ENCOUNTER — Ambulatory Visit (INDEPENDENT_AMBULATORY_CARE_PROVIDER_SITE_OTHER): Payer: Medicare HMO

## 2021-02-15 DIAGNOSIS — Z0000 Encounter for general adult medical examination without abnormal findings: Secondary | ICD-10-CM

## 2021-02-15 LAB — COMPLETE METABOLIC PANEL WITH GFR
AG Ratio: 1.6 (calc) (ref 1.0–2.5)
ALT: 10 U/L (ref 6–29)
AST: 14 U/L (ref 10–35)
Albumin: 4.2 g/dL (ref 3.6–5.1)
Alkaline phosphatase (APISO): 61 U/L (ref 37–153)
BUN/Creatinine Ratio: 15 (calc) (ref 6–22)
BUN: 17 mg/dL (ref 7–25)
CO2: 32 mmol/L (ref 20–32)
Calcium: 9.6 mg/dL (ref 8.6–10.4)
Chloride: 100 mmol/L (ref 98–110)
Creat: 1.11 mg/dL — ABNORMAL HIGH (ref 0.60–1.00)
Globulin: 2.6 g/dL (calc) (ref 1.9–3.7)
Glucose, Bld: 95 mg/dL (ref 65–99)
Potassium: 4.3 mmol/L (ref 3.5–5.3)
Sodium: 140 mmol/L (ref 135–146)
Total Bilirubin: 0.8 mg/dL (ref 0.2–1.2)
Total Protein: 6.8 g/dL (ref 6.1–8.1)
eGFR: 51 mL/min/{1.73_m2} — ABNORMAL LOW (ref >=60)

## 2021-02-15 LAB — CBC WITH DIFFERENTIAL/PLATELET
Absolute Monocytes: 437 cells/uL (ref 200–950)
Basophils Absolute: 9 cells/uL (ref 0–200)
Basophils Relative: 0.2 %
Eosinophils Absolute: 60 cells/uL (ref 15–500)
Eosinophils Relative: 1.3 %
HCT: 36.5 % (ref 35.0–45.0)
Hemoglobin: 12.3 g/dL (ref 11.7–15.5)
Lymphs Abs: 2397 cells/uL (ref 850–3900)
MCH: 31.9 pg (ref 27.0–33.0)
MCHC: 33.7 g/dL (ref 32.0–36.0)
MCV: 94.8 fL (ref 80.0–100.0)
MPV: 10.7 fL (ref 7.5–12.5)
Monocytes Relative: 9.5 %
Neutro Abs: 1697 cells/uL (ref 1500–7800)
Neutrophils Relative %: 36.9 %
Platelets: 146 10*3/uL (ref 140–400)
RBC: 3.85 10*6/uL (ref 3.80–5.10)
RDW: 11.8 % (ref 11.0–15.0)
Total Lymphocyte: 52.1 %
WBC: 4.6 10*3/uL (ref 3.8–10.8)

## 2021-02-15 LAB — LIPID PANEL
Cholesterol: 202 mg/dL — ABNORMAL HIGH (ref <200)
HDL: 88 mg/dL (ref >=50)
LDL Cholesterol (Calc): 98 mg/dL (calc)
Non-HDL Cholesterol (Calc): 114 mg/dL (calc) (ref <130)
Total CHOL/HDL Ratio: 2.3 (calc) (ref <5.0)
Triglycerides: 71 mg/dL (ref <150)

## 2021-02-15 LAB — TSH: TSH: 2.38 mIU/L (ref 0.40–4.50)

## 2021-02-15 LAB — VITAMIN D 25 HYDROXY (VIT D DEFICIENCY, FRACTURES): Vit D, 25-Hydroxy: 45 ng/mL (ref 30–100)

## 2021-02-15 NOTE — Progress Notes (Addendum)
I connected with  [name] on 02/15/21 by a audio enabled telemedicine application and verified that I am speaking with the correct person using two identifiers.  Patient Location: Home  Provider Location: Office/Clinic  I discussed the limitations of evaluation and management by telemedicine. The patient expressed understanding and agreed to proceed.  Subjective:   Melinda Payne is a 80 y.o. female who presents for Medicare Annual (Subsequent) preventive examination.  Review of Systems    Defer to PCP Cardiac Risk Factors include: advanced age (>28men, >52 women);dyslipidemia     Objective:    There were no vitals filed for this visit. There is no height or weight on file to calculate BMI.  Advanced Directives 02/15/2021 11/29/2019 11/26/2019 09/23/2019 11/23/2014  Does Patient Have a Medical Advance Directive? Yes Yes Yes Yes No  Type of Paramedic of [place];Living will [place];Living will [place];Living will [place] -  Does patient want to make changes to medical advance directive? No - Patient declined No - Patient declined No - Patient declined No - Patient declined -  Copy of [place] in Chart? No - copy requested No - copy requested No - copy requested - -    Current Medications (verified) Outpatient Encounter Medications as of 02/15/2021  Medication Sig   ALPRAZolam (XANAX) 0.25 MG tablet TAKE ONE TABLET BY MOUTH THREE TIMES A DAY   ALPRAZolam (XANAX) 1 MG tablet TAKE ONE TABLET BY MOUTH ONCE NIGHTLY AS NEEDED FOR INSOMNIA   aspirin EC 81 MG tablet Take 81 mg by mouth daily. Swallow whole.   Cholecalciferol (VITAMIN D-3) 1000 units CAPS Take 1,000 Units by mouth daily.    donepezil (ARICEPT) 5 MG tablet TAKE ONE TABLET BY MOUTH EVERY NIGHT AT BEDTIME   hydrochlorothiazide (HYDRODIURIL) 25 MG tablet TAKE ONE TABLET BY MOUTH DAILY   levothyroxine  (SYNTHROID) 75 MCG tablet TAKE ONE TABLET BY MOUTH DAILY   simvastatin (ZOCOR) 10 MG tablet TAKE ONE TABLET BY MOUTH DAILY   No facility-administered encounter medications on file as of 02/15/2021.    Allergies (verified) Penicillins and Codeine   History: Past Medical History:  Diagnosis Date   Anxiety    Arthritis    back   Cancer ([place]) 1997   melanoma rt neck   Carotid artery stenosis    Chronic kidney disease    stage renal disease   Counseling for estrogen replacement therapy    Fibrocystic breast disease    Hypertension    Hypothyroidism    Insomnia    Spinal stenosis    Vitamin D deficiency    Past Surgical History:  Procedure Laterality Date   ABDOMINAL HYSTERECTOMY  03/1997   bso endometrial cancer   ENDARTERECTOMY Right 11/28/2019   Procedure: RIGHT CAROTID ENDARTERECTOMY;  Surgeon: Rosetta Posner, MD;  Location: [facility] OR;  Service: Vascular;  Laterality: Right;   MELANOMA EXCISION     neck   PATCH ANGIOPLASTY Right 11/28/2019   Procedure: PATCH ANGIOPLASTY RIGHT CAROTID;  Surgeon: Rosetta Posner, MD;  Location: [facility] OR;  Service: Vascular;  Laterality: Right;   TUBAL LIGATION     Family History  Problem Relation Age of Onset   Alcohol abuse Father    Alzheimer's disease Brother    Dementia Brother    Alzheimer's disease Sister    Dementia Sister    Social History   Socioeconomic History   Marital status: Married  Spouse name: Not on file   Number of children: Not on file   Years of education: Not on file   Highest education level: Not on file  Occupational History   Not on file  Tobacco Use   Smoking status: Never   Smokeless tobacco: Never  Substance and Sexual Activity   Alcohol use: No   Drug use: No   Sexual activity: Not on file  Other Topics Concern   Not on file  Social History Narrative   Not on file   Social Determinants of Health   Financial Resource Strain: Low Risk    Difficulty of Paying Living Expenses: Not hard at all  Food  Insecurity: No Food Insecurity   Worried About Running Out of Food in the Last Year: Never true   [place] in the Last Year: Never true  Transportation Needs: No Transportation Needs   Lack of Transportation (Medical): No   Lack of Transportation (Non-Medical): No  Physical Activity: Insufficiently Active   Days of Exercise per Week: 4 days   Minutes of Exercise per Session: 30 min  Stress: No Stress Concern Present   Feeling of Stress : Not at all  Social Connections: Socially Integrated   Frequency of Communication with Friends and Family: More than three times a week   Frequency of Social Gatherings with Friends and Family: More than three times a week   Attends Religious Services: More than 4 times per year   Active Member of [employer] or Organizations: Yes   Attends Music therapist: More than 4 times per year   Marital Status: Married    Tobacco Counseling Counseling given: Not Answered   Clinical Intake:  Pre-visit preparation completed: Yes  Pain : No/denies pain     BMI - recorded: 29.6 Nutritional Status: BMI 25 -29 Overweight Nutritional Risks: None Diabetes: No  How often do you need to have someone help you when you read instructions, pamphlets, or other written materials from your doctor or pharmacy?: 1 - Never What is the last grade level you completed in school?: 12  Diabetic?no  Interpreter Needed?: No      Activities of Daily Living In your present state of health, do you have any difficulty performing the following activities: 02/15/2021  Hearing? N  Vision? N  Difficulty concentrating or making decisions? N  Walking or climbing stairs? N  Dressing or bathing? N  Doing errands, shopping? N  Preparing Food and eating ? N  Using the Toilet? N  In the past six months, have you accidently leaked urine? N  Do you have problems with loss of bowel control? N  Managing your Medications? N  Managing your Finances? N  Housekeeping  or managing your Housekeeping? N  Some recent data might be hidden    Patient Care Team: [place], Cresenciano Lick, MD as PCP - General (Internal Medicine) Laretta Alstrom Argie Ramming, RN as [place] any recent [place] you may have received from other than [facility] providers in the past year (date may be approximate).     Assessment:   This is a routine wellness examination for [place].  Hearing/Vision screen No results found.  Dietary issues and exercise activities discussed: Current Exercise Habits: The patient does not participate in regular exercise at present, Exercise limited by: None identified   Goals Addressed   None   Depression Screen [facility] 2/9 Scores 02/15/2021 02/12/2020 02/10/2019 10/16/2018 02/07/2018 01/24/2017 10/24/2016  PHQ -  2 Score 0 0 1 2 0 0 0  PHQ- 9 Score - - - 6 - - -    Fall Risk Fall Risk  02/15/2021 02/12/2020 08/21/2019 02/10/2019 02/07/2018  Falls in the past year? 0 0 0 0 0  Number falls in past yr: 0 0 0 - -  Injury with Fall? 1 0 0 - -  Follow up - Falls evaluation completed Falls evaluation completed - -    FALL RISK PREVENTION PERTAINING TO THE HOME:  Any stairs in or around the home? No  If so, are there any without handrails? No  Home free of loose throw rugs in walkways, pet beds, electrical cords, etc? Yes  Adequate lighting in your home to reduce risk of falls? Yes   ASSISTIVE DEVICES UTILIZED TO PREVENT FALLS:  Life alert? No  Use of a cane, walker or w/c? No  Grab bars in the bathroom? Yes  Shower chair or bench in shower? Yes  Elevated toilet seat or a handicapped toilet? No   TIMED UP AND GO:  Was the test performed? No .  Length of time to ambulate 10 feet: n/a sec.     Cognitive Function:     6CIT Screen 02/15/2021  What Year? 0 points  What month? 0 points  What time? 0 points  Count back from 20 0 points  Months in reverse 0 points  Repeat phrase 0 points  Total Score 0     Immunizations Immunization History  Administered Date(s) Administered   Fluad Quad(high Dose 65+) 12/01/2019   Influenza Inj Mdck Quad Pf 02/10/2019   Influenza Split 12/06/2010, 12/13/2011   Influenza Whole 11/23/2009   Influenza, High Dose Seasonal PF 11/25/2017   Influenza, Quadrivalent, Recombinant, Inj, Pf 12/17/2018   Influenza,inj,Quad PF,6+ Mos 12/13/2012, 12/08/2013, 12/01/2014, 11/10/2016   Influenza-Unspecified 01/12/2016   Moderna SARS-COV2 Booster Vaccination 11/25/2020   PFIZER(Purple Top)SARS-COV-2 Vaccination 03/27/2019, 04/17/2019, 12/31/2019, 07/10/2020   Pneumococcal Conjugate-13 11/16/2014   Pneumococcal Polysaccharide-23 11/02/2005, 02/15/2017   Td 05/06/1998   Tdap 08/16/2007, 12/01/2013   Zoster Recombinat (Shingrix) 03/22/2017, 05/27/2017   Zoster, Live 11/04/2009    TDAP status: Up to date  Flu: Up to date.   Pneumococcal vaccine status: Up to date  Covid-19 vaccine status: Information provided on how to obtain vaccines.   Qualifies for Shingles Vaccine? Yes   Zostavax completed Yes   Shingrix Completed?: Yes  Screening Tests Health Maintenance  Topic Date Due   Hepatitis C Screening  Never done   INFLUENZA VACCINE  10/04/2020   COVID-19 Vaccine (5 - Booster for Pfizer series) 01/20/2021   MAMMOGRAM  03/02/2021   TETANUS/TDAP  12/02/2023   Pneumonia Vaccine 36+ Years old  Completed   DEXA SCAN  Completed   Zoster Vaccines- Shingrix  Completed   HPV VACCINES  Aged Out   COLONOSCOPY (Pts 45-44yrs Insurance coverage will need to be confirmed)  Discontinued    Health Maintenance  Health Maintenance Due  Topic Date Due   Hepatitis C Screening  Never done   INFLUENZA VACCINE  10/04/2020   COVID-19 Vaccine (5 - Booster for Williams series) 01/20/2021    Colorectal cancer screening: Type of screening: Colonoscopy. Completed 09/16/2015. Repeat every 10 years  Mammogram status: Completed 03/02/20. Repeat every year  Bone Density status:  Completed 09/03/19. Results reflect: Bone density results: OSTEOPOROSIS. Repeat every unknown years.  Lung Cancer Screening: (Low Dose CT Chest recommended if Age 73-80 years, 30 pack-year currently smoking OR have quit w/in 15years.)  does not qualify.   Lung Cancer Screening Referral: n/a  Additional Screening:  Hepatitis C Screening: does qualify; Completed not yet  Vision Screening: Recommended annual ophthalmology exams for early detection of glaucoma and other disorders of the eye. Is the patient up to date with their annual eye exam?  No  Who is the provider or what is the name of the office in which the patient attends annual eye exams? Has appt in 04/2021 -cannot remember name If pt is not established with a provider, would they like to be referred to a provider to establish care?  N/a .   Dental Screening: Recommended annual dental exams for proper oral hygiene  Community Resource Referral / Chronic Care Management: CRR required this visit?  No   CCM required this visit?  No      Plan:     I have personally reviewed and noted the following in the patients chart:   Medical and social history Use of alcohol, tobacco or illicit drugs  Current medications and supplements including opioid prescriptions.  Functional ability and status Nutritional status Physical activity Advanced directives List of other physicians Hospitalizations, surgeries, and ER visits in previous 12 months Vitals Screenings to include cognitive, depression, and falls Referrals and appointments  In addition, I have reviewed and discussed with patient certain preventive protocols, quality metrics, and best practice recommendations. A written personalized care plan for preventive services as well as general preventive health recommendations were provided to patient.     Angus Seller, CMA   02/15/2021   Nurse Notes: Non face to face 25 minutes.  IElby Showers, MD, have reviewed all  documentation for this visit. The documentation on 02/21/21 for the exam, diagnosis, procedures, and orders are all accurate and complete.

## 2021-03-04 ENCOUNTER — Other Ambulatory Visit: Payer: Self-pay | Admitting: *Deleted

## 2021-03-04 NOTE — Patient Outreach (Signed)
[facility]) Care Management  03/04/2021  IDELLA LAMONTAGNE Jan 08, 1941 [id number]  Nurse Health Coach sent patient the [facility] Hypertension educational resource sheet.  Nurse will send educational materials every 6 months.   Plan: Nurse Health Coach will send patient additional hypertension education within the month of June.  Emelia Loron RN, [place] [phone] [email or web address]

## 2021-03-08 DIAGNOSIS — Z1231 Encounter for screening mammogram for malignant neoplasm of breast: Secondary | ICD-10-CM | POA: Diagnosis not present

## 2021-03-23 ENCOUNTER — Other Ambulatory Visit: Payer: Self-pay | Admitting: Internal Medicine

## 2021-03-28 ENCOUNTER — Encounter: Payer: Self-pay | Admitting: Internal Medicine

## 2021-03-28 DIAGNOSIS — Z85828 Personal history of other malignant neoplasm of skin: Secondary | ICD-10-CM | POA: Diagnosis not present

## 2021-03-28 DIAGNOSIS — D0461 Carcinoma in situ of skin of right upper limb, including shoulder: Secondary | ICD-10-CM | POA: Diagnosis not present

## 2021-03-28 DIAGNOSIS — L821 Other seborrheic keratosis: Secondary | ICD-10-CM | POA: Diagnosis not present

## 2021-03-28 DIAGNOSIS — D225 Melanocytic nevi of trunk: Secondary | ICD-10-CM | POA: Diagnosis not present

## 2021-03-28 DIAGNOSIS — L570 Actinic keratosis: Secondary | ICD-10-CM | POA: Diagnosis not present

## 2021-03-28 DIAGNOSIS — D485 Neoplasm of uncertain behavior of skin: Secondary | ICD-10-CM | POA: Diagnosis not present

## 2021-03-28 DIAGNOSIS — Z85820 Personal history of malignant melanoma of skin: Secondary | ICD-10-CM | POA: Diagnosis not present

## 2021-03-28 DIAGNOSIS — L309 Dermatitis, unspecified: Secondary | ICD-10-CM | POA: Diagnosis not present

## 2021-03-28 DIAGNOSIS — D2361 Other benign neoplasm of skin of right upper limb, including shoulder: Secondary | ICD-10-CM | POA: Diagnosis not present

## 2021-04-28 DIAGNOSIS — Z961 Presence of intraocular lens: Secondary | ICD-10-CM | POA: Diagnosis not present

## 2021-04-28 DIAGNOSIS — H52203 Unspecified astigmatism, bilateral: Secondary | ICD-10-CM | POA: Diagnosis not present

## 2021-04-28 DIAGNOSIS — D3132 Benign neoplasm of left choroid: Secondary | ICD-10-CM | POA: Diagnosis not present

## 2021-04-28 DIAGNOSIS — H18593 Other hereditary corneal dystrophies, bilateral: Secondary | ICD-10-CM | POA: Diagnosis not present

## 2021-05-18 DIAGNOSIS — Z961 Presence of intraocular lens: Secondary | ICD-10-CM | POA: Diagnosis not present

## 2021-05-18 DIAGNOSIS — H52221 Regular astigmatism, right eye: Secondary | ICD-10-CM | POA: Diagnosis not present

## 2021-05-18 DIAGNOSIS — H524 Presbyopia: Secondary | ICD-10-CM | POA: Diagnosis not present

## 2021-06-22 ENCOUNTER — Telehealth: Payer: Self-pay | Admitting: Internal Medicine

## 2021-06-22 NOTE — Telephone Encounter (Signed)
Gevena Barre ?[phone] ? ?Kathlee Nations called inquiring about having Shingles shot, when I look in chart. I see she does not need one, she has already had them. Will you confirm. ?

## 2021-06-23 NOTE — Telephone Encounter (Signed)
Mailed shot records ?

## 2021-07-18 ENCOUNTER — Other Ambulatory Visit: Payer: Self-pay | Admitting: Internal Medicine

## 2021-08-11 ENCOUNTER — Other Ambulatory Visit: Payer: Medicare HMO

## 2021-08-11 DIAGNOSIS — E7800 Pure hypercholesterolemia, unspecified: Secondary | ICD-10-CM | POA: Diagnosis not present

## 2021-08-11 DIAGNOSIS — E039 Hypothyroidism, unspecified: Secondary | ICD-10-CM | POA: Diagnosis not present

## 2021-08-11 LAB — BASIC METABOLIC PANEL
BUN/Creatinine Ratio: 13 (calc) (ref 6–22)
BUN: 16 mg/dL (ref 7–25)
CO2: 33 mmol/L — ABNORMAL HIGH (ref 20–32)
Calcium: 9.9 mg/dL (ref 8.6–10.4)
Chloride: 100 mmol/L (ref 98–110)
Creat: 1.27 mg/dL — ABNORMAL HIGH (ref 0.60–1.00)
Glucose, Bld: 92 mg/dL (ref 65–99)
Potassium: 4.4 mmol/L (ref 3.5–5.3)
Sodium: 141 mmol/L (ref 135–146)

## 2021-08-11 LAB — LIPID PANEL
Cholesterol: 184 mg/dL (ref <200)
HDL: 83 mg/dL (ref >=50)
LDL Cholesterol (Calc): 85 mg/dL (calc)
Non-HDL Cholesterol (Calc): 101 mg/dL (calc) (ref <130)
Total CHOL/HDL Ratio: 2.2 (calc) (ref <5.0)
Triglycerides: 69 mg/dL (ref <150)

## 2021-08-11 LAB — TSH: TSH: 1.92 mIU/L (ref 0.40–4.50)

## 2021-08-15 ENCOUNTER — Other Ambulatory Visit: Payer: Self-pay

## 2021-08-15 ENCOUNTER — Ambulatory Visit (INDEPENDENT_AMBULATORY_CARE_PROVIDER_SITE_OTHER): Payer: Medicare HMO | Admitting: Internal Medicine

## 2021-08-15 VITALS — BP 138/82 | HR 60 | Temp 98.4°F | Resp 18 | Ht 61.0 in | Wt 141.0 lb

## 2021-08-15 DIAGNOSIS — I10 Essential (primary) hypertension: Secondary | ICD-10-CM

## 2021-08-15 DIAGNOSIS — Z9889 Other specified postprocedural states: Secondary | ICD-10-CM | POA: Diagnosis not present

## 2021-08-15 DIAGNOSIS — H903 Sensorineural hearing loss, bilateral: Secondary | ICD-10-CM

## 2021-08-15 DIAGNOSIS — R634 Abnormal weight loss: Secondary | ICD-10-CM

## 2021-08-15 DIAGNOSIS — F411 Generalized anxiety disorder: Secondary | ICD-10-CM

## 2021-08-15 DIAGNOSIS — N1831 Chronic kidney disease, stage 3a: Secondary | ICD-10-CM | POA: Diagnosis not present

## 2021-08-15 DIAGNOSIS — E039 Hypothyroidism, unspecified: Secondary | ICD-10-CM | POA: Diagnosis not present

## 2021-08-15 DIAGNOSIS — E7800 Pure hypercholesterolemia, unspecified: Secondary | ICD-10-CM

## 2021-08-15 DIAGNOSIS — R69 Illness, unspecified: Secondary | ICD-10-CM | POA: Diagnosis not present

## 2021-08-15 NOTE — Patient Instructions (Addendum)
We noticed you have lost some weight.  Please try to eat at least 2 meals daily.  Follow-up here in 3 months at which time you will need c-Met and prealbumin.  You do not have to fast.  Continue current medications.    If you have lab work done today you will be contacted with your lab results within the next 2 weeks.  If you have not heard from [place] then please contact us. The fastest way to get your results is to register for My Chart.   IF you received an x-ray today, you will receive an invoice from [facility]. Please contact [facility] at [phone] with questions or concerns regarding your invoice.   IF you received labwork today, you will receive an invoice from [place]. Please contact [facility] at [phone] with questions or concerns regarding your invoice.   Our billing staff will not be able to assist you with questions regarding bills from these companies.  You will be contacted with the lab results as soon as they are available. The fastest way to get your results is to activate your My Chart account. Instructions are located on the last page of this paperwork. If you have not heard from [place] regarding the results in 2 weeks, please contact this office.

## 2021-08-15 NOTE — Progress Notes (Signed)
   Subjective:    Patient ID: Melinda Payne, female    DOB: [birth year], 81 y.o.   MRN: [id number]  HPI 81 year old Female seen for 6 month follow up.Doing fairly well. Labs are stable including TSH, Lipid panel and B-met.   Her creatinine varies with hydration but she does have CKD Stage 3a seen by [facility] in 2022.Followed there yearly by Dr. Posey Pronto. CKD thought to be due to HTN and some past NSAID use.   She has only sibling living at this time who is a sister and who has hx of lung cancer.  Patient resides alone. Does not leave the house much except to attend church. Continues to drive. Has not gotten lost driving.  History of right carotid endarterectomy in 2021 by Dr. Donnetta Hutching.  Has done well.  Hx left fib lateral malleolus Fx 2021 seen by Dr. Janett Labella.  Bilertal corneal dystrophies followed by Dr. Prudencio Burly.  Labs reviewed obtained prior to this visit include TSH normal at 1.92, fasting lipid panel completely within normal limits and basic metabolic panel stable with the exception of creatinine 1.27 consistent with chronic kidney disease stage IIIa.  Bicarb slightly elevated at 33.  Potassium is normal at 4.4.  Review of Systems- no new complaints     Objective:   Physical Exam Vital signs reviewed.  Blood pressure 138/82 pulse 60 regular respiratory rate 18 pulse oximetry 98% weight 141 pounds; BMI 26.64. Her weight is down 13 pounds since visit May 2022.  She weighed 154 pounds in June 2022.  We have no recent weights on her since June 2022.  Skin: Warm and dry.  Nodes none.  Neck is supple without JVD or thyromegaly.  Chest clear.  Cardiac exam: Regular rate and rhythm.  No lower extremity edema.     Assessment & Plan:  13 pound weight loss since May 2022.  I would like for her to follow-up here in 3 months for weight check instead of 6 months.  She has no complaints.  Not sure why she is lost weight aside from living alone.  Chronic kidney disease  stage IIIa followed by Dr. Posey Pronto on an annual basis  Hypothyroidism treated with thyroid replacement medication and TSH is stable on levothyroxine 75 mcg daily  History of mild hypertension treated with HCTZ 25 mg daily  History of hyperlipidemia treated with Zocor 10 mg daily.  History of anxiety treated with Xanax.  Mild memory loss started on Aricept in October 2022.  This is inherited and in multiple members of her family.  When she returns in 3 months we can draw c-Met and prealbumin.

## 2021-08-17 ENCOUNTER — Other Ambulatory Visit: Payer: Self-pay | Admitting: *Deleted

## 2021-08-17 NOTE — Patient Outreach (Addendum)
[facility]) Care Management  08/17/2021  Melinda Payne [birth year] [id number]   RN Health Coach closed patient's case. A case closed letter was sent to patient.  [facility] discontinued the educational six month mailing program.   Emelia Loron RN, [place] [phone] [name]

## 2021-08-31 ENCOUNTER — Ambulatory Visit: Payer: Medicare HMO | Admitting: *Deleted

## 2021-08-31 ENCOUNTER — Telehealth: Payer: Self-pay | Admitting: Internal Medicine

## 2021-08-31 ENCOUNTER — Encounter: Payer: Self-pay | Admitting: Internal Medicine

## 2021-08-31 NOTE — Telephone Encounter (Signed)
LVM to CB and schedule 3 month with labs prior preAlbumin, CMET, these are non fasting labs, Dr Renold Genta notice she has lost weight 13 lbs and needs to eat more.

## 2021-08-31 NOTE — Telephone Encounter (Signed)
scheduled

## 2021-09-19 ENCOUNTER — Other Ambulatory Visit: Payer: Self-pay | Admitting: Internal Medicine

## 2021-09-26 ENCOUNTER — Other Ambulatory Visit: Payer: Self-pay | Admitting: Internal Medicine

## 2021-09-27 DIAGNOSIS — D225 Melanocytic nevi of trunk: Secondary | ICD-10-CM | POA: Diagnosis not present

## 2021-09-27 DIAGNOSIS — Z85820 Personal history of malignant melanoma of skin: Secondary | ICD-10-CM | POA: Diagnosis not present

## 2021-09-27 DIAGNOSIS — L570 Actinic keratosis: Secondary | ICD-10-CM | POA: Diagnosis not present

## 2021-09-27 DIAGNOSIS — D0461 Carcinoma in situ of skin of right upper limb, including shoulder: Secondary | ICD-10-CM | POA: Diagnosis not present

## 2021-09-27 DIAGNOSIS — D485 Neoplasm of uncertain behavior of skin: Secondary | ICD-10-CM | POA: Diagnosis not present

## 2021-09-27 DIAGNOSIS — D692 Other nonthrombocytopenic purpura: Secondary | ICD-10-CM | POA: Diagnosis not present

## 2021-09-27 DIAGNOSIS — L817 Pigmented purpuric dermatosis: Secondary | ICD-10-CM | POA: Diagnosis not present

## 2021-09-27 DIAGNOSIS — L821 Other seborrheic keratosis: Secondary | ICD-10-CM | POA: Diagnosis not present

## 2021-09-27 DIAGNOSIS — D1801 Hemangioma of skin and subcutaneous tissue: Secondary | ICD-10-CM | POA: Diagnosis not present

## 2021-09-27 DIAGNOSIS — Z85828 Personal history of other malignant neoplasm of skin: Secondary | ICD-10-CM | POA: Diagnosis not present

## 2021-10-26 ENCOUNTER — Other Ambulatory Visit: Payer: Self-pay | Admitting: Internal Medicine

## 2021-11-07 ENCOUNTER — Other Ambulatory Visit: Payer: Self-pay | Admitting: Internal Medicine

## 2021-11-09 DIAGNOSIS — N2581 Secondary hyperparathyroidism of renal origin: Secondary | ICD-10-CM | POA: Diagnosis not present

## 2021-11-09 DIAGNOSIS — N189 Chronic kidney disease, unspecified: Secondary | ICD-10-CM | POA: Diagnosis not present

## 2021-11-09 DIAGNOSIS — N183 Chronic kidney disease, stage 3 unspecified: Secondary | ICD-10-CM | POA: Diagnosis not present

## 2021-11-09 DIAGNOSIS — D631 Anemia in chronic kidney disease: Secondary | ICD-10-CM | POA: Diagnosis not present

## 2021-11-09 DIAGNOSIS — I129 Hypertensive chronic kidney disease with stage 1 through stage 4 chronic kidney disease, or unspecified chronic kidney disease: Secondary | ICD-10-CM | POA: Diagnosis not present

## 2021-11-09 LAB — BASIC METABOLIC PANEL
BUN: 19 (ref 4–21)
Creatinine: 1.2 — AB (ref 0.5–1.1)
Glucose: 92
Potassium: 3.9 mEq/L (ref 3.5–5.1)
Sodium: 138 (ref 137–147)

## 2021-11-09 LAB — CBC: RBC: 3.95 (ref 3.87–5.11)

## 2021-11-09 LAB — CBC AND DIFFERENTIAL
HCT: 36 (ref 36–46)
Hemoglobin: 12.7 (ref 12.0–16.0)
Neutrophils Absolute: 41
Platelets: 154 10*3/uL (ref 150–400)
WBC: 7.1

## 2021-11-09 LAB — COMPREHENSIVE METABOLIC PANEL
Albumin: 4.6 (ref 3.5–5.0)
eGFR: 46

## 2021-11-09 LAB — VITAMIN D 25 HYDROXY (VIT D DEFICIENCY, FRACTURES): Vit D, 25-Hydroxy: 37.5

## 2021-11-14 ENCOUNTER — Other Ambulatory Visit: Payer: Medicare HMO

## 2021-11-14 DIAGNOSIS — I10 Essential (primary) hypertension: Secondary | ICD-10-CM | POA: Diagnosis not present

## 2021-11-14 DIAGNOSIS — R634 Abnormal weight loss: Secondary | ICD-10-CM

## 2021-11-14 DIAGNOSIS — E039 Hypothyroidism, unspecified: Secondary | ICD-10-CM | POA: Diagnosis not present

## 2021-11-15 ENCOUNTER — Ambulatory Visit (INDEPENDENT_AMBULATORY_CARE_PROVIDER_SITE_OTHER): Payer: Medicare HMO | Admitting: Internal Medicine

## 2021-11-15 ENCOUNTER — Encounter: Payer: Self-pay | Admitting: Internal Medicine

## 2021-11-15 VITALS — BP 110/74 | HR 71 | Temp 97.6°F | Ht 61.0 in | Wt 152.0 lb

## 2021-11-15 DIAGNOSIS — I10 Essential (primary) hypertension: Secondary | ICD-10-CM

## 2021-11-15 DIAGNOSIS — E039 Hypothyroidism, unspecified: Secondary | ICD-10-CM

## 2021-11-15 DIAGNOSIS — Z9889 Other specified postprocedural states: Secondary | ICD-10-CM | POA: Diagnosis not present

## 2021-11-15 DIAGNOSIS — Z23 Encounter for immunization: Secondary | ICD-10-CM | POA: Diagnosis not present

## 2021-11-15 DIAGNOSIS — E7800 Pure hypercholesterolemia, unspecified: Secondary | ICD-10-CM | POA: Diagnosis not present

## 2021-11-15 DIAGNOSIS — N1831 Chronic kidney disease, stage 3a: Secondary | ICD-10-CM

## 2021-11-15 LAB — COMPLETE METABOLIC PANEL WITH GFR
AG Ratio: 1.6 (calc) (ref 1.0–2.5)
ALT: 9 U/L (ref 6–29)
AST: 15 U/L (ref 10–35)
Albumin: 4.2 g/dL (ref 3.6–5.1)
Alkaline phosphatase (APISO): 72 U/L (ref 37–153)
BUN/Creatinine Ratio: 16 (calc) (ref 6–22)
BUN: 21 mg/dL (ref 7–25)
CO2: 29 mmol/L (ref 20–32)
Calcium: 9.8 mg/dL (ref 8.6–10.4)
Chloride: 101 mmol/L (ref 98–110)
Creat: 1.35 mg/dL — ABNORMAL HIGH (ref 0.60–1.00)
Globulin: 2.7 g/dL (calc) (ref 1.9–3.7)
Glucose, Bld: 94 mg/dL (ref 65–99)
Potassium: 4.3 mmol/L (ref 3.5–5.3)
Sodium: 140 mmol/L (ref 135–146)
Total Bilirubin: 0.5 mg/dL (ref 0.2–1.2)
Total Protein: 6.9 g/dL (ref 6.1–8.1)
eGFR: 40 mL/min/{1.73_m2} — ABNORMAL LOW (ref >=60)

## 2021-11-15 LAB — PREALBUMIN: Prealbumin: 18 mg/dL (ref 17–34)

## 2021-11-15 LAB — TSH: TSH: 2.32 mIU/L (ref 0.40–4.50)

## 2021-11-15 NOTE — Progress Notes (Signed)
Subjective:    Patient ID: Melinda Payne, female    DOB: 1940/10/17, 81 y.o.   MRN: [id number]  HPI 81 year old Female seen for follow up.  When she was here in June, she apparently weighed 141 pounds (BMI 26.64).  Prior to that she had not been weighed here since June 2022 due to the pandemic, but did have virtual communication without being weighed.  Weight in June 2022 was 154 pounds.  Therefore I was concerned that she has had a 13 pound weight loss.  I did look at her weight from [facility] from visit September 6 and she weighed 152 pounds on September 6.  No other weights are available in Epic from other visits.  Past medical history: In October 2021 she suffered a nondisplaced fracture of the lateral malleolus (left fibula) and was cared for by podiatrist.  In September 2021 she underwent right carotid endarterectomy by Dr. Donnetta Hutching.  This was discovered that she had a right carotid bruit.  She tolerated the procedure well.  History of mild memory loss treated with Aricept.  She has a strong family history of memory loss.  She takes simvastatin 10 mg daily for mild pure hypercholesterolemia.  History of hypothyroidism and is on thyroid replacement medication.  Had tubular adenoma on colonoscopy in 2000.  History of compound dysplastic nevus removed from her back in 1998.  History of herniated disc surgery at L5.  Bilateral tubal ligation in the late 1970s.  Abdominal hysterectomy with BSO for endometrial carcinoma 1999.  Melanoma left neck removed in 1997.  History of lumbar spinal stenosis, hypertension, vitamin D deficiency and chronic kidney disease stage IIIa.  Says her appetite is not quite as good as it used to be but feels that she is trying to eat healthy.   Review of Systems says she has not noted any weight loss on her scales at home.  She saw nephrologist at [facility] on September 6 and weight was 152 pounds     Objective:   Physical  Exam Vital signs reviewed.  Blood pressure 110/74 pulse 71, weight 152 pounds pulse oximetry 98%  She is seen in no acute distress.  Neck supple without JVD thyromegaly or carotid bruits.  Chest clear.  Cardiac exam: Regular rate and rhythm.  No lower extremity pitting edema.       Assessment & Plan:  Possible weight loss-she will continue to monitor her blood pressure at home.  I think her normal weight is around 150 pounds once or so.  Chronic kidney disease stage IIIa seen at [place]   Hyperlipidemia treated with low-dose simvastatin 10 mg daily  Mild memory loss treated with Aricept 5 mg daily  Mild hypertension treated with HCTZ 25 mg daily  History of anxiety treated with Xanax  History of right carotid endarterectomy September 2021  History of left ankle fracture November 2021  Anxiety and insomnia treated with Xanax  Family history of memory loss.  Patient has mild memory loss and is on Aricept.  Hypothyroidism treated with thyroid replacement medication and TSH is stable at 2.32 on current dose  Albumin was checked in conjunction with this visit and is low normal at 18.  She could eat more protein.  History of chronic kidney disease-creatinine 1.35.  Was 1.2 when checked on September 6.  Fluctuates between mid 1.3 range and 1.11.  Plan: She will return in mid December for Medicare Wellness visit and health maintenance exam.  She knows to call sooner if she has any questions or concerns.  Flu vaccine given.

## 2021-11-30 NOTE — Patient Instructions (Signed)
I think your average weight is around 150 pounds.  We may have missed weighed you when you were here in June.  Please continue to monitor your weight at home and let me know if it fluctuates.  Continue current medications and follow-up with [employer].  Flu vaccine given today.  Return in December for Medicare wellness visit.

## 2021-12-13 ENCOUNTER — Other Ambulatory Visit: Payer: Self-pay | Admitting: Internal Medicine

## 2021-12-29 ENCOUNTER — Other Ambulatory Visit: Payer: Self-pay | Admitting: *Deleted

## 2021-12-29 DIAGNOSIS — I6523 Occlusion and stenosis of bilateral carotid arteries: Secondary | ICD-10-CM

## 2022-01-11 ENCOUNTER — Ambulatory Visit: Payer: Medicare HMO | Admitting: Physician Assistant

## 2022-01-11 ENCOUNTER — Encounter (HOSPITAL_COMMUNITY): Payer: Medicare HMO

## 2022-01-11 ENCOUNTER — Ambulatory Visit (HOSPITAL_COMMUNITY)
Admission: RE | Admit: 2022-01-11 | Discharge: 2022-01-11 | Disposition: A | Discharge status: Home / Self Care | Payer: Medicare HMO | Source: Ambulatory Visit | Attending: Physician Assistant | Admitting: Physician Assistant

## 2022-01-11 ENCOUNTER — Ambulatory Visit: Payer: Medicare HMO

## 2022-01-11 VITALS — BP 131/58 | HR 61 | Temp 97.7°F | Ht 62.0 in | Wt 150.0 lb

## 2022-01-11 DIAGNOSIS — I6523 Occlusion and stenosis of bilateral carotid arteries: Secondary | ICD-10-CM | POA: Diagnosis not present

## 2022-01-11 NOTE — Progress Notes (Signed)
History of Present Illness:  Patient is a 81 y.o. year old female who presents for evaluation of carotid stenosis.  She was found to have a right carotid bruit and underwent duplex showing critical stenosis in her right internal carotid artery with no significant stenosis in the left carotid system. Dr. Donnetta Hutching perform right CEA.   She specifically denies any prior episodes of amaurosis fugax, expressive aphasia or transient ischemic attack or stroke.   She stays active on a daily basis with chores.  She is medically managed on daily ASA and Statin.    Past Medical History:  Diagnosis Date   Anxiety    Arthritis    back   Cancer ([place]) 1997   melanoma rt neck   Carotid artery stenosis    Chronic kidney disease    stage renal disease   Counseling for estrogen replacement therapy    Fibrocystic breast disease    Hypertension    Hypothyroidism    Insomnia    Spinal stenosis    Vitamin D deficiency     Past Surgical History:  Procedure Laterality Date   ABDOMINAL HYSTERECTOMY  03/1997   bso endometrial cancer   ENDARTERECTOMY Right 11/28/2019   Procedure: RIGHT CAROTID ENDARTERECTOMY;  Surgeon: Rosetta Posner, MD;  Location: [facility] OR;  Service: Vascular;  Laterality: Right;   MELANOMA EXCISION     neck   PATCH ANGIOPLASTY Right 11/28/2019   Procedure: PATCH ANGIOPLASTY RIGHT CAROTID;  Surgeon: Rosetta Posner, MD;  Location: [facility] OR;  Service: Vascular;  Laterality: Right;   TUBAL LIGATION       Social History Social History   Tobacco Use   Smoking status: Never   Smokeless tobacco: Never  Substance Use Topics   Alcohol use: No   Drug use: No    Family History Family History  Problem Relation Age of Onset   Alcohol abuse Father    Alzheimer's disease Brother    Dementia Brother    Alzheimer's disease Sister    Dementia Sister     Allergies  Allergies  Allergen Reactions   Penicillins Hives   Codeine Hives     Current Outpatient Medications  Medication Sig  Dispense Refill   ALPRAZolam (XANAX) 0.25 MG tablet TAKE ONE TABLET BY MOUTH THREE TIMES A DAY 90 tablet 5   ALPRAZolam (XANAX) 1 MG tablet TAKE ONE TABLET BY MOUTH EVERY EVENING AS NEEDED FOFR INSOMNIA 90 tablet 2   aspirin EC 81 MG tablet Take 81 mg by mouth daily. Swallow whole.     Cholecalciferol (VITAMIN D-3) 1000 units CAPS Take 1,000 Units by mouth daily.      donepezil (ARICEPT) 5 MG tablet TAKE ONE TABLET BY MOUTH EVERY NIGHT AT BEDTIME 90 tablet 2   hydrochlorothiazide (HYDRODIURIL) 25 MG tablet TAKE ONE TABLET BY MOUTH DAILY 90 tablet 1   levothyroxine (SYNTHROID) 75 MCG tablet TAKE ONE TABLET BY MOUTH DAILY 90 tablet 1   simvastatin (ZOCOR) 10 MG tablet TAKE ONE TABLET BY MOUTH DAILY 90 tablet 3   No current facility-administered medications for this visit.    ROS:   General:  No weight loss, Fever, chills  HEENT: No recent headaches, no nasal bleeding, no visual changes, no sore throat  Neurologic: No dizziness, blackouts, seizures. No recent symptoms of stroke or mini- stroke. No recent episodes of slurred speech, or temporary blindness.  Cardiac: No recent episodes of chest pain/pressure, no shortness of breath at rest.  No shortness  of breath with exertion.  Denies history of atrial fibrillation or irregular heartbeat  Vascular: No history of rest pain in feet.  No history of claudication.  No history of non-healing ulcer, No history of DVT   Pulmonary: No home oxygen, no productive cough, no hemoptysis,  No asthma or wheezing  Musculoskeletal:  '[ ]'$  Arthritis, '[ ]'$  Low back pain,  '[ ]'$  Joint pain  Hematologic:No history of hypercoagulable state.  No history of easy bleeding.  No history of anemia  Gastrointestinal: No hematochezia or melena,  No gastroesophageal reflux, no trouble swallowing  Urinary: '[ ]'$  chronic Kidney disease, '[ ]'$  on HD - '[ ]'$  MWF or '[ ]'$  TTHS, '[ ]'$  Burning with urination, '[ ]'$  Frequent urination, '[ ]'$  Difficulty urinating;   Skin: No  rashes  Psychological: No history of anxiety,  No history of depression   Physical Examination  Vitals:   01/11/22 1442 01/11/22 1446  BP: (!) 144/66 (!) 131/58  Pulse: 61   Temp: 97.7 F (36.5 C)   TempSrc: Temporal   SpO2: 100%   Weight: 150 lb (68 kg)   Height: '5\' 2"'$  (1.575 m)     Body mass index is 27.44 kg/m.  General:  Alert and oriented, no acute distress HEENT: Normal Neck: No bruit or JVD Pulmonary: Clear to auscultation bilaterally Cardiac: Regular Rate and Rhythm without murmur Gastrointestinal: Soft, non-tender, non-distended, no mass, no scars Skin: No rash Extremity Pulses:  2+ radial,  dorsalis pedis, pulses bilaterally Musculoskeletal: No deformity or edema  Neurologic: Upper and lower extremity motor 5/5 and symmetric  DATA:   ight Carotid Findings:  +----------+--------+--------+--------+------------------+--------+           PSV cm/sEDV cm/sStenosisPlaque DescriptionComments  +----------+--------+--------+--------+------------------+--------+  CCA Prox  101     11                                          +----------+--------+--------+--------+------------------+--------+  CCA Mid   97      12                                          +----------+--------+--------+--------+------------------+--------+  CCA Distal107     15                                          +----------+--------+--------+--------+------------------+--------+  ICA Prox  81      19      1-39%                               +----------+--------+--------+--------+------------------+--------+  ICA Mid   89      17                                          +----------+--------+--------+--------+------------------+--------+  ICA Distal106     22                                          +----------+--------+--------+--------+------------------+--------+  ECA      98      5                                            +----------+--------+--------+--------+------------------+--------+   +----------+--------+-------+--------+-------------------+           PSV cm/sEDV cmsDescribeArm Pressure (mmHG)  +----------+--------+-------+--------+-------------------+  Subclavian271    13     Stenotic130                  +----------+--------+-------+--------+-------------------+   +---------+--------+--+--------+-+---------+  VertebralPSV cm/s33EDV cm/s7Antegrade  +---------+--------+--+--------+-+---------+      Left Carotid Findings:  +----------+--------+--------+--------+-------------------------+--------+           PSV cm/sEDV cm/sStenosisPlaque Description       Comments  +----------+--------+--------+--------+-------------------------+--------+  CCA Prox  107     20                                                 +----------+--------+--------+--------+-------------------------+--------+  CCA Mid   102     20                                                 +----------+--------+--------+--------+-------------------------+--------+  CCA Distal82      20              heterogenous                       +----------+--------+--------+--------+-------------------------+--------+  ICA Prox  91      20      1-39%   heterogenous and calcific          +----------+--------+--------+--------+-------------------------+--------+  ICA Mid   148     35                                                 +----------+--------+--------+--------+-------------------------+--------+  ICA Distal125     35                                                 +----------+--------+--------+--------+-------------------------+--------+  ECA      97      5                                                  +----------+--------+--------+--------+-------------------------+--------+   +----------+--------+--------+----------------+-------------------+           PSV  cm/sEDV cm/sDescribe        Arm Pressure (mmHG)  +----------+--------+--------+----------------+-------------------+  [id number]     4       Multiphasic, [id number]                  +----------+--------+--------+----------------+-------------------+   +---------+--------+--+--------+--+---------+  VertebralPSV cm/s67EDV cm/s14Antegrade  +---------+--------+--+--------+--+---------+  Summary:  Right Carotid: Velocities in the right ICA are consistent with a 1-39%  stenosis.   Left Carotid: Velocities in the left ICA are consistent with a 1-39%  stenosis.   Vertebrals: Bilateral vertebral arteries demonstrate antegrade flow.  Subclavians: Right subclavian artery was stenotic. Normal flow  hemodynamics were               seen in the left subclavian artery.   ASSESSMENT/PLAN: Carotid stenosis s/p right carotid endarterectomy by Dr. Donnetta Hutching on 11/28/2019 for asymptomatic right ICA stenosis.  Duplex demonstrated < 39% stenosis B ICA. She remains asymptomatic for stroke/TIA. Continue the following medications: aspirin and statin Follow-up in 2 year with carotid duplex ultrasound.     Roxy Horseman PA-C [facility] Office: [phone]  MD in clinic [place]

## 2022-01-12 ENCOUNTER — Encounter: Payer: Self-pay | Admitting: Physician Assistant

## 2022-01-16 ENCOUNTER — Telehealth: Payer: Self-pay

## 2022-01-16 NOTE — Telephone Encounter (Signed)
Patient called stating she has a lot of sinus congestion and would like to know what type of medication she should take over the counter or if she needs to schedule an appt in the next couple days

## 2022-01-16 NOTE — Telephone Encounter (Signed)
Patient informed of message and will call back if need be

## 2022-01-18 ENCOUNTER — Ambulatory Visit: Payer: Medicare HMO

## 2022-01-18 ENCOUNTER — Encounter (HOSPITAL_COMMUNITY): Payer: Medicare HMO

## 2022-01-19 ENCOUNTER — Encounter: Payer: Self-pay | Admitting: Internal Medicine

## 2022-01-19 ENCOUNTER — Ambulatory Visit (INDEPENDENT_AMBULATORY_CARE_PROVIDER_SITE_OTHER): Payer: Medicare HMO | Admitting: Internal Medicine

## 2022-01-19 VITALS — BP 116/76 | HR 67 | Temp 98.2°F

## 2022-01-19 DIAGNOSIS — J3489 Other specified disorders of nose and nasal sinuses: Secondary | ICD-10-CM | POA: Diagnosis not present

## 2022-01-19 DIAGNOSIS — J069 Acute upper respiratory infection, unspecified: Secondary | ICD-10-CM | POA: Diagnosis not present

## 2022-01-19 DIAGNOSIS — R059 Cough, unspecified: Secondary | ICD-10-CM

## 2022-01-19 DIAGNOSIS — R0989 Other specified symptoms and signs involving the circulatory and respiratory systems: Secondary | ICD-10-CM | POA: Diagnosis not present

## 2022-01-19 MED ORDER — BENZONATATE 100 MG PO CAPS: 100.0000 mg | ORAL_CAPSULE | Freq: Three times a day (TID) | ORAL | 0 refills | Status: DC | PRN

## 2022-01-19 MED ORDER — METHYLPREDNISOLONE ACETATE 80 MG/ML IJ SUSP
80.0000 mg | Freq: Once | INTRAMUSCULAR | Status: AC
  Administered 2022-01-19: 80 mg via INTRAMUSCULAR

## 2022-01-19 MED ORDER — AZITHROMYCIN 250 MG PO TABS: ORAL_TABLET | ORAL | 0 refills | Status: AC

## 2022-01-21 LAB — RESPIRATORY VIRUS PANEL

## 2022-02-02 NOTE — Progress Notes (Signed)
   Subjective:    Patient ID: Melinda Payne, female    DOB: 03/01/1941, 81 y.o.   MRN: [id number]  HPI Patient seen acutely today after calling on November 13 complaining of sinus congestion.  She wanted to know what type of medication she could take and was told it was safe to take Ch[place] over-the-counter but not to take it and drive at the same time.  Suggested Flonase nasal spray and avoiding decongestants which might not agree with her BP or heart rhythm.  She now has a cough.  She is concerned about this.  With the holidays coming she has a lot of cooking today for church activities and family activities.  She feels that she is developing a sinus infection.  She has essential hypertension, stage III chronic kidney disease, hypothyroidism and pure hypercholesterolemia.  History of carotid endarterectomy.  History of mild memory loss.  Review of Systems see above-denies fever, shaking chills, headache, nausea or vomiting     Objective:   Physical Exam  She is afebrile.  Pulse oximetry is 100% on room air temperature 98.2 degrees pulse 67 regular blood pressure 116/76  Skin: Warm and dry.  No cervical adenopathy.  Pharynx is clear.  Neck supple.  TMs clear.  Chest is clear to auscultation without rales or wheezing.      Assessment & Plan:   Acute upper respiratory infection  Plan: Patient is anxious to have medication prescribed.  I think Depo-Medrol 80 mg IM would help with congestion.  Prescribed Zithromax Z-Pak 2 tabs day 1 followed by 1 tab days 2 through 5.  Respiratory virus panel obtained and results reviewed and no evidence of viral illness detected in this panel.  It may be that she had a bacterial infection instead.  In any case no virus was detected that would be causing the symptoms acutely.  Time spent with this patient including reviewing respiratory virus panel results and communicating information to patient is 20 minutes

## 2022-02-07 ENCOUNTER — Encounter: Payer: Self-pay | Admitting: Internal Medicine

## 2022-02-07 ENCOUNTER — Telehealth: Payer: Self-pay

## 2022-02-07 ENCOUNTER — Telehealth: Payer: Self-pay | Admitting: Internal Medicine

## 2022-02-07 ENCOUNTER — Ambulatory Visit (INDEPENDENT_AMBULATORY_CARE_PROVIDER_SITE_OTHER): Payer: Medicare HMO | Admitting: Internal Medicine

## 2022-02-07 VITALS — BP 128/66 | HR 68 | Temp 98.4°F

## 2022-02-07 DIAGNOSIS — J0101 Acute recurrent maxillary sinusitis: Secondary | ICD-10-CM | POA: Diagnosis not present

## 2022-02-07 MED ORDER — METHYLPREDNISOLONE 4 MG PO TBPK: ORAL_TABLET | ORAL | 0 refills | Status: DC

## 2022-02-07 MED ORDER — AMOXICILLIN-POT CLAVULANATE 500-125 MG PO TABS: 1.0000 | ORAL_TABLET | Freq: Three times a day (TID) | ORAL | 0 refills | Status: DC

## 2022-02-07 NOTE — Telephone Encounter (Signed)
scheduled

## 2022-02-07 NOTE — Telephone Encounter (Signed)
Lib Lofland [phone]  Lib called to say she is having Stuffy nose, back of head has sharpe pain at times, I scheduled her for car visit this afternoon.

## 2022-02-07 NOTE — Progress Notes (Signed)
   Subjective:    Patient ID: Melinda Payne, female    DOB: Nov 19, 1940, 81 y.o.   MRN: [id number]  HPI  81 year old Female seen for persistent left parietal pain and nasal congestion. Did not improve she says after being seen in mid November for a cough and sinus congestion.  At that time was given Depo-Medrol 80 mg IM and a Zithromax Z-PAK.  Respiratory virus panel was negative.  Now complaining of some pain in the occipital neck area, malaise and fatigue.  Home COVID test was negative today.  She has a history of right carotid endarterectomy in 2021 by Dr. Donnetta Hutching and has done well.  She has a history of hypertension.  Mild chronic kidney disease felt to be due to hypertension and past NSAID use followed by [employer].  History of hypothyroidism and chronic kidney disease stage III.  History of hyperlipidemia and hearing loss in both ears.  History of anxiety.  Family history of memory loss and several family members.    Review of Systems see above no fever, shaking chills, nausea or vomiting     Objective:   Physical Exam  Blood pressure 128/66, pulse 68 regular temperature 98.4 degrees pulse oximetry 98% on room air TMs are clear.  Neck is supple.  Chest clear.  Pharynx is clear.     Assessment & Plan:   Protracted respiratory congestion and left parietal pain.  Patient feels that she has sinusitis.  Despite being treated in November has not improved.  Plan: Patient will be given a tapering course of Medrol 4 mg Dosepak for 6 days and Augmentin 500 mg 3 times a day for 10 days.  May need allergy testing if not improving.

## 2022-02-07 NOTE — Telephone Encounter (Signed)
Patient called stating she is having some pain on the back of her neck and not feeling well and would like to seen this week, she was informed to take a Covid test and call back with results

## 2022-02-14 ENCOUNTER — Other Ambulatory Visit: Payer: Medicare HMO

## 2022-02-14 DIAGNOSIS — E039 Hypothyroidism, unspecified: Secondary | ICD-10-CM | POA: Diagnosis not present

## 2022-02-14 DIAGNOSIS — I10 Essential (primary) hypertension: Secondary | ICD-10-CM | POA: Diagnosis not present

## 2022-02-14 DIAGNOSIS — E7800 Pure hypercholesterolemia, unspecified: Secondary | ICD-10-CM

## 2022-02-14 DIAGNOSIS — N1831 Chronic kidney disease, stage 3a: Secondary | ICD-10-CM | POA: Diagnosis not present

## 2022-02-15 LAB — COMPLETE METABOLIC PANEL WITH GFR
AG Ratio: 1.8 (calc) (ref 1.0–2.5)
ALT: 12 U/L (ref 6–29)
AST: 15 U/L (ref 10–35)
Albumin: 4.4 g/dL (ref 3.6–5.1)
Alkaline phosphatase (APISO): 73 U/L (ref 37–153)
BUN/Creatinine Ratio: 18 (calc) (ref 6–22)
BUN: 22 mg/dL (ref 7–25)
CO2: 33 mmol/L — ABNORMAL HIGH (ref 20–32)
Calcium: 9.8 mg/dL (ref 8.6–10.4)
Chloride: 97 mmol/L — ABNORMAL LOW (ref 98–110)
Creat: 1.24 mg/dL — ABNORMAL HIGH (ref 0.60–1.00)
Globulin: 2.5 g/dL (calc) (ref 1.9–3.7)
Glucose, Bld: 88 mg/dL (ref 65–99)
Potassium: 4.6 mmol/L (ref 3.5–5.3)
Sodium: 140 mmol/L (ref 135–146)
Total Bilirubin: 0.7 mg/dL (ref 0.2–1.2)
Total Protein: 6.9 g/dL (ref 6.1–8.1)
eGFR: 44 mL/min/{1.73_m2} — ABNORMAL LOW (ref >=60)

## 2022-02-15 LAB — CBC WITH DIFFERENTIAL/PLATELET
Absolute Monocytes: 888 cells/uL (ref 200–950)
Basophils Absolute: 22 cells/uL (ref 0–200)
Basophils Relative: 0.3 %
Eosinophils Absolute: 67 cells/uL (ref 15–500)
Eosinophils Relative: 0.9 %
HCT: 38.8 % (ref 35.0–45.0)
Hemoglobin: 13.3 g/dL (ref 11.7–15.5)
Lymphs Abs: 3471 cells/uL (ref 850–3900)
MCH: 31.9 pg (ref 27.0–33.0)
MCHC: 34.3 g/dL (ref 32.0–36.0)
MCV: 93 fL (ref 80.0–100.0)
MPV: 10.6 fL (ref 7.5–12.5)
Monocytes Relative: 12 %
Neutro Abs: 2953 cells/uL (ref 1500–7800)
Neutrophils Relative %: 39.9 %
Platelets: 168 10*3/uL (ref 140–400)
RBC: 4.17 10*6/uL (ref 3.80–5.10)
RDW: 11.8 % (ref 11.0–15.0)
Total Lymphocyte: 46.9 %
WBC: 7.4 10*3/uL (ref 3.8–10.8)

## 2022-02-15 LAB — LIPID PANEL
Cholesterol: 202 mg/dL — ABNORMAL HIGH (ref <200)
HDL: 94 mg/dL (ref >=50)
LDL Cholesterol (Calc): 92 mg/dL (calc)
Non-HDL Cholesterol (Calc): 108 mg/dL (calc) (ref <130)
Total CHOL/HDL Ratio: 2.1 (calc) (ref <5.0)
Triglycerides: 70 mg/dL (ref <150)

## 2022-02-15 LAB — TSH: TSH: 6.91 mIU/L — ABNORMAL HIGH (ref 0.40–4.50)

## 2022-02-17 ENCOUNTER — Encounter: Payer: Self-pay | Admitting: Internal Medicine

## 2022-02-17 ENCOUNTER — Ambulatory Visit (INDEPENDENT_AMBULATORY_CARE_PROVIDER_SITE_OTHER): Payer: Medicare HMO | Admitting: Internal Medicine

## 2022-02-17 VITALS — BP 128/70 | HR 70 | Temp 98.0°F | Ht 60.75 in | Wt 147.8 lb

## 2022-02-17 DIAGNOSIS — R011 Cardiac murmur, unspecified: Secondary | ICD-10-CM

## 2022-02-17 DIAGNOSIS — Z85820 Personal history of malignant melanoma of skin: Secondary | ICD-10-CM | POA: Diagnosis not present

## 2022-02-17 DIAGNOSIS — Z23 Encounter for immunization: Secondary | ICD-10-CM | POA: Diagnosis not present

## 2022-02-17 DIAGNOSIS — Z9889 Other specified postprocedural states: Secondary | ICD-10-CM

## 2022-02-17 DIAGNOSIS — G4700 Insomnia, unspecified: Secondary | ICD-10-CM | POA: Diagnosis not present

## 2022-02-17 DIAGNOSIS — F411 Generalized anxiety disorder: Secondary | ICD-10-CM

## 2022-02-17 DIAGNOSIS — E7800 Pure hypercholesterolemia, unspecified: Secondary | ICD-10-CM | POA: Diagnosis not present

## 2022-02-17 DIAGNOSIS — Z0000 Encounter for general adult medical examination without abnormal findings: Secondary | ICD-10-CM

## 2022-02-17 DIAGNOSIS — N1831 Chronic kidney disease, stage 3a: Secondary | ICD-10-CM

## 2022-02-17 DIAGNOSIS — R0989 Other specified symptoms and signs involving the circulatory and respiratory systems: Secondary | ICD-10-CM | POA: Diagnosis not present

## 2022-02-17 DIAGNOSIS — I10 Essential (primary) hypertension: Secondary | ICD-10-CM | POA: Diagnosis not present

## 2022-02-17 DIAGNOSIS — Z818 Family history of other mental and behavioral disorders: Secondary | ICD-10-CM

## 2022-02-17 DIAGNOSIS — R69 Illness, unspecified: Secondary | ICD-10-CM | POA: Diagnosis not present

## 2022-02-17 DIAGNOSIS — E039 Hypothyroidism, unspecified: Secondary | ICD-10-CM | POA: Diagnosis not present

## 2022-02-17 DIAGNOSIS — H903 Sensorineural hearing loss, bilateral: Secondary | ICD-10-CM | POA: Diagnosis not present

## 2022-02-17 DIAGNOSIS — Z8542 Personal history of malignant neoplasm of other parts of uterus: Secondary | ICD-10-CM

## 2022-02-17 LAB — POCT URINALYSIS DIPSTICK
Bilirubin, UA: NEGATIVE
Blood, UA: NEGATIVE
Glucose, UA: NEGATIVE
Ketones, UA: NEGATIVE
Leukocytes, UA: NEGATIVE
Nitrite, UA: NEGATIVE
Protein, UA: NEGATIVE
Spec Grav, UA: 1.01 (ref 1.010–1.025)
Urobilinogen, UA: 1 E.U./dL
pH, UA: 7.5 (ref 5.0–8.0)

## 2022-02-17 MED ORDER — LEVOTHYROXINE SODIUM 88 MCG PO TABS: 88.0000 ug | ORAL_TABLET | Freq: Every day | ORAL | 0 refills | Status: DC
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
## 2022-02-17 NOTE — Patient Instructions (Addendum)
Increase levothyroxine to 88 mcg daily and RTC in  6 weeks for TSH and OV. Vaccines including RSV discussed.  Pneumococcal 20 vaccine given.

## 2022-02-22 ENCOUNTER — Ambulatory Visit (HOSPITAL_COMMUNITY)
Admission: RE | Admit: 2022-02-22 | Discharge: 2022-02-22 | Disposition: A | Discharge status: Home / Self Care | Payer: Medicare HMO | Source: Ambulatory Visit | Attending: Internal Medicine | Admitting: Internal Medicine

## 2022-02-22 DIAGNOSIS — I779 Disorder of arteries and arterioles, unspecified: Secondary | ICD-10-CM | POA: Diagnosis not present

## 2022-02-22 DIAGNOSIS — I351 Nonrheumatic aortic (valve) insufficiency: Secondary | ICD-10-CM | POA: Insufficient documentation

## 2022-02-22 DIAGNOSIS — I129 Hypertensive chronic kidney disease with stage 1 through stage 4 chronic kidney disease, or unspecified chronic kidney disease: Secondary | ICD-10-CM | POA: Insufficient documentation

## 2022-02-22 DIAGNOSIS — R011 Cardiac murmur, unspecified: Secondary | ICD-10-CM

## 2022-02-22 DIAGNOSIS — N189 Chronic kidney disease, unspecified: Secondary | ICD-10-CM | POA: Insufficient documentation

## 2022-02-22 LAB — ECHOCARDIOGRAM COMPLETE
AR max vel: 1.4 cm2
AV Peak grad: 12.4 mmHg
Ao pk vel: 1.76 m/s
Area-P 1/2: 4.49 cm2
S' Lateral: 2 cm

## 2022-02-22 NOTE — Progress Notes (Signed)
  Echocardiogram 2D Echocardiogram has been performed.  Melinda Payne M 02/22/2022, 9:04 AM

## 2022-03-04 NOTE — Patient Instructions (Addendum)
Depo-Medrol 80 mg IM given in office today.  Take Zithromax Z-PAK 2 tabs day 1 followed by 1 tab days 2 through 5.  Rest and stay well-hydrated.  May take Tessalon Perles if needed for cough.  Respiratory virus panel obtained and result is negative.

## 2022-03-06 ENCOUNTER — Other Ambulatory Visit: Payer: Self-pay | Admitting: Internal Medicine

## 2022-03-14 DIAGNOSIS — Z1231 Encounter for screening mammogram for malignant neoplasm of breast: Secondary | ICD-10-CM | POA: Diagnosis not present

## 2022-03-14 LAB — HM MAMMOGRAPHY

## 2022-03-16 ENCOUNTER — Encounter: Payer: Self-pay | Admitting: Internal Medicine

## 2022-03-27 DIAGNOSIS — M85851 Other specified disorders of bone density and structure, right thigh: Secondary | ICD-10-CM | POA: Diagnosis not present

## 2022-03-27 LAB — HM DEXA SCAN

## 2022-03-28 ENCOUNTER — Encounter: Payer: Self-pay | Admitting: Internal Medicine

## 2022-04-15 ENCOUNTER — Other Ambulatory Visit: Payer: Self-pay | Admitting: Internal Medicine

## 2022-04-17 ENCOUNTER — Ambulatory Visit: Payer: Self-pay

## 2022-04-17 NOTE — Patient Instructions (Signed)
Visit Information  Thank you for taking time to visit with me today. Please don't hesitate to contact me if I can be of assistance to you.   Following are the goals we discussed today:   Goals Addressed             This Visit's Progress    COMPLETED: Care Coordination Activities       Care Coordination Interventions: SDoH screening performed - no acute resource needs identified at this time Discussed patient does not have difficulty affording medications Performed chart review to note patient has completed Advance Directive packet Educated patient on services offered by the care coordination team - no follow up desired at this time Encouraged the patient to contact her primary care provider as needed         If you are experiencing a Mental Health or [place] or need someone to talk to, please call 911  Patient verbalizes understanding of instructions and care plan provided today and agrees to view in [place]. Active MyChart status and patient understanding of how to access instructions and care plan via MyChart confirmed with patient.     No further follow up required: Please contact your primary care provider as needed.  Daneen Schick, BSW, CDP Social Worker, Certified Dementia Practitioner [facility]  Care Coordination [phone]

## 2022-04-17 NOTE — Patient Outreach (Signed)
  Care Coordination   Initial Visit Note   04/17/2022 Name: Melinda Payne MRN: [id number] DOB: 08/27/1940  Melinda Payne is a 82 y.o. year old female who sees Baxley, Cresenciano Lick, MD for primary care. I spoke with  [name] by phone today.  What matters to the patients health and wellness today?  No concerns, doing well at this time.    Goals Addressed             This Visit's Progress    COMPLETED: Care Coordination Activities       Care Coordination Interventions: SDoH screening performed - no acute resource needs identified at this time Discussed patient does not have difficulty affording medications Performed chart review to note patient has completed Advance Directive packet Educated patient on services offered by the care coordination team - no follow up desired at this time Encouraged the patient to contact her primary care provider as needed         SDOH assessments and interventions completed:  Yes  SDOH Interventions Today    Flowsheet Row Most Recent Value  SDOH Interventions   Food Insecurity Interventions Intervention Not Indicated  Housing Interventions Intervention Not Indicated  Transportation Interventions Intervention Not Indicated  Utilities Interventions Intervention Not Indicated  Financial Strain Interventions Intervention Not Indicated        Care Coordination Interventions:  Yes, provided   Interventions Today    Flowsheet Row Most Recent Value  Chronic Disease Discussed/Reviewed   Chronic disease discussed/reviewed during today's visit Hypertension (HTN), Chronic Kidney Disease/End Stage Renal Disease (ESRD)  General Interventions   General Interventions Discussed/Reviewed General Interventions Discussed  Education Interventions   Education Provided Provided Verbal Education  Provided Verbal Education On --  [employer] Coordination Services]  Advanced Directive Interventions   Advanced Directives Discussed/Reviewed Advanced  Directives Reviewed       Follow up plan: No further intervention required. Patient is encouraged to contact SW as needed.    Encounter Outcome:  Pt. Visit Completed   Daneen Schick, BSW, CDP Social Worker, Certified Dementia Practitioner [facility]  Care Coordination [phone]

## 2022-05-10 DIAGNOSIS — D3132 Benign neoplasm of left choroid: Secondary | ICD-10-CM | POA: Diagnosis not present

## 2022-05-10 DIAGNOSIS — Z961 Presence of intraocular lens: Secondary | ICD-10-CM | POA: Diagnosis not present

## 2022-05-12 ENCOUNTER — Other Ambulatory Visit: Payer: Self-pay | Admitting: Internal Medicine

## 2022-05-29 ENCOUNTER — Telehealth: Payer: Self-pay | Admitting: Internal Medicine

## 2022-05-29 NOTE — Telephone Encounter (Signed)
Patient called and said she has been having bad indigestion and was told not to take tums so she wanted to know what she can take. Please advise. Call back is [phone]

## 2022-05-29 NOTE — Telephone Encounter (Signed)
Patient informed of message.  She stated she is not burping up acid and occasionally has some back discomfort with the indigestion.  She is going to try the Zantac for a few days and will call the office if indigestion continues.

## 2022-06-12 ENCOUNTER — Other Ambulatory Visit: Payer: Medicare HMO

## 2022-06-12 DIAGNOSIS — E039 Hypothyroidism, unspecified: Secondary | ICD-10-CM | POA: Diagnosis not present

## 2022-06-13 ENCOUNTER — Encounter: Payer: Self-pay | Admitting: Internal Medicine

## 2022-06-13 ENCOUNTER — Ambulatory Visit (INDEPENDENT_AMBULATORY_CARE_PROVIDER_SITE_OTHER): Payer: Medicare HMO | Admitting: Internal Medicine

## 2022-06-13 VITALS — BP 126/82 | HR 61 | Temp 98.1°F | Ht 60.75 in | Wt 147.4 lb

## 2022-06-13 DIAGNOSIS — Z820 Family history of epilepsy and other diseases of the nervous system: Secondary | ICD-10-CM

## 2022-06-13 DIAGNOSIS — E039 Hypothyroidism, unspecified: Secondary | ICD-10-CM | POA: Diagnosis not present

## 2022-06-13 DIAGNOSIS — I10 Essential (primary) hypertension: Secondary | ICD-10-CM

## 2022-06-13 DIAGNOSIS — M8580 Other specified disorders of bone density and structure, unspecified site: Secondary | ICD-10-CM | POA: Diagnosis not present

## 2022-06-13 LAB — TSH: TSH: 0.85 mIU/L (ref 0.40–4.50)

## 2022-06-13 NOTE — Progress Notes (Signed)
Patient Care Team: Margaree Mackintosh, MD as PCP - General (Internal Medicine)  Visit Date: 06/13/22  Subjective:    Patient ID: Melinda Payne , Female   DOB: 1940/05/30, 82 y.o.    MRN: [id number]   82 y.o. Female presents today to discuss TSH results. In December TSH was 6.91. thyroid med dose was increased to 0.088 mg from 0.075 mcg daily and she is here for follow up.  History of hypothyroidism. Reports compliance with levothyroxine 88 mcg daily. TSH  normal  at 0.85 on 06/12/22. Reports feeling generally well.  History of hypertension treated with hydrochlorothiazide 25 mg daily. Blood pressure normal today at 126/82.  History of osteopenia. 03/27/22 DEXA scan showed T-score at -1.2 in right femoral neck.  Past Medical History:  Diagnosis Date   Anxiety    Arthritis    back   Cancer 1997   melanoma rt neck   Carotid artery stenosis    Chronic kidney disease    stage renal disease   Counseling for estrogen replacement therapy    Fibrocystic breast disease    Hypertension    Hypothyroidism    Insomnia    Spinal stenosis    Vitamin D deficiency      Family History  Problem Relation Age of Onset   Alcohol abuse Father    Alzheimer's disease Brother    Dementia Brother    Alzheimer's disease Sister    Dementia Sister     Social History   Social History Narrative   Not on file      Review of Systems  Constitutional:  Negative for fever and malaise/fatigue.  HENT:  Negative for congestion.   Eyes:  Negative for blurred vision.  Respiratory:  Negative for cough and shortness of breath.   Cardiovascular:  Negative for chest pain, palpitations and leg swelling.  Gastrointestinal:  Negative for vomiting.  Musculoskeletal:  Negative for back pain.  Skin:  Negative for rash.  Neurological:  Negative for loss of consciousness and headaches.        Objective:   Vitals: BP 126/82   Pulse 61   Temp 98.1 F (36.7 C) (Tympanic)   Ht 5' 0.75" (1.543 m)    Wt 147 lb 6.4 oz (66.9 kg)   SpO2 98%   BMI 28.08 kg/m    Physical Exam Vitals and nursing note reviewed.  Constitutional:      General: She is not in acute distress.    Appearance: Normal appearance. She is not toxic-appearing.  HENT:     Head: Normocephalic and atraumatic.  Cardiovascular:     Rate and Rhythm: Normal rate and regular rhythm. No extrasystoles are present.    Pulses: Normal pulses.     Heart sounds: Normal heart sounds. No murmur heard.    No friction rub. No gallop.  Pulmonary:     Effort: Pulmonary effort is normal. No respiratory distress.     Breath sounds: Normal breath sounds. No wheezing or rales.  Skin:    General: Skin is warm and dry.  Neurological:     Mental Status: She is alert and oriented to person, place, and time. Mental status is at baseline.  Psychiatric:        Mood and Affect: Mood normal.        Behavior: Behavior normal.        Thought Content: Thought content normal.        Judgment: Judgment normal.      Results:  Studies obtained and personally reviewed by me:   Labs:       Component Value Date/Time   NA 140 02/14/2022 0916   NA 138 11/09/2021 0000   K 4.6 02/14/2022 0916   CL 97 (L) 02/14/2022 0916   CO2 33 (H) 02/14/2022 0916   GLUCOSE 88 02/14/2022 0916   BUN 22 02/14/2022 0916   BUN 19 11/09/2021 0000   CREATININE 1.24 (H) 02/14/2022 0916   CALCIUM 9.8 02/14/2022 0916   PROT 6.9 02/14/2022 0916   ALBUMIN 4.6 11/09/2021 0000   AST 15 02/14/2022 0916   ALT 12 02/14/2022 0916   ALKPHOS 58 11/26/2019 1000   BILITOT 0.7 02/14/2022 0916   GFRNONAA 38 (L) 07/06/2020 1229   GFRAA 45 (L) 07/06/2020 1229     Lab Results  Component Value Date   WBC 7.4 02/14/2022   HGB 13.3 02/14/2022   HCT 38.8 02/14/2022   MCV 93.0 02/14/2022   PLT 168 02/14/2022    Lab Results  Component Value Date   CHOL 202 (H) 02/14/2022   HDL 94 02/14/2022   LDLCALC 92 02/14/2022   TRIG 70 02/14/2022   CHOLHDL 2.1 02/14/2022     Lab Results  Component Value Date   HGBA1C 5.5 06/29/2014     Lab Results  Component Value Date   TSH 0.85 06/12/2022      Assessment & Plan:   Hypothyroidism: continue with levothyroxine 88 mcg daily. TSH low at 0.85 on 06/12/22.   Hypertension: treated with hydrochlorothiazide 25 mg daily. Blood pressure normal today at 126/82.  Osteopenia: 03/27/22 DEXA scan showed T-score at -1.2 in right femoral neck.. Continue to monitor.  Memory loss- familial  as has several sibs with dementia mild and stable. Takes generic Aricept  Medicare wellness and CPE booked for December 2024.  I,Alexander Ruley,acting as a Neurosurgeon for Margaree Mackintosh, MD.,have documented all relevant documentation on the behalf of Margaree Mackintosh, MD,as directed by  Margaree Mackintosh, MD while in the presence of Margaree Mackintosh, MD.   I, Margaree Mackintosh, MD, have reviewed all documentation for this visit. The documentation on 07/01/22 for the exam, diagnosis, procedures, and orders are all accurate and complete.

## 2022-07-06 ENCOUNTER — Telehealth: Payer: Self-pay | Admitting: Internal Medicine

## 2022-07-06 ENCOUNTER — Ambulatory Visit (INDEPENDENT_AMBULATORY_CARE_PROVIDER_SITE_OTHER): Payer: Medicare HMO | Admitting: Internal Medicine

## 2022-07-06 ENCOUNTER — Encounter: Payer: Self-pay | Admitting: Internal Medicine

## 2022-07-06 VITALS — BP 124/70 | HR 69 | Temp 98.5°F | Ht 60.75 in | Wt 147.0 lb

## 2022-07-06 DIAGNOSIS — J329 Chronic sinusitis, unspecified: Secondary | ICD-10-CM

## 2022-07-06 DIAGNOSIS — J0101 Acute recurrent maxillary sinusitis: Secondary | ICD-10-CM | POA: Diagnosis not present

## 2022-07-06 MED ORDER — AMOXICILLIN-POT CLAVULANATE 500-125 MG PO TABS: 1.0000 | ORAL_TABLET | Freq: Three times a day (TID) | ORAL | 10 refills | Status: DC

## 2022-07-06 MED ORDER — METHYLPREDNISOLONE 4 MG PO TABS: ORAL_TABLET | ORAL | 0 refills | Status: DC

## 2022-07-06 NOTE — Progress Notes (Signed)
Patient Care Team: Margaree Mackintosh, MD as PCP - General (Internal Medicine)  Visit Date: 07/06/22  Subjective:    Patient ID: Melinda Payne , Female   DOB: 07/01/1940, 82 y.o.    MRN: [id number]   82 y.o. Female presents today for cough with green/brown sputum since 07/01/22. Taking OTC medication without relief. Treated in 02/2022 for acute maxillary sinusitis with Medrol, Augmentin.   Past Medical History:  Diagnosis Date   Anxiety    Arthritis    back   Cancer (HCC) 1997   melanoma rt neck   Carotid artery stenosis    Chronic kidney disease    stage renal disease   Counseling for estrogen replacement therapy    Fibrocystic breast disease    Hypertension    Hypothyroidism    Insomnia    Spinal stenosis    Vitamin D deficiency      Family History  Problem Relation Age of Onset   Alcohol abuse Father    Alzheimer's disease Brother    Dementia Brother    Alzheimer's disease Sister    Dementia Sister     Social History   Social History Narrative   Not on file      Review of Systems  Constitutional:  Negative for fever and malaise/fatigue.  HENT:  Negative for congestion.   Eyes:  Negative for blurred vision.  Respiratory:  Positive for cough and sputum production (Green/brown). Negative for shortness of breath.   Cardiovascular:  Negative for chest pain, palpitations and leg swelling.  Gastrointestinal:  Negative for vomiting.  Musculoskeletal:  Negative for back pain.  Skin:  Negative for rash.  Neurological:  Negative for loss of consciousness and headaches.        Objective:   Vitals: BP 124/70   Pulse 69   Temp 98.5 F (36.9 C) (Tympanic)   Ht 5' 0.75" (1.543 m)   Wt 147 lb (66.7 kg)   SpO2 99%   BMI 28.00 kg/m    Physical Exam Vitals and nursing note reviewed.  Constitutional:      General: She is not in acute distress.    Appearance: Normal appearance. She is not toxic-appearing.  HENT:     Head: Normocephalic and atraumatic.      Right Ear: Hearing, tympanic membrane, ear canal and external ear normal.     Left Ear: Hearing, tympanic membrane, ear canal and external ear normal.  Pulmonary:     Effort: Pulmonary effort is normal. No respiratory distress.     Breath sounds: Normal breath sounds. No wheezing or rales.  Skin:    General: Skin is warm and dry.  Neurological:     Mental Status: She is alert and oriented to person, place, and time. Mental status is at baseline.  Psychiatric:        Mood and Affect: Mood normal.        Behavior: Behavior normal.        Thought Content: Thought content normal.        Judgment: Judgment normal.       Results:   Studies obtained and personally reviewed by me:    Labs:       Component Value Date/Time   NA 140 02/14/2022 0916   NA 138 11/09/2021 0000   K 4.6 02/14/2022 0916   CL 97 (L) 02/14/2022 0916   CO2 33 (H) 02/14/2022 0916   GLUCOSE 88 02/14/2022 0916   BUN 22 02/14/2022 0916  BUN 19 11/09/2021 0000   CREATININE 1.24 (H) 02/14/2022 0916   CALCIUM 9.8 02/14/2022 0916   PROT 6.9 02/14/2022 0916   ALBUMIN 4.6 11/09/2021 0000   AST 15 02/14/2022 0916   ALT 12 02/14/2022 0916   ALKPHOS 58 11/26/2019 1000   BILITOT 0.7 02/14/2022 0916   GFRNONAA 38 (L) 07/06/2020 1229   GFRAA 45 (L) 07/06/2020 1229     Lab Results  Component Value Date   WBC 7.4 02/14/2022   HGB 13.3 02/14/2022   HCT 38.8 02/14/2022   MCV 93.0 02/14/2022   PLT 168 02/14/2022    Lab Results  Component Value Date   CHOL 202 (H) 02/14/2022   HDL 94 02/14/2022   LDLCALC 92 02/14/2022   TRIG 70 02/14/2022   CHOLHDL 2.1 02/14/2022    Lab Results  Component Value Date   HGBA1C 5.5 06/29/2014     Lab Results  Component Value Date   TSH 0.85 06/12/2022      Assessment & Plan:   Acute maxillary sinusitis: prescribed Augmentin 500-125 mg 3 times daily for 10 days, Medrol dosepak tapering course take as directed. Contact us if symptoms are not  improving.    I,Alexander Ruley,acting as a Neurosurgeon for Margaree Mackintosh, MD.,have documented all relevant documentation on the behalf of Margaree Mackintosh, MD,as directed by  Margaree Mackintosh, MD while in the presence of Margaree Mackintosh, MD.   I, Margaree Mackintosh, MD, have reviewed all documentation for this visit. The documentation on 07/19/22 for the exam, diagnosis, procedures, and orders are all accurate and complete.

## 2022-07-06 NOTE — Telephone Encounter (Signed)
Lib Capwell [phone]  Lib called to say she has a cough and some brownish, green mucus since Saturday, she has tried over the counter medication and she is not getting any better. I scheduled her for 12:00 today

## 2022-07-19 DIAGNOSIS — J329 Chronic sinusitis, unspecified: Secondary | ICD-10-CM | POA: Insufficient documentation

## 2022-07-19 NOTE — Patient Instructions (Signed)
Augmentin 500 mg 3 times a day for 10 days.  Take Medrol Dosepak and tapering course as directed.  Contact us if symptoms not improving within 7 to 10 days.

## 2022-07-29 ENCOUNTER — Other Ambulatory Visit: Payer: Self-pay | Admitting: Internal Medicine

## 2022-08-10 ENCOUNTER — Other Ambulatory Visit: Payer: Self-pay

## 2022-08-10 MED ORDER — LEVOTHYROXINE SODIUM 88 MCG PO TABS: 88.0000 ug | ORAL_TABLET | Freq: Every day | ORAL | 1 refills | Status: DC

## 2022-09-11 ENCOUNTER — Other Ambulatory Visit: Payer: Self-pay

## 2022-09-11 MED ORDER — SIMVASTATIN 10 MG PO TABS: 10.0000 mg | ORAL_TABLET | Freq: Every day | ORAL | 3 refills | Status: DC

## 2022-09-14 ENCOUNTER — Other Ambulatory Visit: Payer: Self-pay

## 2022-09-14 MED ORDER — DONEPEZIL HCL 5 MG PO TABS: 5.0000 mg | ORAL_TABLET | Freq: Every day | ORAL | 2 refills | Status: DC

## 2022-09-18 ENCOUNTER — Other Ambulatory Visit: Payer: Self-pay

## 2022-09-18 MED ORDER — HYDROCHLOROTHIAZIDE 25 MG PO TABS: 25.0000 mg | ORAL_TABLET | Freq: Every day | ORAL | 1 refills | Status: DC

## 2022-10-17 ENCOUNTER — Other Ambulatory Visit: Payer: Self-pay

## 2022-10-17 DIAGNOSIS — L821 Other seborrheic keratosis: Secondary | ICD-10-CM | POA: Diagnosis not present

## 2022-10-17 DIAGNOSIS — D225 Melanocytic nevi of trunk: Secondary | ICD-10-CM | POA: Diagnosis not present

## 2022-10-17 DIAGNOSIS — L570 Actinic keratosis: Secondary | ICD-10-CM | POA: Diagnosis not present

## 2022-10-17 DIAGNOSIS — L565 Disseminated superficial actinic porokeratosis (DSAP): Secondary | ICD-10-CM | POA: Diagnosis not present

## 2022-10-17 DIAGNOSIS — Z85820 Personal history of malignant melanoma of skin: Secondary | ICD-10-CM | POA: Diagnosis not present

## 2022-10-17 DIAGNOSIS — Z85828 Personal history of other malignant neoplasm of skin: Secondary | ICD-10-CM | POA: Diagnosis not present

## 2022-10-17 MED ORDER — ALPRAZOLAM 1 MG PO TABS: ORAL_TABLET | ORAL | 1 refills | Status: DC

## 2022-10-17 NOTE — Telephone Encounter (Signed)
Last refill 04/15/22 #90 + 1 refill.

## 2022-10-19 ENCOUNTER — Telehealth: Payer: Self-pay | Admitting: Internal Medicine

## 2022-10-19 ENCOUNTER — Encounter: Payer: Self-pay | Admitting: Internal Medicine

## 2022-10-19 ENCOUNTER — Other Ambulatory Visit: Payer: Self-pay | Admitting: Internal Medicine

## 2022-10-19 MED ORDER — ALPRAZOLAM 0.25 MG PO TABS: 0.2500 mg | ORAL_TABLET | Freq: Two times a day (BID) | ORAL | 0 refills | Status: DC | PRN

## 2022-10-19 NOTE — Telephone Encounter (Signed)
Patient said she is taking it twice a day as needed she said not every day only when she needs it.

## 2022-10-19 NOTE — Telephone Encounter (Signed)
Sending in small quantity of Xanax 0.25 mg to take sparingly in addition to the 1 mg dose that she takes at bedtime. MJB, MD

## 2022-10-19 NOTE — Telephone Encounter (Signed)
Last refill 04/15/22 #90 + 1 refill.

## 2022-10-19 NOTE — Telephone Encounter (Signed)
Received Fax RX request from  Pharmacy -  [facility] [id number] [place], [place] - [address] Phone: [phone]  Fax: [phone]      Medication - ALPRAZolam Prudy Feeler) 0.25 MG tablet   Last Refill - 08/01/2022  Last OV - 07/06/2022  Last CPE - 02/17/2022  Next Appointment - 02/21/2023

## 2022-11-13 ENCOUNTER — Other Ambulatory Visit: Payer: Self-pay | Admitting: Internal Medicine

## 2022-11-13 MED ORDER — ALPRAZOLAM 0.25 MG PO TABS: 0.2500 mg | ORAL_TABLET | Freq: Two times a day (BID) | ORAL | 1 refills | Status: DC | PRN

## 2022-11-13 NOTE — Telephone Encounter (Signed)
Patient called about refill for ALPRAZolam Prudy Feeler) 0.25 MG tablet and wanted to know if it can be refilled for a 90d/s

## 2022-11-22 DIAGNOSIS — N2581 Secondary hyperparathyroidism of renal origin: Secondary | ICD-10-CM | POA: Diagnosis not present

## 2022-11-22 DIAGNOSIS — D631 Anemia in chronic kidney disease: Secondary | ICD-10-CM | POA: Diagnosis not present

## 2022-11-22 DIAGNOSIS — I129 Hypertensive chronic kidney disease with stage 1 through stage 4 chronic kidney disease, or unspecified chronic kidney disease: Secondary | ICD-10-CM | POA: Diagnosis not present

## 2022-11-22 DIAGNOSIS — N189 Chronic kidney disease, unspecified: Secondary | ICD-10-CM | POA: Diagnosis not present

## 2022-11-22 DIAGNOSIS — N183 Chronic kidney disease, stage 3 unspecified: Secondary | ICD-10-CM | POA: Diagnosis not present

## 2022-11-23 ENCOUNTER — Telehealth: Payer: Self-pay | Admitting: Internal Medicine

## 2022-11-23 LAB — LAB REPORT - SCANNED: EGFR: 49

## 2022-11-23 NOTE — Telephone Encounter (Signed)
scheduled

## 2022-11-23 NOTE — Telephone Encounter (Signed)
Melinda Payne [phone]  [name] called to see if she can come in and talk to you, she does not feel the medication she on is helping, she is under a lot of stress with her husband being in facility.

## 2022-11-27 ENCOUNTER — Encounter: Payer: Self-pay | Admitting: Internal Medicine

## 2022-11-27 ENCOUNTER — Ambulatory Visit (INDEPENDENT_AMBULATORY_CARE_PROVIDER_SITE_OTHER): Payer: Medicare HMO | Admitting: Internal Medicine

## 2022-11-27 VITALS — BP 140/80 | HR 71 | Ht 60.75 in | Wt 137.0 lb

## 2022-11-27 DIAGNOSIS — F439 Reaction to severe stress, unspecified: Secondary | ICD-10-CM

## 2022-11-27 DIAGNOSIS — G4700 Insomnia, unspecified: Secondary | ICD-10-CM | POA: Diagnosis not present

## 2022-11-27 DIAGNOSIS — F419 Anxiety disorder, unspecified: Secondary | ICD-10-CM | POA: Diagnosis not present

## 2022-11-27 MED ORDER — ALPRAZOLAM 0.5 MG PO TABS: ORAL_TABLET | ORAL | 0 refills | Status: DC

## 2022-11-27 NOTE — Progress Notes (Signed)
Subjective:    Patient ID: Melinda Payne, female    DOB: Oct 11, 1940, 82 y.o.   MRN: [id number]  HPI 82 year old Female is here today to discuss situational stress with husband who is in a long term nursing facility. Hopes to be moved to memory care at [facility] in the near future. Patient says her anxiety, which is longstanding, has gotten worse.Xanax has worked well for her. We talked about adding an SSRI anti-depressant that would treat anxiety symptoms such as Paxil but we have decided to adjust her Xanax instead. Have recommended counseling for her.Clearly feeling overwhelmed.    Review of Systems see above.     Objective:   Physical Exam BP 140/80. Patient is slightly anxious. Pulse 71 and regular, pulse ox 99%, weight 137lb BMI 26.10  She is sad about her husband and the situation he is in. He does not understand why he is in longterm care.     Assessment & Plan:   Anxiety state Situational stress  Plan: Adjust Xanax 0.5 mg tablets as follows: Take 1 tablet in the morning, 1 tablet in the mid afternoon and 2 tablets at bedtime.  Counseling for stress advised and information given.

## 2022-11-27 NOTE — Patient Instructions (Addendum)
Recommend Xanax  0.5 mg tablets to take one tablet in the morning, one tablet in the mid afternoon and 2 tabs at bedtime. RTC in 4 weeks . Counseling for stress advised and info given.

## 2022-12-15 ENCOUNTER — Telehealth: Payer: Self-pay | Admitting: Internal Medicine
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
## 2022-12-28 ENCOUNTER — Other Ambulatory Visit: Payer: Self-pay | Admitting: Internal Medicine

## 2022-12-28 MED ORDER — ALPRAZOLAM 0.5 MG PO TABS: ORAL_TABLET | ORAL | 0 refills | Status: DC

## 2022-12-28 NOTE — Telephone Encounter (Signed)
Patient called and said the pharmacy told her they had sent over a refill request for ALPRAZolam Prudy Feeler) 0.5 MG tablet but hasn't heard anything back. [facility] [id number] - [place], [place] - [address]

## 2023-01-29 ENCOUNTER — Other Ambulatory Visit: Payer: Self-pay

## 2023-01-29 MED ORDER — ALPRAZOLAM 0.5 MG PO TABS: ORAL_TABLET | ORAL | 0 refills | Status: DC

## 2023-01-30 DIAGNOSIS — Z008 Encounter for other general examination: Secondary | ICD-10-CM | POA: Diagnosis not present

## 2023-02-09 ENCOUNTER — Other Ambulatory Visit: Payer: Self-pay

## 2023-02-09 MED ORDER — LEVOTHYROXINE SODIUM 88 MCG PO TABS: 88.0000 ug | ORAL_TABLET | Freq: Every day | ORAL | 1 refills | Status: DC

## 2023-02-19 ENCOUNTER — Other Ambulatory Visit: Payer: Medicare HMO

## 2023-02-19 DIAGNOSIS — M8580 Other specified disorders of bone density and structure, unspecified site: Secondary | ICD-10-CM

## 2023-02-19 DIAGNOSIS — N1831 Chronic kidney disease, stage 3a: Secondary | ICD-10-CM

## 2023-02-19 DIAGNOSIS — I10 Essential (primary) hypertension: Secondary | ICD-10-CM

## 2023-02-19 DIAGNOSIS — E7800 Pure hypercholesterolemia, unspecified: Secondary | ICD-10-CM

## 2023-02-19 DIAGNOSIS — Z0000 Encounter for general adult medical examination without abnormal findings: Secondary | ICD-10-CM

## 2023-02-19 DIAGNOSIS — R011 Cardiac murmur, unspecified: Secondary | ICD-10-CM

## 2023-02-19 DIAGNOSIS — E039 Hypothyroidism, unspecified: Secondary | ICD-10-CM
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
## 2023-02-20 ENCOUNTER — Ambulatory Visit: Payer: Medicare HMO | Admitting: Internal Medicine

## 2023-02-20 LAB — LIPID PANEL
Cholesterol: 197 mg/dL (ref <200)
HDL: 89 mg/dL (ref >=50)
LDL Cholesterol (Calc): 93 mg/dL
Non-HDL Cholesterol (Calc): 108 mg/dL (ref <130)
Total CHOL/HDL Ratio: 2.2 (calc) (ref <5.0)
Triglycerides: 64 mg/dL (ref <150)

## 2023-02-20 LAB — COMPLETE METABOLIC PANEL WITH GFR
AG Ratio: 1.9 (calc) (ref 1.0–2.5)
ALT: 8 U/L (ref 6–29)
AST: 16 U/L (ref 10–35)
Albumin: 4.6 g/dL (ref 3.6–5.1)
Alkaline phosphatase (APISO): 69 U/L (ref 37–153)
BUN/Creatinine Ratio: 12 (calc) (ref 6–22)
BUN: 14 mg/dL (ref 7–25)
CO2: 33 mmol/L — ABNORMAL HIGH (ref 20–32)
Calcium: 10 mg/dL (ref 8.6–10.4)
Chloride: 98 mmol/L (ref 98–110)
Creat: 1.21 mg/dL — ABNORMAL HIGH (ref 0.60–0.95)
Globulin: 2.4 g/dL (ref 1.9–3.7)
Glucose, Bld: 97 mg/dL (ref 65–99)
Potassium: 4.5 mmol/L (ref 3.5–5.3)
Sodium: 140 mmol/L (ref 135–146)
Total Bilirubin: 0.8 mg/dL (ref 0.2–1.2)
Total Protein: 7 g/dL (ref 6.1–8.1)
eGFR: 45 mL/min/{1.73_m2} — ABNORMAL LOW (ref >=60)

## 2023-02-20 LAB — CBC WITH DIFFERENTIAL/PLATELET
Absolute Lymphocytes: 2126 {cells}/uL (ref 850–3900)
Absolute Monocytes: 413 {cells}/uL (ref 200–950)
Basophils Absolute: 10 {cells}/uL (ref 0–200)
Basophils Relative: 0.2 %
Eosinophils Absolute: 19 {cells}/uL (ref 15–500)
Eosinophils Relative: 0.4 %
HCT: 38.5 % (ref 35.0–45.0)
Hemoglobin: 12.7 g/dL (ref 11.7–15.5)
MCH: 31.5 pg (ref 27.0–33.0)
MCHC: 33 g/dL (ref 32.0–36.0)
MCV: 95.5 fL (ref 80.0–100.0)
MPV: 10.3 fL (ref 7.5–12.5)
Monocytes Relative: 8.6 %
Neutro Abs: 2232 {cells}/uL (ref 1500–7800)
Neutrophils Relative %: 46.5 %
Platelets: 153 10*3/uL (ref 140–400)
RBC: 4.03 10*6/uL (ref 3.80–5.10)
RDW: 11.4 % (ref 11.0–15.0)
Total Lymphocyte: 44.3 %
WBC: 4.8 10*3/uL (ref 3.8–10.8)

## 2023-02-20 LAB — TSH: TSH: 1.67 m[IU]/L (ref 0.40–4.50)

## 2023-02-21 ENCOUNTER — Ambulatory Visit: Payer: Medicare HMO | Admitting: Internal Medicine

## 2023-02-21 ENCOUNTER — Encounter: Payer: Self-pay | Admitting: Internal Medicine

## 2023-02-21 VITALS — BP 130/80 | HR 68 | Ht 60.75 in | Wt 134.0 lb

## 2023-02-21 DIAGNOSIS — I10 Essential (primary) hypertension: Secondary | ICD-10-CM | POA: Diagnosis not present

## 2023-02-21 DIAGNOSIS — M8580 Other specified disorders of bone density and structure, unspecified site: Secondary | ICD-10-CM

## 2023-02-21 DIAGNOSIS — E039 Hypothyroidism, unspecified: Secondary | ICD-10-CM

## 2023-02-21 DIAGNOSIS — Z0000 Encounter for general adult medical examination without abnormal findings: Secondary | ICD-10-CM | POA: Diagnosis not present

## 2023-02-21 DIAGNOSIS — F411 Generalized anxiety disorder: Secondary | ICD-10-CM

## 2023-02-21 DIAGNOSIS — Z9889 Other specified postprocedural states: Secondary | ICD-10-CM

## 2023-02-21 DIAGNOSIS — Z85820 Personal history of malignant melanoma of skin: Secondary | ICD-10-CM

## 2023-02-21 DIAGNOSIS — Z820 Family history of epilepsy and other diseases of the nervous system: Secondary | ICD-10-CM | POA: Diagnosis not present

## 2023-02-21 DIAGNOSIS — N1831 Chronic kidney disease, stage 3a: Secondary | ICD-10-CM | POA: Diagnosis not present

## 2023-02-21 DIAGNOSIS — H903 Sensorineural hearing loss, bilateral: Secondary | ICD-10-CM

## 2023-02-21 DIAGNOSIS — E7800 Pure hypercholesterolemia, unspecified: Secondary | ICD-10-CM

## 2023-02-21 DIAGNOSIS — Z8542 Personal history of malignant neoplasm of other parts of uterus: Secondary | ICD-10-CM

## 2023-02-21 LAB — POCT URINALYSIS DIP (CLINITEK)
Bilirubin, UA: NEGATIVE
Blood, UA: NEGATIVE
Glucose, UA: NEGATIVE mg/dL
Ketones, POC UA: NEGATIVE mg/dL
Leukocytes, UA: NEGATIVE
Nitrite, UA: NEGATIVE
POC PROTEIN,UA: NEGATIVE
Spec Grav, UA: 1.01 (ref 1.010–1.025)
Urobilinogen, UA: 0.2 U/dL
pH, UA: 6.5 (ref 5.0–8.0)
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
## 2023-02-21 NOTE — Patient Instructions (Addendum)
Next appointment: Follow up in one year for your annual wellness visit    Preventive Care 80 Years and Older, Female Preventive care refers to lifestyle choices and visits with your health care provider that can promote health and wellness. What does preventive care include? A yearly physical exam. This is also called an annual well check. Dental exams once or twice a year. Routine eye exams. Ask your health care provider how often you should have your eyes checked. Personal lifestyle choices, including: Daily care of your teeth and gums. Regular physical activity. Eating a healthy diet. Avoiding tobacco and drug use. Limiting alcohol use. Practicing safe sex. Taking low-dose aspirin every day. Taking vitamin and mineral supplements as recommended by your health care provider. What happens during an annual well check? The services and screenings done by your health care provider during your annual well check will depend on your age, overall health, lifestyle risk factors, and family history of disease. Counseling  Your health care provider may ask you questions about your: Alcohol use. Tobacco use. Drug use. Emotional well-being. Home and relationship well-being. Sexual activity. Eating habits. History of falls. Memory and ability to understand (cognition). Work and work Astronomer. Reproductive health. Screening  You may have the following tests or measurements: Height, weight, and BMI. Blood pressure. Lipid and cholesterol levels. These may be checked every 5 years, or more frequently if you are over 40 years old. Skin check. Lung cancer screening. You may have this screening every year starting at age 58 if you have a 30-pack-year history of smoking and currently smoke or have quit within the past 15 years. Fecal occult blood test (FOBT) of the stool. You may have this test every year starting at age 62. Flexible sigmoidoscopy or colonoscopy. You may have a  sigmoidoscopy every 5 years or a colonoscopy every 10 years starting at age 63. Hepatitis C blood test. Hepatitis B blood test. Sexually transmitted disease (STD) testing. Diabetes screening. This is done by checking your blood sugar (glucose) after you have not eaten for a while (fasting). You may have this done every 1-3 years. Bone density scan. This is done to screen for osteoporosis. You may have this done starting at age 32. Mammogram. This may be done every 1-2 years. Talk to your health care provider about how often you should have regular mammograms. Talk with your health care provider about your test results, treatment options, and if necessary, the need for more tests. Vaccines  Your health care provider may recommend certain vaccines, such as: Influenza vaccine. This is recommended every year. Tetanus, diphtheria, and acellular pertussis (Tdap, Td) vaccine. You may need a Td booster every 10 years. Zoster vaccine. You may need this after age 49. Pneumococcal 13-valent conjugate (PCV13) vaccine. One dose is recommended after age 54. Pneumococcal polysaccharide (PPSV23) vaccine. One dose is recommended after age 76. Talk to your health care provider about which screenings and vaccines you need and how often you need them. This information is not intended to replace advice given to you by your health care provider. Make sure you discuss any questions you have with your health care provider. Document Released: 03/19/2015 Document Revised: 11/10/2015 Document Reviewed: 12/22/2014 Elsevier Interactive Patient Education  2017 [employer].  Fall Prevention in the Home Falls can cause injuries. They can happen to people of all ages. There are many things you can do to make your home safe and to help prevent falls. What can I do on the outside of  my home? Regularly fix the edges of walkways and driveways and fix any cracks. Remove anything that might make you trip as you walk through a  door, such as a raised step or threshold. Trim any bushes or trees on the path to your home. Use bright outdoor lighting. Clear any walking paths of anything that might make someone trip, such as rocks or tools. Regularly check to see if handrails are loose or broken. Make sure that both sides of any steps have handrails. Any raised decks and porches should have guardrails on the edges. Have any leaves, snow, or ice cleared regularly. Use sand or salt on walking paths during winter. Clean up any spills in your garage right away. This includes oil or grease spills. What can I do in the bathroom? Use night lights. Install grab bars by the toilet and in the tub and shower. Do not use towel bars as grab bars. Use non-skid mats or decals in the tub or shower. If you need to sit down in the shower, use a plastic, non-slip stool. Keep the floor dry. Clean up any water that spills on the floor as soon as it happens. Remove soap buildup in the tub or shower regularly. Attach bath mats securely with double-sided non-slip rug tape. Do not have throw rugs and other things on the floor that can make you trip. What can I do in the bedroom? Use night lights. Make sure that you have a light by your bed that is easy to reach. Do not use any sheets or blankets that are too big for your bed. They should not hang down onto the floor. Have a firm chair that has side arms. You can use this for support while you get dressed. Do not have throw rugs and other things on the floor that can make you trip. What can I do in the kitchen? Clean up any spills right away. Avoid walking on wet floors. Keep items that you use a lot in easy-to-reach places. If you need to reach something above you, use a strong step stool that has a grab bar. Keep electrical cords out of the way. Do not use floor polish or wax that makes floors slippery. If you must use wax, use non-skid floor wax. Do not have throw rugs and other things  on the floor that can make you trip. What can I do with my stairs? Do not leave any items on the stairs. Make sure that there are handrails on both sides of the stairs and use them. Fix handrails that are broken or loose. Make sure that handrails are as long as the stairways. Check any carpeting to make sure that it is firmly attached to the stairs. Fix any carpet that is loose or worn. Avoid having throw rugs at the top or bottom of the stairs. If you do have throw rugs, attach them to the floor with carpet tape. Make sure that you have a light switch at the top of the stairs and the bottom of the stairs. If you do not have them, ask someone to add them for you. What else can I do to help prevent falls? Wear shoes that: Do not have high heels. Have rubber bottoms. Are comfortable and fit you well. Are closed at the toe. Do not wear sandals. If you use a stepladder: Make sure that it is fully opened. Do not climb a closed stepladder. Make sure that both sides of the stepladder are locked into place. Ask  someone to hold it for you, if possible. Clearly mark and make sure that you can see: Any grab bars or handrails. First and last steps. Where the edge of each step is. Use tools that help you move around (mobility aids) if they are needed. These include: Canes. Walkers. Scooters. Crutches. Turn on the lights when you go into a dark area. Replace any light bulbs as soon as they burn out. Set up your furniture so you have a clear path. Avoid moving your furniture around. If any of your floors are uneven, fix them. If there are any pets around you, be aware of where they are. Review your medicines with your doctor. Some medicines can make you feel dizzy. This can increase your chance of falling. Ask your doctor what other things that you can do to help prevent falls. This information is not intended to replace advice given to you by your health care provider. Make sure you discuss any  questions you have with your health care provider. Document Released: 12/17/2008 Document Revised: 07/29/2015 Document Reviewed: 03/27/2014 Elsevier Interactive Patient Education  2017 [employer].  Labs are stable.  She will continue current medications and follow-up in 6 months or as needed.

## 2023-02-26 ENCOUNTER — Other Ambulatory Visit: Payer: Self-pay | Admitting: Internal Medicine

## 2023-02-26 MED ORDER — ALPRAZOLAM 0.5 MG PO TABS: ORAL_TABLET | ORAL | 0 refills | Status: DC

## 2023-03-05 ENCOUNTER — Other Ambulatory Visit (HOSPITAL_BASED_OUTPATIENT_CLINIC_OR_DEPARTMENT_OTHER): Payer: Self-pay | Admitting: Internal Medicine

## 2023-03-05 DIAGNOSIS — Z1231 Encounter for screening mammogram for malignant neoplasm of breast: Secondary | ICD-10-CM

## 2023-03-21 ENCOUNTER — Encounter (HOSPITAL_BASED_OUTPATIENT_CLINIC_OR_DEPARTMENT_OTHER): Payer: Self-pay | Admitting: Radiology

## 2023-03-21 ENCOUNTER — Ambulatory Visit (HOSPITAL_BASED_OUTPATIENT_CLINIC_OR_DEPARTMENT_OTHER)
Admission: RE | Admit: 2023-03-21 | Discharge: 2023-03-21 | Disposition: A | Discharge status: Home / Self Care | Payer: Medicare HMO | Source: Ambulatory Visit | Attending: Internal Medicine | Admitting: Internal Medicine

## 2023-03-21 DIAGNOSIS — Z1231 Encounter for screening mammogram for malignant neoplasm of breast: Secondary | ICD-10-CM | POA: Diagnosis not present

## 2023-03-26 ENCOUNTER — Other Ambulatory Visit: Payer: Self-pay

## 2023-03-26 MED ORDER — ALPRAZOLAM 0.5 MG PO TABS: ORAL_TABLET | ORAL | 2 refills | Status: DC

## 2023-03-26 MED ORDER — HYDROCHLOROTHIAZIDE 25 MG PO TABS: 25.0000 mg | ORAL_TABLET | Freq: Every day | ORAL | 1 refills | Status: DC

## 2023-03-26 NOTE — Addendum Note (Signed)
Addended by: Gregery Na on: 03/26/2023 02:46 PM   Modules accepted: Orders

## 2023-05-15 ENCOUNTER — Ambulatory Visit: Admitting: Internal Medicine

## 2023-05-15 ENCOUNTER — Ambulatory Visit: Payer: Self-pay | Admitting: Internal Medicine

## 2023-05-15 ENCOUNTER — Telehealth (INDEPENDENT_AMBULATORY_CARE_PROVIDER_SITE_OTHER): Admitting: Internal Medicine

## 2023-05-15 DIAGNOSIS — R413 Other amnesia: Secondary | ICD-10-CM | POA: Diagnosis not present

## 2023-05-15 DIAGNOSIS — E7800 Pure hypercholesterolemia, unspecified: Secondary | ICD-10-CM | POA: Diagnosis not present

## 2023-05-15 DIAGNOSIS — E039 Hypothyroidism, unspecified: Secondary | ICD-10-CM | POA: Diagnosis not present

## 2023-05-15 DIAGNOSIS — Z634 Disappearance and death of family member: Secondary | ICD-10-CM

## 2023-05-15 DIAGNOSIS — F419 Anxiety disorder, unspecified: Secondary | ICD-10-CM

## 2023-05-15 NOTE — Telephone Encounter (Signed)
 Patient is requesting a stronger medication for anxiety her spouse passed away on Jun 09, 2023. Please advise.

## 2023-05-15 NOTE — Telephone Encounter (Signed)
 Scheduled a video visit at 4:30pm.

## 2023-05-15 NOTE — Patient Instructions (Addendum)
 May take Xanax as prescribed for anxiety, bereavement and grief.  Continue Aricept for memory loss.  Continue same dose of thyroid replacement.  Labs reviewed especially TSH.  Continue Zocor 10 mg daily for hyperlipidemia.  Try to get plenty of rest.  Take time with house chores and take care of yourself.

## 2023-05-15 NOTE — Telephone Encounter (Signed)
 Chief Complaint: Anxiety Symptoms: Restless Frequency: Since spouse passed on 2023/06/09 Pertinent Negatives: Patient denies difficulty breathing, chest pain, harming self Disposition: [] ED /[] Urgent Care (no appt availability in office) / [] Appointment(In office/virtual)/ []  [facility]/ [] Home Care/ [] Refused Recommended Disposition /[] [facility]/ [x]  Follow-up with PCP Additional Notes: Pt states her spouse passed on 06-09-23. Pt states she has felt restless during the day as she is waiting on his ashes. Pt states she does sleep well at night and has no thoughts of hurting herself. Pt wants to know if Dr. Lenord Fellers could prescribe something stronger for her anxity. Pt currently taking xanax 0.5 mg tablet- One tablet by mouth in the morning, one tablet in the early afternoon, and 2 tablets at bedtime. This RN educated pt on home care, new-worsening symptoms, when to call back/seek emergent care. Pt verbalized understanding and agrees to plan.   Copied from CRM [id number]. Topic: Clinical - Medical Advice >> May 15, 2023 11:49 AM Tiffany B wrote: Reason for CRM: Patient states her spouse passed away on 06/09/23 and would like to know if PCP would provide a stronger anxiety medication to get her through the process and funeral. Reason for Disposition . Patient sounds very upset or troubled to the triager  Answer Assessment - Initial Assessment Questions 1. CONCERN: "Did anything happen that prompted you to call today?"      Pt spouse passed away; hasn't gotten ashes back 2. ANXIETY SYMPTOMS: "Can you describe how you (your loved one; patient) have been feeling?" (e.g., tense, restless, panicky, anxious, keyed up, overwhelmed, sense of impending doom).      Overwhelmed 3. ONSET: "How long have you been feeling this way?" (e.g., hours, days, weeks)     09-Jun-2023 4. SEVERITY: "How would you rate the level of anxiety?" (e.g., 0 - 10; or mild, moderate, severe).     10  Protocols  used: Anxiety and Panic Attack-A-AH

## 2023-05-15 NOTE — Progress Notes (Addendum)
 Patient Care Team: Margaree Mackintosh, MD as PCP - General (Internal Medicine)  I connected with Melinda Payne on 05/15/23 at 4:27 PM by video enabled telemedicine visit and verified that I am speaking with the correct person using two identifiers, myself and Melinda Payne, CMA. I am in my office and patient is in their home.    I discussed the limitations, risks, security and privacy concerns of performing an evaluation and management service by telemedicine and the availability of in-person appointments. I also discussed with the patient that there may be a patient responsible charge related to this service. The patient expressed understanding and agreed to proceed.   Other persons participating in the visit and their role in the encounter: Medical scribe, Larey Brick  Patient's location: Home  Provider's location: Clinic   I provided 20 minutes of time spent during this encounter, including chart review, interviewing patient, medical decision making, and e-scribing medication with > 50% was spent counseling as documented under my assessment & plan.   No chief complaint on file.  Subjective:  Patient Melinda Payne,Female DOB:1940/12/09,83 y.o. MVH:[id number]   83 y.o. Female presents today for acute visit with Bereavement; Grieving. Patient has a past medical history of Anxiety. Recently lost her husband, who was in long-term hospice, and she says that she feels like she is "walking in a fog", but is sleeping at night; thought that he would have been cremated and the entire process is taking much longer than she thought it would and she feels like she should be doing more than she is but she had no energy to clean out his room. Says that she feels guilty regarding her husbands treatment and end of life. Says that she does have support from her church members and family. Past Medical History:  Diagnosis Date   Anxiety    Arthritis    back   Cancer (HCC) 1997   melanoma rt  neck   Carotid artery stenosis    Chronic kidney disease    stage renal disease   Counseling for estrogen replacement therapy    Fibrocystic breast disease    Hypertension    Hypothyroidism    Insomnia    Spinal stenosis    Vitamin D deficiency     Family History  Problem Relation Age of Onset   Alcohol abuse Father    Alzheimer's disease Brother    Dementia Brother    Alzheimer's disease Sister    Dementia Sister    Social History   Social History Narrative   Not on file   Review of Systems  Constitutional:  Positive for malaise/fatigue.  Psychiatric/Behavioral:         (+) Grieving     Objective:  Vitals: There were no vitals taken for this visit.  Physical Exam Vitals and nursing note reviewed.  Constitutional:      General: She is not in acute distress.    Appearance: Normal appearance. She is not toxic-appearing.  HENT:     Head: Normocephalic and atraumatic.  Pulmonary:     Effort: Pulmonary effort is normal.  Skin:    General: Skin is warm and dry.  Neurological:     Mental Status: She is alert and oriented to person, place, and time. Mental status is at baseline.  Psychiatric:        Mood and Affect: Mood normal.        Behavior: Behavior normal.        Thought Content:  Thought content normal.        Judgment: Judgment normal.   Visit initially started as a video visit, was able to see patient for about 5 minutes, but due to technical difficulties visit became a telephone visit. Results:  Studies Obtained And Personally Reviewed By Me: Labs:     Component Value Date/Time   NA 140 02/19/2023 1020   NA 138 11/09/2021 0000   K 4.5 02/19/2023 1020   CL 98 02/19/2023 1020   CO2 33 (H) 02/19/2023 1020   GLUCOSE 97 02/19/2023 1020   BUN 14 02/19/2023 1020   BUN 19 11/09/2021 0000   CREATININE 1.21 (H) 02/19/2023 1020   CALCIUM 10.0 02/19/2023 1020   PROT 7.0 02/19/2023 1020   ALBUMIN 4.6 11/09/2021 0000   AST 16 02/19/2023 1020   ALT 8  02/19/2023 1020   ALKPHOS 58 11/26/2019 1000   BILITOT 0.8 02/19/2023 1020   GFRNONAA 38 (L) 07/06/2020 1229   GFRAA 45 (L) 07/06/2020 1229    Lab Results  Component Value Date   WBC 4.8 02/19/2023   HGB 12.7 02/19/2023   HCT 38.5 02/19/2023   MCV 95.5 02/19/2023   PLT 153 02/19/2023   Lab Results  Component Value Date   CHOL 197 02/19/2023   HDL 89 02/19/2023   LDLCALC 93 02/19/2023   TRIG 64 02/19/2023   CHOLHDL 2.2 02/19/2023   Lab Results  Component Value Date   HGBA1C 5.5 06/29/2014    Lab Results  Component Value Date   TSH 1.67 02/19/2023    Assessment & Plan:   Bereavement; Grieving recent loss of her husband. She says that she feels like she is "walking in a fog" and feels like she should be doing more than she is; feels guilty regarding her husbands treatment and end of life. Does have support from her church members and family. Assured her that that way she is feeling is completely normal, she is grieving appropriately and it will likely take some time for her to recover. No change in anti-anxiety med dose.  Take time to rest.  Do not over do house chores and take care of yourself.  Mild memory loss- treated with Aricept  Hypothyroidism- labs reviewed. Continue same dose of thyroid replacement.  Pure hypercholesterolemia- continue with Zocor 10 mg daily.  Time spent with televisit including chart review was 20 minutes    I,Emily Lagle,acting as a scribe for Margaree Mackintosh, MD.,have documented all relevant documentation on the behalf of Margaree Mackintosh, MD,as directed by  Margaree Mackintosh, MD while in the presence of Margaree Mackintosh, MD.   I, Margaree Mackintosh, MD, have reviewed all documentation for this visit. The documentation on 05/15/23 for the exam, diagnosis, procedures, and orders are all accurate and complete.

## 2023-06-06 DIAGNOSIS — Z85828 Personal history of other malignant neoplasm of skin: Secondary | ICD-10-CM | POA: Diagnosis not present

## 2023-06-06 DIAGNOSIS — L821 Other seborrheic keratosis: Secondary | ICD-10-CM | POA: Diagnosis not present

## 2023-06-06 DIAGNOSIS — D485 Neoplasm of uncertain behavior of skin: Secondary | ICD-10-CM | POA: Diagnosis not present

## 2023-06-06 DIAGNOSIS — L820 Inflamed seborrheic keratosis: Secondary | ICD-10-CM | POA: Diagnosis not present

## 2023-06-29 ENCOUNTER — Telehealth: Payer: Self-pay | Admitting: *Deleted

## 2023-06-29 NOTE — Telephone Encounter (Signed)
 Copied from CRM [id number]. Topic: Clinical - Medication Question >> Jun 28, 2023  2:24 PM Lizabeth Riggs wrote: Reason for CRM:  Ellyn wants to know if ALPRAZolam  (XANAX ) 0.5 MG tablet is making her tired. She wants to know if she can slowly stop taking medication. Please call [facility] at [phone].

## 2023-07-02 ENCOUNTER — Other Ambulatory Visit: Payer: Self-pay

## 2023-07-02 ENCOUNTER — Telehealth: Payer: Self-pay

## 2023-07-02 NOTE — Telephone Encounter (Signed)
 Medication: Alprazolam  0.5 Directions: One tablet by mouth in the morning, one tablet in the early afternoon, and 2 tablets at bedtime  Last given: 03/26/2023 Number refills: 2 Last o/v: 02/21/2023 Follow up: will return in 6 months or as needed  Labs:  02/19/2023, 02/21/2023

## 2023-07-02 NOTE — Telephone Encounter (Signed)
 Called patient and left voicemail to call the office back regarding this.

## 2023-07-02 NOTE — Telephone Encounter (Signed)
 Called patient and left voicemail regarding this.

## 2023-07-03 ENCOUNTER — Ambulatory Visit (INDEPENDENT_AMBULATORY_CARE_PROVIDER_SITE_OTHER): Admitting: Internal Medicine

## 2023-07-03 VITALS — BP 130/84 | HR 65 | Temp 97.5°F | Ht 62.5 in | Wt 138.0 lb

## 2023-07-03 DIAGNOSIS — F4321 Adjustment disorder with depressed mood: Secondary | ICD-10-CM | POA: Diagnosis not present

## 2023-07-03 DIAGNOSIS — E039 Hypothyroidism, unspecified: Secondary | ICD-10-CM

## 2023-07-03 DIAGNOSIS — I10 Essential (primary) hypertension: Secondary | ICD-10-CM

## 2023-07-03 DIAGNOSIS — Z8659 Personal history of other mental and behavioral disorders: Secondary | ICD-10-CM | POA: Diagnosis not present

## 2023-07-03 DIAGNOSIS — R5383 Other fatigue: Secondary | ICD-10-CM | POA: Diagnosis not present

## 2023-07-03 NOTE — Telephone Encounter (Signed)
 Patient had appointment 07/03/2023

## 2023-07-03 NOTE — Patient Instructions (Addendum)
 Cbc with diff, C-met including potassium and TSH drawn today. Add Wellbutrin XL 150 mg daily. Return in June for follow up.

## 2023-07-03 NOTE — Progress Notes (Shared)
 Patient Care Team: Sylvan Evener, MD as PCP - General (Internal Medicine)  Visit Date: 07/03/23  Subjective:   Chief Complaint  Patient presents with   Medication Problem    Wants to discuss being so tired.   Patient ZO:[id number] P Melinda Payne,Female DOB:02-22-1941,82 y.o. JWJ:[id number]   83 y.o. Female presents today for Bereavement; Fatigue. Patient has a past medical history of Anxiety; Insomnia. On 3/11 she was seen via video visit regarding the passing of her husband. Today she says that she has been so tired that some days she doesn't even feel like leaving her bed to get dressed and wasn't sure if her Xanax , which she takes 2 mg daily (0.5 at morning, early afternoon, and 1 mg before bed), may have been contributing to her fatigue.   History of Hypothyroidism treated with Levothyroxine  88 mcg. 02/19/2023 TSH: 1.67.    Past Medical History:  Diagnosis Date   Anxiety    Arthritis    back   Cancer (HCC) 1997   melanoma rt neck   Carotid artery stenosis    Chronic kidney disease    stage renal disease   Counseling for estrogen replacement therapy    Fibrocystic breast disease    Hypertension    Hypothyroidism    Insomnia    Spinal stenosis    Vitamin D  deficiency     Allergies  Allergen Reactions   Penicillins Hives   Codeine Hives    Family History  Problem Relation Age of Onset   Alcohol abuse Father    Alzheimer's disease Brother    Dementia Brother    Alzheimer's disease Sister    Dementia Sister    Social History   Social History Narrative   Not on file   Review of Systems  Constitutional:  Positive for malaise/fatigue.  Psychiatric/Behavioral:         (+) Grieving  All other systems reviewed and are negative.    Objective:  Vitals: BP 130/84   Pulse 65   Temp (!) 97.5 F (36.4 C) (Temporal)   Ht 5' 2.5" (1.588 m)   Wt 138 lb (62.6 kg)   SpO2 98%   BMI 24.84 kg/m   Physical Exam Vitals and nursing note reviewed.  Constitutional:       General: She is not in acute distress.    Appearance: Normal appearance. She is not toxic-appearing.  HENT:     Head: Normocephalic and atraumatic.  Pulmonary:     Effort: Pulmonary effort is normal.  Skin:    General: Skin is warm and dry.  Neurological:     Mental Status: She is alert and oriented to person, place, and time. Mental status is at baseline.  Psychiatric:        Mood and Affect: Mood is depressed. Affect is tearful.        Speech: Speech normal.        Thought Content: Thought content normal.        Cognition and Memory: Cognition normal.        Judgment: Judgment normal.     Comments: Mood and affect attributed to grief     Results:  Studies Obtained And Personally Reviewed By Me: Labs:     Component Value Date/Time   NA 140 02/19/2023 1020   NA 138 11/09/2021 0000   K 4.5 02/19/2023 1020   CL 98 02/19/2023 1020   CO2 33 (H) 02/19/2023 1020   GLUCOSE 97 02/19/2023 1020   BUN 14 02/19/2023  1020   BUN 19 11/09/2021 0000   CREATININE 1.21 (H) 02/19/2023 1020   CALCIUM 10.0 02/19/2023 1020   PROT 7.0 02/19/2023 1020   ALBUMIN 4.6 11/09/2021 0000   AST 16 02/19/2023 1020   ALT 8 02/19/2023 1020   ALKPHOS 58 11/26/2019 1000   BILITOT 0.8 02/19/2023 1020   GFRNONAA 38 (L) 07/06/2020 1229   GFRAA 45 (L) 07/06/2020 1229    Lab Results  Component Value Date   WBC 4.8 02/19/2023   HGB 12.7 02/19/2023   HCT 38.5 02/19/2023   MCV 95.5 02/19/2023   PLT 153 02/19/2023   Lab Results  Component Value Date   CHOL 197 02/19/2023   HDL 89 02/19/2023   LDLCALC 93 02/19/2023   TRIG 64 02/19/2023   CHOLHDL 2.2 02/19/2023   Lab Results  Component Value Date   HGBA1C 5.5 06/29/2014    Lab Results  Component Value Date   TSH 1.67 02/19/2023    Assessment & Plan:   Orders Placed This Encounter  Procedures   CBC with Differential/Platelet   Comprehensive metabolic panel   TSH   Bereavement; Fatigue: on 05/15/23 she was seen via video visit regarding the  passing of her husband. Today, she says that she has been so tired that some days she doesn't even feel like leaving her bed to get dressed and wasn't sure if her Xanax , which she takes 2 mg daily (0.5 at morning, early afternoon, and 1 mg before bed). This was due to anxiety and stress surrounding husband who was in mursing home with dementia, became acutely ill and passed away. Patient resides alone. Family members are supportive as are church members. She has mild memory loss. Adding Wellbutrin XL 150 mg daily to help with fatigue and grief.  Hypothyroidism treated with Levothyroxine  88 mcg. 02/19/2023 TSH: 1.67.  Labs drawn today including TSH, CBC and C-met  Has follow up scheduled here in June.    I,Emily Lagle,acting as a Neurosurgeon for Sylvan Evener, MD.,have documented all relevant documentation on the behalf of Sylvan Evener, MD,as directed by  Sylvan Evener, MD while in the presence of Sylvan Evener, MD.   I, Sylvan Evener, MD, have reviewed all documentation for this visit. The documentation on 07/04/23 for the exam, diagnosis, procedures, and orders are all accurate and complete.

## 2023-07-04 ENCOUNTER — Encounter: Payer: Self-pay | Admitting: Internal Medicine

## 2023-07-04 LAB — CBC WITH DIFFERENTIAL/PLATELET
Absolute Lymphocytes: 3086 {cells}/uL (ref 850–3900)
Absolute Monocytes: 762 {cells}/uL (ref 200–950)
Basophils Absolute: 7 {cells}/uL (ref 0–200)
Basophils Relative: 0.1 %
Eosinophils Absolute: 59 {cells}/uL (ref 15–500)
Eosinophils Relative: 0.8 %
HCT: 37.6 % (ref 35.0–45.0)
Hemoglobin: 12.7 g/dL (ref 11.7–15.5)
MCH: 31.8 pg (ref 27.0–33.0)
MCHC: 33.8 g/dL (ref 32.0–36.0)
MCV: 94.2 fL (ref 80.0–100.0)
MPV: 10.6 fL (ref 7.5–12.5)
Monocytes Relative: 10.3 %
Neutro Abs: 3485 {cells}/uL (ref 1500–7800)
Neutrophils Relative %: 47.1 %
Platelets: 154 10*3/uL (ref 140–400)
RBC: 3.99 10*6/uL (ref 3.80–5.10)
RDW: 11.8 % (ref 11.0–15.0)
Total Lymphocyte: 41.7 %
WBC: 7.4 10*3/uL (ref 3.8–10.8)

## 2023-07-04 LAB — COMPREHENSIVE METABOLIC PANEL WITH GFR
AG Ratio: 2 (calc) (ref 1.0–2.5)
ALT: 14 U/L (ref 6–29)
AST: 19 U/L (ref 10–35)
Albumin: 4.7 g/dL (ref 3.6–5.1)
Alkaline phosphatase (APISO): 68 U/L (ref 37–153)
BUN/Creatinine Ratio: 17 (calc) (ref 6–22)
BUN: 21 mg/dL (ref 7–25)
CO2: 28 mmol/L (ref 20–32)
Calcium: 10.1 mg/dL (ref 8.6–10.4)
Chloride: 98 mmol/L (ref 98–110)
Creat: 1.27 mg/dL — ABNORMAL HIGH (ref 0.60–0.95)
Globulin: 2.4 g/dL (ref 1.9–3.7)
Glucose, Bld: 89 mg/dL (ref 65–99)
Potassium: 4.1 mmol/L (ref 3.5–5.3)
Sodium: 138 mmol/L (ref 135–146)
Total Bilirubin: 0.6 mg/dL (ref 0.2–1.2)
Total Protein: 7.1 g/dL (ref 6.1–8.1)
eGFR: 43 mL/min/{1.73_m2} — ABNORMAL LOW (ref >=60)

## 2023-07-04 LAB — TSH: TSH: 1.83 m[IU]/L (ref 0.40–4.50)

## 2023-07-04 MED ORDER — ALPRAZOLAM 0.5 MG PO TABS: ORAL_TABLET | ORAL | 2 refills | Status: DC

## 2023-07-04 MED ORDER — BUPROPION HCL ER (XL) 150 MG PO TB24: 150.0000 mg | ORAL_TABLET | Freq: Every day | ORAL | 0 refills | Status: DC

## 2023-07-10 ENCOUNTER — Other Ambulatory Visit: Payer: Self-pay

## 2023-07-10 MED ORDER — SIMVASTATIN 10 MG PO TABS: 10.0000 mg | ORAL_TABLET | Freq: Every day | ORAL | 3 refills | Status: AC

## 2023-07-12 ENCOUNTER — Other Ambulatory Visit: Payer: Self-pay

## 2023-07-12 MED ORDER — DONEPEZIL HCL 5 MG PO TABS: 5.0000 mg | ORAL_TABLET | Freq: Every day | ORAL | 2 refills | Status: AC

## 2023-08-02 ENCOUNTER — Other Ambulatory Visit: Payer: Self-pay

## 2023-08-02 MED ORDER — LEVOTHYROXINE SODIUM 88 MCG PO TABS: 88.0000 ug | ORAL_TABLET | Freq: Every day | ORAL | 1 refills | Status: DC

## 2023-08-15 DIAGNOSIS — H04123 Dry eye syndrome of bilateral lacrimal glands: Secondary | ICD-10-CM | POA: Diagnosis not present

## 2023-08-15 DIAGNOSIS — H18593 Other hereditary corneal dystrophies, bilateral: Secondary | ICD-10-CM | POA: Diagnosis not present

## 2023-08-15 DIAGNOSIS — Z961 Presence of intraocular lens: Secondary | ICD-10-CM | POA: Diagnosis not present

## 2023-08-15 DIAGNOSIS — H52203 Unspecified astigmatism, bilateral: Secondary | ICD-10-CM | POA: Diagnosis not present

## 2023-08-20 ENCOUNTER — Other Ambulatory Visit: Payer: Medicare HMO

## 2023-08-20 DIAGNOSIS — E7800 Pure hypercholesterolemia, unspecified: Secondary | ICD-10-CM | POA: Diagnosis not present

## 2023-08-20 DIAGNOSIS — E039 Hypothyroidism, unspecified: Secondary | ICD-10-CM

## 2023-08-20 NOTE — Progress Notes (Shared)
 Patient Care Team: Sylvan Evener, MD as PCP - General (Internal Medicine)  Visit Date: 08/21/23  Subjective:   Chief Complaint  Patient presents with   Medical Management of Chronic Issues   Hyperlipidemia   Hypothyroidism   Hypertension  Patient Melinda Payne,Female DOB:[birth year],83 y.o. WNU:[id number]   83 y.o.Female presents today for 6 months follow-up for Hypertension; Hyperlipidemia; Hypothyroidism; Chronic Kidney Disease, 3a. Patient has a past medical history of Anxiety/Depression and Memory Loss. Was seen for her annual visit on 02/21/2023 and again twice in this office, otherwise has been seen at Dermatology.      History of Hypertension treated with HCTZ 25 mg daily. Blood Pressure: normotensive today at 120/80.   History of Hyperlipidemia treated with  Simvastatin  10 mg daily. 08/20/2023 Lipid Panel: WNL.  History of Hypothyroidism treated with Levothyroxine  88 mcg daily. 08/20/2023 TSH: 1.79.  History of Chronic Kidney Disease, stage IIIa followed by [facility].   History of Anxiety/Depression treated with Xanax  0.5 mg in the morning, afternoon, and 1 mg at bedtime, and Wellbutrin -XL 150 mg daily. Recently Bereaved, on 3/11 was seen regarding her husband's passing, and again on 4/29. She says that she has been cleaning out his rooms and possessions, which has been difficult for her. Says that she has been sleeping about 6-7 hours/night.  History of Mild Memory Loss managed with Aricept  5 mg at bedtime.   Says that recently she saw her eye doctor as she had been dealing with blurry vision to the point it was affecting her reading - was told she had severe dry eyes and recommended eye drops, which she does use but says that they are painful for her.    Past Medical History:  Diagnosis Date   Anxiety    Arthritis    back   Cancer (HCC) 1997   melanoma rt neck   Carotid artery stenosis    Chronic kidney disease    stage renal disease    Counseling for estrogen replacement therapy    Fibrocystic breast disease    Hypertension    Hypothyroidism    Insomnia    Spinal stenosis    Vitamin D  deficiency     Allergies  Allergen Reactions   Penicillins Hives   Codeine Hives    Family History  Problem Relation Age of Onset   Alcohol abuse Father    Alzheimer's disease Brother    Dementia Brother    Alzheimer's disease Sister    Dementia Sister    Social Hx: widow; resides alone, drives  Review of Systems  Constitutional:  Negative for fever and malaise/fatigue.  HENT:  Negative for congestion.   Eyes:  Negative for blurred vision (seen by eye dr).  Respiratory:  Negative for cough and shortness of breath.   Cardiovascular:  Negative for chest pain, palpitations and leg swelling.  Gastrointestinal:  Negative for vomiting.  Musculoskeletal:  Negative for back pain.  Skin:  Negative for rash.  Neurological:  Negative for loss of consciousness and headaches.     Objective:  Vitals: BP 120/80   Pulse 64   Ht 5' 2.5 (1.588 m)   Wt 135 lb (61.2 kg)   SpO2 98%   BMI 24.30 kg/m   Physical Exam Vitals and nursing note reviewed.  Constitutional:      General: She is not in acute distress.    Appearance: Normal appearance. She is not toxic-appearing.  HENT:     Head: Normocephalic and atraumatic.  Cardiovascular:     Rate and Rhythm: Normal rate and regular rhythm. No extrasystoles are present.    Pulses: Normal pulses.     Heart sounds: Normal heart sounds. No murmur heard.    No friction rub. No gallop.  Pulmonary:     Effort: Pulmonary effort is normal. No respiratory distress.     Breath sounds: Normal breath sounds. No wheezing or rales.   Musculoskeletal:     Right lower leg: No edema.     Left lower leg: No edema.   Skin:    General: Skin is warm and dry.   Neurological:     Mental Status: She is alert and oriented to person, place, and time. Mental status is at baseline.   Psychiatric:         Mood and Affect: Mood normal.        Behavior: Behavior normal.        Thought Content: Thought content normal.        Judgment: Judgment normal.     Results:  Studies Obtained And Personally Reviewed By Me: Labs:     Component Value Date/Time   NA 138 07/03/2023 1255   NA 138 11/09/2021 0000   K 4.1 07/03/2023 1255   CL 98 07/03/2023 1255   CO2 28 07/03/2023 1255   GLUCOSE 89 07/03/2023 1255   BUN 21 07/03/2023 1255   BUN 19 11/09/2021 0000   CREATININE 1.27 (H) 07/03/2023 1255   CALCIUM 10.1 07/03/2023 1255   PROT 7.1 07/03/2023 1255   ALBUMIN 4.6 11/09/2021 0000   AST 19 07/03/2023 1255   ALT 14 07/03/2023 1255   ALKPHOS 58 11/26/2019 1000   BILITOT 0.6 07/03/2023 1255   GFRNONAA 38 (L) 07/06/2020 1229   GFRAA 45 (L) 07/06/2020 1229    Lab Results  Component Value Date   WBC 7.4 07/03/2023   HGB 12.7 07/03/2023   HCT 37.6 07/03/2023   MCV 94.2 07/03/2023   PLT 154 07/03/2023   Lab Results  Component Value Date   CHOL 175 08/20/2023   HDL 87 08/20/2023   LDLCALC 74 08/20/2023   TRIG 56 08/20/2023   CHOLHDL 2.0 08/20/2023   Lab Results  Component Value Date   HGBA1C 5.5 06/29/2014    Lab Results  Component Value Date   TSH 1.79 08/20/2023    Assessment & Plan:   Hypertension treated with HCTZ 25 mg daily. Blood Pressure: normotensive today at 120/80.   Hyperlipidemia treated with  Simvastatin  10 mg daily. 08/20/2023 Lipid Panel: WNL.  Hypothyroidism treated with Levothyroxine  88 mcg daily. 08/20/2023 TSH: 1.79.  Chronic Kidney Disease, stage IIIa followed by [facility].   Anxiety/Depression treated with Xanax  0.5 mg in the morning, afternoon, and 1 mg at bedtime, and Wellbutrin -XL 150 mg daily.   Bereavement: on 3/11 was seen regarding her husband's passing, and again on 4/29. She says that she has been cleaning out his rooms and possessions, which has been difficult for her, but she is adjusting from his loss. Says that she has been  sleeping about 6-7 hours/night.  Mild Memory Loss managed with Aricept  5 mg at bedtime.   Dry Eyes, Bilaterally: recently she saw her eye doctor as she had been dealing with blurry vision to the point it was affecting her reading - was told she had severe dry eyes and recommended eye drops, which she does use but says that they are painful for her.      I,Emily Lagle,acting as  a scribe for Sylvan Evener, MD.,have documented all relevant documentation on the behalf of Sylvan Evener, MD,as directed by  Sylvan Evener, MD while in the presence of Sylvan Evener, MD.   I, Sylvan Evener, MD, have reviewed all documentation for this visit. The documentation on 08/24/23 for the exam, diagnosis, procedures, and orders are all accurate and complete.

## 2023-08-21 ENCOUNTER — Ambulatory Visit: Payer: Medicare HMO | Admitting: Internal Medicine

## 2023-08-21 ENCOUNTER — Encounter: Payer: Self-pay | Admitting: Internal Medicine

## 2023-08-21 ENCOUNTER — Ambulatory Visit: Payer: Self-pay | Admitting: Internal Medicine

## 2023-08-21 VITALS — BP 120/80 | HR 64 | Ht 62.5 in | Wt 135.0 lb

## 2023-08-21 DIAGNOSIS — H903 Sensorineural hearing loss, bilateral: Secondary | ICD-10-CM

## 2023-08-21 DIAGNOSIS — N1831 Chronic kidney disease, stage 3a: Secondary | ICD-10-CM

## 2023-08-21 DIAGNOSIS — I10 Essential (primary) hypertension: Secondary | ICD-10-CM

## 2023-08-21 DIAGNOSIS — M8580 Other specified disorders of bone density and structure, unspecified site: Secondary | ICD-10-CM

## 2023-08-21 DIAGNOSIS — E039 Hypothyroidism, unspecified: Secondary | ICD-10-CM | POA: Diagnosis not present

## 2023-08-21 DIAGNOSIS — F5105 Insomnia due to other mental disorder: Secondary | ICD-10-CM

## 2023-08-21 DIAGNOSIS — Z9889 Other specified postprocedural states: Secondary | ICD-10-CM | POA: Diagnosis not present

## 2023-08-21 DIAGNOSIS — R413 Other amnesia: Secondary | ICD-10-CM | POA: Diagnosis not present

## 2023-08-21 DIAGNOSIS — E7800 Pure hypercholesterolemia, unspecified: Secondary | ICD-10-CM | POA: Diagnosis not present

## 2023-08-21 DIAGNOSIS — F419 Anxiety disorder, unspecified: Secondary | ICD-10-CM

## 2023-08-21 DIAGNOSIS — Z634 Disappearance and death of family member: Secondary | ICD-10-CM

## 2023-08-21 LAB — TSH: TSH: 1.79 m[IU]/L (ref 0.40–4.50)

## 2023-08-21 LAB — LIPID PANEL
Cholesterol: 175 mg/dL (ref <200)
HDL: 87 mg/dL (ref >=50)
LDL Cholesterol (Calc): 74 mg/dL
Non-HDL Cholesterol (Calc): 88 mg/dL (ref <130)
Total CHOL/HDL Ratio: 2 (calc) (ref <5.0)
Triglycerides: 56 mg/dL (ref <150)

## 2023-08-24 NOTE — Patient Instructions (Addendum)
 Blood pressure and labs are stable. She has CPE and Medicare wellness visit scheduled for December. Continue current meds.

## 2023-09-02 ENCOUNTER — Encounter: Payer: Self-pay | Admitting: Internal Medicine

## 2023-09-18 ENCOUNTER — Other Ambulatory Visit: Payer: Self-pay

## 2023-09-18 MED ORDER — BUPROPION HCL ER (XL) 150 MG PO TB24: 150.0000 mg | ORAL_TABLET | Freq: Every day | ORAL | 0 refills | Status: DC

## 2023-09-18 MED ORDER — HYDROCHLOROTHIAZIDE 25 MG PO TABS: 25.0000 mg | ORAL_TABLET | Freq: Every day | ORAL | 3 refills | Status: AC

## 2023-09-25 ENCOUNTER — Other Ambulatory Visit: Payer: Self-pay

## 2023-09-25 MED ORDER — ALPRAZOLAM 0.5 MG PO TABS: ORAL_TABLET | ORAL | 2 refills | Status: DC

## 2023-10-29 DIAGNOSIS — D692 Other nonthrombocytopenic purpura: Secondary | ICD-10-CM | POA: Diagnosis not present

## 2023-10-29 DIAGNOSIS — D2262 Melanocytic nevi of left upper limb, including shoulder: Secondary | ICD-10-CM | POA: Diagnosis not present

## 2023-10-29 DIAGNOSIS — Z85820 Personal history of malignant melanoma of skin: Secondary | ICD-10-CM | POA: Diagnosis not present

## 2023-10-29 DIAGNOSIS — Z85828 Personal history of other malignant neoplasm of skin: Secondary | ICD-10-CM | POA: Diagnosis not present

## 2023-10-29 DIAGNOSIS — L570 Actinic keratosis: Secondary | ICD-10-CM | POA: Diagnosis not present

## 2023-10-29 DIAGNOSIS — L905 Scar conditions and fibrosis of skin: Secondary | ICD-10-CM | POA: Diagnosis not present

## 2023-10-29 DIAGNOSIS — L0889 Other specified local infections of the skin and subcutaneous tissue: Secondary | ICD-10-CM | POA: Diagnosis not present

## 2023-10-29 DIAGNOSIS — D225 Melanocytic nevi of trunk: Secondary | ICD-10-CM | POA: Diagnosis not present

## 2023-10-29 DIAGNOSIS — L821 Other seborrheic keratosis: Secondary | ICD-10-CM | POA: Diagnosis not present

## 2023-11-14 DIAGNOSIS — D0472 Carcinoma in situ of skin of left lower limb, including hip: Secondary | ICD-10-CM | POA: Diagnosis not present

## 2023-11-14 DIAGNOSIS — Z85828 Personal history of other malignant neoplasm of skin: Secondary | ICD-10-CM | POA: Diagnosis not present

## 2023-11-14 DIAGNOSIS — Z85820 Personal history of malignant melanoma of skin: Secondary | ICD-10-CM | POA: Diagnosis not present

## 2023-11-14 DIAGNOSIS — D485 Neoplasm of uncertain behavior of skin: Secondary | ICD-10-CM | POA: Diagnosis not present

## 2023-11-14 DIAGNOSIS — D0471 Carcinoma in situ of skin of right lower limb, including hip: Secondary | ICD-10-CM | POA: Diagnosis not present

## 2023-11-20 DIAGNOSIS — N2581 Secondary hyperparathyroidism of renal origin: Secondary | ICD-10-CM | POA: Diagnosis not present

## 2023-11-20 DIAGNOSIS — D631 Anemia in chronic kidney disease: Secondary | ICD-10-CM | POA: Diagnosis not present

## 2023-11-20 DIAGNOSIS — I129 Hypertensive chronic kidney disease with stage 1 through stage 4 chronic kidney disease, or unspecified chronic kidney disease: Secondary | ICD-10-CM | POA: Diagnosis not present

## 2023-11-20 DIAGNOSIS — N189 Chronic kidney disease, unspecified: Secondary | ICD-10-CM | POA: Diagnosis not present

## 2023-11-20 DIAGNOSIS — N183 Chronic kidney disease, stage 3 unspecified: Secondary | ICD-10-CM | POA: Diagnosis not present

## 2023-11-20 LAB — LAB REPORT - SCANNED: EGFR: 41

## 2023-12-21 ENCOUNTER — Other Ambulatory Visit: Payer: Self-pay | Admitting: Internal Medicine

## 2023-12-21 MED ORDER — BUPROPION HCL ER (XL) 150 MG PO TB24: 150.0000 mg | ORAL_TABLET | Freq: Every day | ORAL | 1 refills | Status: AC

## 2023-12-21 NOTE — Telephone Encounter (Signed)
 Per telephone encounter, she is going out of town on 12/26/23 and will need mediation to take with her.

## 2023-12-21 NOTE — Telephone Encounter (Signed)
 Copied from CRM [id number]. Topic: Clinical - Medication Refill >> Dec 21, 2023  9:20 AM Montie POUR wrote: Medication:  buPROPion  (WELLBUTRIN  XL) 150 MG 24 hr tablet  Has the patient contacted their pharmacy? Yes (Agent: If no, request that the patient contact the pharmacy for the refill. If patient does not wish to contact the pharmacy document the reason why and proceed with request.) (Agent: If yes, when and what did the pharmacy advise?) Pharmacy needs order to refill  This is the patient's preferred pharmacy:  [facility] [id number], [place] - [address] [place] [address] Phone: [phone] Fax: [phone]  Is this the correct pharmacy for this prescription? Yes If no, delete pharmacy and type the correct one.   Has the prescription been filled recently? No  Is the patient out of the medication? No  Has the patient been seen for an appointment in the last year OR does the patient have an upcoming appointment? Yes  Can we respond through MyChart? Yes  Agent: Please be advised that Rx refills may take up to 3 business days. We ask that you follow-up with your pharmacy.

## 2023-12-24 ENCOUNTER — Other Ambulatory Visit: Payer: Self-pay

## 2023-12-24 MED ORDER — ALPRAZOLAM 0.5 MG PO TABS: ORAL_TABLET | ORAL | 2 refills | Status: DC

## 2024-01-16 DIAGNOSIS — L570 Actinic keratosis: Secondary | ICD-10-CM | POA: Diagnosis not present

## 2024-01-16 DIAGNOSIS — Z85828 Personal history of other malignant neoplasm of skin: Secondary | ICD-10-CM | POA: Diagnosis not present

## 2024-01-16 DIAGNOSIS — L905 Scar conditions and fibrosis of skin: Secondary | ICD-10-CM | POA: Diagnosis not present

## 2024-02-04 ENCOUNTER — Telehealth: Payer: Self-pay | Admitting: Internal Medicine

## 2024-02-04 ENCOUNTER — Other Ambulatory Visit: Payer: Self-pay

## 2024-02-04 MED ORDER — LEVOTHYROXINE SODIUM 88 MCG PO TABS: 88.0000 ug | ORAL_TABLET | Freq: Every day | ORAL | 1 refills | Status: DC

## 2024-02-04 NOTE — Telephone Encounter (Unsigned)
 Copied from CRM [id number]. Topic: Clinical - Medication Refill >> Feb 04, 2024 11:06 AM Delon DASEN wrote: Medication: levothyroxine  (SYNTHROID ) 88 MCG tablet  Has the patient contacted their pharmacy? Yes (Agent: If no, request that the patient contact the pharmacy for the refill. If patient does not wish to contact the pharmacy document the reason why and proceed with request.) (Agent: If yes, when and what did the pharmacy advise?)  This is the patient's preferred pharmacy:  [facility] [id number], [place] - [address] [place] [address] Phone: [phone] Fax: [phone]    Is this the correct pharmacy for this prescription? Yes If no, delete pharmacy and type the correct one.   Has the prescription been filled recently? Yes  Is the patient out of the medication? No  Has the patient been seen for an appointment in the last year OR does the patient have an upcoming appointment? Yes  Can we respond through MyChart? Yes  Agent: Please be advised that Rx refills may take up to 3 business days. We ask that you follow-up with your pharmacy.

## 2024-02-05 MED ORDER — LEVOTHYROXINE SODIUM 88 MCG PO TABS: 88.0000 ug | ORAL_TABLET | Freq: Every day | ORAL | 1 refills | Status: AC
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
## 2024-03-03 ENCOUNTER — Other Ambulatory Visit: Payer: Self-pay

## 2024-03-03 DIAGNOSIS — Z0000 Encounter for general adult medical examination without abnormal findings: Secondary | ICD-10-CM

## 2024-03-03 DIAGNOSIS — I10 Essential (primary) hypertension: Secondary | ICD-10-CM

## 2024-03-03 DIAGNOSIS — E039 Hypothyroidism, unspecified: Secondary | ICD-10-CM

## 2024-03-03 DIAGNOSIS — N1831 Chronic kidney disease, stage 3a: Secondary | ICD-10-CM

## 2024-03-03 DIAGNOSIS — E7800 Pure hypercholesterolemia, unspecified: Secondary | ICD-10-CM

## 2024-03-03 DIAGNOSIS — M8580 Other specified disorders of bone density and structure, unspecified site: Secondary | ICD-10-CM

## 2024-03-04 ENCOUNTER — Ambulatory Visit: Payer: Self-pay | Admitting: Internal Medicine

## 2024-03-04 ENCOUNTER — Encounter: Payer: Self-pay | Admitting: Internal Medicine

## 2024-03-04 VITALS — BP 140/70 | HR 66 | Ht 60.25 in | Wt 134.0 lb

## 2024-03-04 DIAGNOSIS — M8580 Other specified disorders of bone density and structure, unspecified site: Secondary | ICD-10-CM

## 2024-03-04 DIAGNOSIS — Z8542 Personal history of malignant neoplasm of other parts of uterus: Secondary | ICD-10-CM

## 2024-03-04 DIAGNOSIS — R413 Other amnesia: Secondary | ICD-10-CM | POA: Diagnosis not present

## 2024-03-04 DIAGNOSIS — Z9889 Other specified postprocedural states: Secondary | ICD-10-CM

## 2024-03-04 DIAGNOSIS — N183 Chronic kidney disease, stage 3 unspecified: Secondary | ICD-10-CM | POA: Diagnosis not present

## 2024-03-04 DIAGNOSIS — I10 Essential (primary) hypertension: Secondary | ICD-10-CM

## 2024-03-04 DIAGNOSIS — Z0000 Encounter for general adult medical examination without abnormal findings: Secondary | ICD-10-CM | POA: Diagnosis not present

## 2024-03-04 DIAGNOSIS — Z1231 Encounter for screening mammogram for malignant neoplasm of breast: Secondary | ICD-10-CM

## 2024-03-04 DIAGNOSIS — F419 Anxiety disorder, unspecified: Secondary | ICD-10-CM | POA: Diagnosis not present

## 2024-03-04 DIAGNOSIS — H903 Sensorineural hearing loss, bilateral: Secondary | ICD-10-CM

## 2024-03-04 DIAGNOSIS — E785 Hyperlipidemia, unspecified: Secondary | ICD-10-CM | POA: Diagnosis not present

## 2024-03-04 DIAGNOSIS — E039 Hypothyroidism, unspecified: Secondary | ICD-10-CM

## 2024-03-04 DIAGNOSIS — E7800 Pure hypercholesterolemia, unspecified: Secondary | ICD-10-CM

## 2024-03-04 DIAGNOSIS — Z820 Family history of epilepsy and other diseases of the nervous system: Secondary | ICD-10-CM

## 2024-03-04 DIAGNOSIS — N1831 Chronic kidney disease, stage 3a: Secondary | ICD-10-CM

## 2024-03-04 DIAGNOSIS — Z85820 Personal history of malignant melanoma of skin: Secondary | ICD-10-CM

## 2024-03-04 LAB — POCT URINALYSIS DIP (CLINITEK)
Bilirubin, UA: NEGATIVE
Blood, UA: NEGATIVE
Glucose, UA: NEGATIVE mg/dL
Ketones, POC UA: NEGATIVE mg/dL
Leukocytes, UA: NEGATIVE
Nitrite, UA: NEGATIVE
POC PROTEIN,UA: NEGATIVE
Spec Grav, UA: 1.01
Urobilinogen, UA: 0.2 U/dL
pH, UA: 6.5

## 2024-03-04 LAB — LIPID PANEL
Cholesterol: 210 mg/dL — ABNORMAL HIGH
HDL: 100 mg/dL
LDL Cholesterol (Calc): 95 mg/dL
Non-HDL Cholesterol (Calc): 110 mg/dL
Total CHOL/HDL Ratio: 2.1 (calc)
Triglycerides: 65 mg/dL

## 2024-03-04 LAB — TSH: TSH: 2.79 m[IU]/L (ref 0.40–4.50)

## 2024-03-04 MED ORDER — ALPRAZOLAM 0.5 MG PO TABS: ORAL_TABLET | ORAL | 1 refills | Status: DC

## 2024-03-04 NOTE — Progress Notes (Unsigned)
 "  Chief Complaint  Patient presents with   Annual Exam   Medicare Wellness     Subjective:   Melinda Payne is a 83 y.o. female who presents for a Medicare Annual Wellness Visit.  Visit info / Clinical Intake: Medicare Wellness Visit Type:: Subsequent Annual Wellness Visit Persons participating in visit and providing information:: patient Medicare Wellness Visit Mode:: In-person (required for WTM) Interpreter Needed?: No Pre-visit prep was completed: yes AWV questionnaire completed by patient prior to visit?: yes Date:: 03/03/24 Living arrangements:: (!) lives alone Patient's Overall Health Status Rating: good Typical amount of pain: some Does pain affect daily life?: no Are you currently prescribed opioids?: no  Dietary Habits and Nutritional Risks How many meals a day?: 2 Eats fruit and vegetables daily?: (!) no Most meals are obtained by: eating out Diabetic:: no  Functional Status Activities of Daily Living (to include ambulation/medication): Independent Ambulation: Independent Medication Administration: Independent Home Management (perform basic housework or laundry): Independent Manage your own finances?: yes Primary transportation is: driving Concerns about vision?: no *vision screening is required for WTM* Concerns about hearing?: no  Fall Screening Falls in the past year?: 0 Number of falls in past year: 0 Was there an injury with Fall?: 0 Fall Risk Category Calculator: 0 Patient Fall Risk Level: Low Fall Risk  Fall Risk Patient at Risk for Falls Due to: No Fall Risks Fall risk Follow up: Falls prevention discussed; Education provided; Falls evaluation completed  Home and Transportation Safety: All rugs have non-skid backing?: yes All stairs or steps have railings?: N/A, no stairs Grab bars in the bathtub or shower?: yes Have non-skid surface in bathtub or shower?: yes Good home lighting?: yes Regular seat belt use?: yes Hospital stays in the  last year:: no  Cognitive Assessment Difficulty concentrating, remembering, or making decisions? : yes Will 6CIT or Mini Cog be Completed: no 6CIT or Mini Cog Declined: patient alert, oriented, able to answer questions appropriately and recall recent events  Advance Directives (For Healthcare) Does Patient Have a Medical Advance Directive?: Yes Does patient want to make changes to medical advance directive?: No - Patient declined Type of Advance Directive: Living will; Healthcare Power of Attorney Copy of Healthcare Power of Attorney in Chart?: Yes - validated most recent copy scanned in chart (See row information) Copy of Living Will in Chart?: Yes - validated most recent copy scanned in chart (See row information)  Reviewed/Updated  Reviewed/Updated: Reviewed All (Medical, Surgical, Family, Medications, Allergies, Care Teams, Patient Goals)    Allergies (verified) Penicillins and Codeine   Current Medications (verified) Outpatient Encounter Medications as of 03/04/2024  Medication Sig   ALPRAZolam  (XANAX ) 0.5 MG tablet One tablet by mouth in the morning, one tablet in the early afternoon, and 2 tablets at bedtime   Ascorbic Acid (VITAMIN C) 1000 MG tablet Take 1,000 mg by mouth daily. 2x daily.   aspirin  EC 81 MG tablet Take 81 mg by mouth daily. Swallow whole.   buPROPion  (WELLBUTRIN  XL) 150 MG 24 hr tablet Take 1 tablet (150 mg total) by mouth daily.   Cholecalciferol (VITAMIN D -3) 1000 units CAPS Take 1,000 Units by mouth daily.    donepezil  (ARICEPT ) 5 MG tablet Take 1 tablet (5 mg total) by mouth at bedtime.   hydrochlorothiazide  (HYDRODIURIL ) 25 MG tablet Take 1 tablet (25 mg total) by mouth daily.   levothyroxine  (SYNTHROID ) 88 MCG tablet Take 1 tablet (88 mcg total) by mouth daily.   simvastatin  (ZOCOR ) 10 MG tablet Take  1 tablet (10 mg total) by mouth daily.   [DISCONTINUED] ALPRAZolam  (XANAX ) 0.5 MG tablet One tablet by mouth in the morning, one tablet in the early  afternoon, and 2 tablets at bedtime   No facility-administered encounter medications on file as of 03/04/2024.    History: Past Medical History:  Diagnosis Date   Anxiety    Arthritis    back   Cancer (HCC) 1997   melanoma rt neck   Carotid artery stenosis    Chronic kidney disease    stage renal disease   Counseling for estrogen replacement therapy    Fibrocystic breast disease    Hypertension    Hypothyroidism    Insomnia    Spinal stenosis    Vitamin D  deficiency    Past Surgical History:  Procedure Laterality Date   ABDOMINAL HYSTERECTOMY  03/1997   bso endometrial cancer   ENDARTERECTOMY Right 11/28/2019   Procedure: RIGHT CAROTID ENDARTERECTOMY;  Surgeon: Oris Krystal FALCON, MD;  Location: [facility] OR;  Service: Vascular;  Laterality: Right;   EYE SURGERY     MELANOMA EXCISION     neck   PATCH ANGIOPLASTY Right 11/28/2019   Procedure: PATCH ANGIOPLASTY RIGHT CAROTID;  Surgeon: Oris Krystal FALCON, MD;  Location: [facility] OR;  Service: Vascular;  Laterality: Right;   TUBAL LIGATION     Family History  Problem Relation Age of Onset   Stroke Mother    Miscarriages / Stillbirths Mother    Alcohol abuse Father    Alzheimer's disease Brother    Dementia Brother    Hearing loss Brother    Alzheimer's disease Sister    Dementia Sister    Kidney disease Sister    COPD Sister    Cancer Sister    Hypertension Sister    Hearing loss Sister    Social History   Occupational History   Not on file  Tobacco Use   Smoking status: Never   Smokeless tobacco: Never  Substance and Sexual Activity   Alcohol use: Never   Drug use: Never   Sexual activity: Not Currently   Tobacco Counseling Counseling given: No  SDOH Screenings   Food Insecurity: No Food Insecurity (03/04/2024)  Housing: Low Risk (03/04/2024)  Transportation Needs: No Transportation Needs (03/04/2024)  Utilities: Not At Risk (03/04/2024)  Alcohol Screen: Low Risk (03/04/2024)  Depression (PHQ2-9): Medium Risk  (03/04/2024)  Financial Resource Strain: Low Risk (03/04/2024)  Physical Activity: Sufficiently Active (03/04/2024)  Social Connections: Moderately Integrated (03/04/2024)  Stress: Stress Concern Present (03/04/2024)  Tobacco Use: Low Risk (03/04/2024)  Health Literacy: Adequate Health Literacy (03/04/2024)   See flowsheets for full screening details  Depression Screen PHQ 2 & 9 Depression Scale- Over the past 2 weeks, how often have you been bothered by any of the following problems? Little interest or pleasure in doing things: 2 Feeling down, depressed, or hopeless (PHQ Adolescent also includes...irritable): 2 PHQ-2 Total Score: 4 Trouble falling or staying asleep, or sleeping too much: 2 Feeling tired or having little energy: 2 Poor appetite or overeating (PHQ Adolescent also includes...weight loss): 2 Feeling bad about yourself - or that you are a failure or have let yourself or your family down: 0 Trouble concentrating on things, such as reading the newspaper or watching television (PHQ Adolescent also includes...like school work): 0 Moving or speaking so slowly that other people could have noticed. Or the opposite - being so fidgety or restless that you have been moving around a lot more than usual: 0 Thoughts  that you would be better off dead, or of hurting yourself in some way: 0 PHQ-9 Total Score: 10 If you checked off any problems, how difficult have these problems made it for you to do your work, take care of things at home, or get along with other people?: Not difficult at all     Goals Addressed   None          Objective:    Today's Vitals   03/04/24 1451 03/04/24 1504  BP: (!) 140/80 (!) 140/70  Pulse: 66   SpO2: 99%   Weight: 134 lb (60.8 kg)   Height: 5' 0.25 (1.53 m)   PainSc: 0-No pain    Body mass index is 25.95 kg/m.  Hearing/Vision screen Vision Screening - Comments:: [facility] ophthalmology  Immunizations and Health Maintenance Health  Maintenance  Topic Date Due   COVID-19 Vaccine (10 - 2025-26 season) 11/05/2023   DTaP/Tdap/Td (5 - Td or Tdap) 12/02/2023   Medicare Annual Wellness (AWV)  02/21/2024   Mammogram  03/20/2024   Pneumococcal Vaccine: 50+ Years  Completed   Influenza Vaccine  Completed   Bone Density Scan  Completed   Zoster Vaccines- Shingrix  Completed   Meningococcal B Vaccine  Aged Out   Hepatitis B Vaccines 19-59 Average Risk  Discontinued   Colonoscopy  Discontinued        Assessment/Plan:  This is a routine wellness examination for Melinda Payne.  Patient Care Team: Perri Ronal PARAS, MD as PCP - General (Internal Medicine)  I have personally reviewed and noted the following in the patients chart:   Medical and social history Use of alcohol, tobacco or illicit drugs  Current medications and supplements including opioid prescriptions. Functional ability and status Nutritional status Physical activity Advanced directives List of other physicians Hospitalizations, surgeries, and ER visits in previous 12 months Vitals Screenings to include cognitive, depression, and falls Referrals and appointments  Orders Placed This Encounter  Procedures   MM 3D SCREENING MAMMOGRAM BILATERAL BREAST    Standing Status:   Future    Expiration Date:   03/04/2025    Reason for Exam (SYMPTOM  OR DIAGNOSIS REQUIRED):   screening for breast cancer    Preferred imaging location?:   [facility]   POCT URINALYSIS DIP (CLINITEK)   In addition, I have reviewed and discussed with patient certain preventive protocols, quality metrics, and best practice recommendations. A written personalized care plan for preventive services as well as general preventive health recommendations were provided to patient.   Melinda Payne, CMA   03/04/2024   No follow-ups on file.  After Visit Summary: (In Person-Printed) AVS printed and given to the patient  Nurse Notes: none "

## 2024-03-07 ENCOUNTER — Telehealth: Payer: Self-pay

## 2024-03-07 NOTE — Telephone Encounter (Signed)
 Copied from CRM [id number]. Topic: Clinical - Medication Question >> Mar 07, 2024 11:15 AM Olam RAMAN wrote: Reason for CRM: Pt  had a question on zanax, needed to take sure how and when to take it. Doesn't know if take it at night and stop on the afternoon Cb  [phone]

## 2024-03-10 ENCOUNTER — Telehealth: Payer: Self-pay | Admitting: Internal Medicine

## 2024-03-10 NOTE — Telephone Encounter (Signed)
 Copied from CRM [id number]. Topic: Clinical - Medication Question >> Mar 10, 2024 11:35 AM Delon DASEN wrote: Patient calling back- [phone] >> Mar 10, 2024 11:26 AM Montie POUR wrote: Melinda Payne is returning a call. She could not get her voicemail because her phone is acting up. Please call her back at [phone] to discuss the above question. I tried to transfer to clinic and no answer.

## 2024-03-20 ENCOUNTER — Other Ambulatory Visit: Payer: Self-pay

## 2024-03-20 DIAGNOSIS — I6529 Occlusion and stenosis of unspecified carotid artery: Secondary | ICD-10-CM

## 2024-03-27 ENCOUNTER — Ambulatory Visit: Admitting: Internal Medicine

## 2024-03-27 ENCOUNTER — Ambulatory Visit: Payer: Self-pay

## 2024-03-27 ENCOUNTER — Encounter: Payer: Self-pay | Admitting: Internal Medicine

## 2024-03-27 VITALS — BP 130/70 | HR 65 | Ht 62.5 in | Wt 135.0 lb

## 2024-03-27 DIAGNOSIS — F411 Generalized anxiety disorder: Secondary | ICD-10-CM

## 2024-03-27 DIAGNOSIS — Z634 Disappearance and death of family member: Secondary | ICD-10-CM

## 2024-03-27 DIAGNOSIS — I10 Essential (primary) hypertension: Secondary | ICD-10-CM | POA: Diagnosis not present

## 2024-03-27 DIAGNOSIS — F419 Anxiety disorder, unspecified: Secondary | ICD-10-CM | POA: Diagnosis not present

## 2024-03-27 DIAGNOSIS — F1320 Sedative, hypnotic or anxiolytic dependence, uncomplicated: Secondary | ICD-10-CM

## 2024-03-27 DIAGNOSIS — E039 Hypothyroidism, unspecified: Secondary | ICD-10-CM

## 2024-03-27 DIAGNOSIS — F32A Depression, unspecified: Secondary | ICD-10-CM

## 2024-03-27 DIAGNOSIS — F13930 Sedative, hypnotic or anxiolytic use, unspecified with withdrawal, uncomplicated: Secondary | ICD-10-CM

## 2024-03-27 DIAGNOSIS — R413 Other amnesia: Secondary | ICD-10-CM

## 2024-03-27 DIAGNOSIS — G4709 Other insomnia: Secondary | ICD-10-CM

## 2024-03-27 MED ORDER — ALPRAZOLAM 0.5 MG PO TABS: ORAL_TABLET | ORAL | 1 refills | Status: AC

## 2024-03-27 NOTE — Patient Instructions (Addendum)
 Patient is not tolerating gradual reduction in Xanax  dose. It was her desire to cut down on this medication. Has not been sleeping and is anxious.  Will take Xanax  0.5 mg in the morning and in the early afternoon and 2 tabs at bedtime

## 2024-03-27 NOTE — Telephone Encounter (Signed)
 FYI Only or Action Required?: FYI only for provider: appointment scheduled on 1 hour.  Patient was last seen in primary care on 03/04/2024 by Perri Ronal PARAS, MD.  Called Nurse Triage reporting Hypertension. Pt is having difficulty with Xanax  taper. She is not sleeping. And has elevated BP  Symptoms began several days ago.  Interventions attempted: Nothing.  Symptoms are: gradually worsening.  Triage Disposition: See PCP When Office is Open (Within 3 Days)  Patient/caregiver understands and will follow disposition?: Yes                      Reason for Triage: PCP is trying to wean the patient off on Xanax  and she hasn't been sleeping well and her anxiety has increased.    Reason for Disposition  Systolic BP >= 160 OR Diastolic >= 100  Answer Assessment - Initial Assessment Questions 1. BLOOD PRESSURE: What is your blood pressure? Did you take at least two measurements 5 minutes apart?     158/60 - this morning 2. ONSET: When did you take your blood pressure?     Home machine 3. HOW: How did you take your blood pressure? (e.g., automatic home BP monitor, visiting nurse)     automatic 4. HISTORY: Do you have a history of high blood pressure?     yes 5. MEDICINES: Are you taking any medicines for blood pressure? Have you missed any doses recently?     Hydrochlorothiazide   6. OTHER SYMPTOMS: Do you have any symptoms? (e.g., blurred vision, chest pain, difficulty breathing, headache, weakness)     Indigestion - burping  Protocols used: Blood Pressure - High-A-AH

## 2024-03-27 NOTE — Progress Notes (Signed)
 "   Patient Care Team: Perri Ronal PARAS, MD as PCP - General (Internal Medicine)  Visit Date: 03/27/24  Subjective:    Patient ID: Melinda Payne , Female   DOB: Feb 08, 1941, 84 y.o.    MRN: [id number]   84 y.o. Female presents today for Anxiety. Patient has a past medical history of  Hypertension, Hypothyroidism, Chronic kidney disease.  History of Anxiety treated with Alprazolam  0.5 mg in the morning, early after noon and 1 mg at bedtime. She has been trying to taper off of the Xanax  but she hasn't had a restful night  of sleep since. She  currently has been taking alprazolam  three times daily.   Has cut down on bedtime dose. Instead of 2  tabs t bedtime is now taking one tab at bedtime and found herself having issues with anxiety and panic.  History of hypertension treated with hydrochlorothiazide  25 mg daily. Blood pressure is normal at 130/70.   Past Medical History:  Diagnosis Date   Anxiety    Arthritis    back   Cancer (HCC) 1997   melanoma rt neck   Carotid artery stenosis    Chronic kidney disease    stage renal disease   Counseling for estrogen replacement therapy    Fibrocystic breast disease    Hypertension    Hypothyroidism    Insomnia    Spinal stenosis    Vitamin D  deficiency      Family History  Problem Relation Age of Onset   Stroke Mother    Miscarriages / Stillbirths Mother    Alcohol abuse Father    Alzheimer's disease Brother    Dementia Brother    Hearing loss Brother    Alzheimer's disease Sister    Dementia Sister    Kidney disease Sister    COPD Sister    Cancer Sister    Hypertension Sister    Hearing loss Sister    Social Hx: widow; resides alone, drives herself. Attends church. Lost husband recently. He resided in a nursing facility. Lost a son in remote past due to an accident.     Review of Systems  Psychiatric/Behavioral:  The patient is nervous/anxious and has insomnia.         Objective:   Vitals: BP 130/70   Pulse 65    Ht 5' 2.5 (1.588 m)   Wt 135 lb (61.2 kg)   SpO2 99%   BMI 24.30 kg/m    Physical Exam Vitals and nursing note reviewed.     Anxious in offce today. BP is stable. Patient is frustrated and has questions about what is happening to her. Explained that this represents a withdrawal syndrome. Has been on severa;l doses daily of Xanas but recently has expressed desire to cut dosage back.  Results:   Studies obtained and personally reviewed by me:   Labs:       Component Value Date/Time   NA 138 07/03/2023 1255   NA 138 11/09/2021 0000   K 4.1 07/03/2023 1255   CL 98 07/03/2023 1255   CO2 28 07/03/2023 1255   GLUCOSE 89 07/03/2023 1255   BUN 21 07/03/2023 1255   BUN 19 11/09/2021 0000   CREATININE 1.27 (H) 07/03/2023 1255   CALCIUM 10.1 07/03/2023 1255   PROT 7.1 07/03/2023 1255   ALBUMIN 4.6 11/09/2021 0000   AST 19 07/03/2023 1255   ALT 14 07/03/2023 1255   ALKPHOS 58 11/26/2019 1000   BILITOT 0.6 07/03/2023 1255   GFRNONAA  38 (L) 07/06/2020 1229   GFRAA 45 (L) 07/06/2020 1229     Lab Results  Component Value Date   WBC 7.4 07/03/2023   HGB 12.7 07/03/2023   HCT 37.6 07/03/2023   MCV 94.2 07/03/2023   PLT 154 07/03/2023    Lab Results  Component Value Date   CHOL 210 (H) 03/03/2024   HDL 100 03/03/2024   LDLCALC 95 03/03/2024   TRIG 65 03/03/2024   CHOLHDL 2.1 03/03/2024    Lab Results  Component Value Date   HGBA1C 5.5 06/29/2014     Lab Results  Component Value Date   TSH 2.79 03/03/2024        Assessment & Plan:    Anxiety: treated with Alprazolam  0.5 mg in the morning, early after noon and 1 mg at bedtime. She has been trying to taper off of the Xanax  but she hasn't had a restful night  of sleep since. She  recently has been taking alprazolam  three times daily.Feels very anxious and is tearful in the office today. Does not understand why she feels anxious. Explained that she is sensitive to even a slight taper of Xanax  dosage. Having  discontinuation symptoms with minor dose decrease. Likely unable to tolerate taper. Should resign herself to being dependent on Xanax  for the remainder of her life. It helps her life go more smoothly when Xanax  is taken. She has been advised to return to taking it 4 times daily.  She has realized that trying to taper Xanax  is not easy. She is having some significant anxiety with decreasing by only one tablet. Lost her son a number of years ago due to an accident and most recently lost her husband. Resides alone.  Hypertension: treated with hydrochlorothiazide  25 mg daily. Blood pressure is normal at 130/70.   Depression; longstanding treated with Wellbutrin .  Hypothyroidism treated with thyroid  replacement medication. TSH was normal in December 2025.  Plan: She should return to taking Xanax  4 times daily as prescribed and resign herself that she will need this dosage indefinitely.   I,Makayla C Reid,acting as a scribe for Ronal JINNY Hailstone, MD.,have documented all relevant documentation on the behalf of Ronal JINNY Hailstone, MD,as directed by  Ronal JINNY Hailstone, MD while in the presence of Ronal JINNY Hailstone, MD.  I, Ronal JINNY Hailstone, MD, have reviewed all documentation for this visit. The documentation on 03/27/2024 for the exam, diagnosis, procedures, and orders are all accurate and complete.     30 minutes spent with patient including chart review, med review, speaking with pt, examining pt, and medcial decision making.  IRonal JINNY Hailstone, MD, have reviewed all documentation for this visit. The documentation on 03/27/2024 for the exam, diagnosis, procedures, and orders are all accurate and complete.    "

## 2024-03-29 ENCOUNTER — Ambulatory Visit (HOSPITAL_BASED_OUTPATIENT_CLINIC_OR_DEPARTMENT_OTHER)
Admission: RE | Admit: 2024-03-29 | Discharge: 2024-03-29 | Disposition: A | Discharge status: Home / Self Care | Source: Ambulatory Visit | Attending: Internal Medicine | Admitting: Internal Medicine

## 2024-03-29 ENCOUNTER — Encounter (HOSPITAL_BASED_OUTPATIENT_CLINIC_OR_DEPARTMENT_OTHER): Payer: Self-pay | Admitting: Radiology

## 2024-03-29 DIAGNOSIS — Z1231 Encounter for screening mammogram for malignant neoplasm of breast: Secondary | ICD-10-CM | POA: Insufficient documentation

## 2024-04-09 NOTE — Progress Notes (Unsigned)
 " HISTORY AND PHYSICAL     CC:  follow up. Requesting Provider:  Perri Ronal PARAS, MD  HPI: This is a 84 y.o. female here for follow up for carotid artery stenosis.  Pt is s/p right CEA with dacron patch angioplasty for asymptomatic carotid artery stenosis on 11/28/2019 by Dr. Oris.    Pt was last seen 01/11/2022 and at that time she was doing well without neurological sx.  She remained active and was on asa/statin.   Pt returns today for follow up.    Pt denies any amaurosis fugax, speech difficulties, weakness, numbness, paralysis or clumsiness or facial droop.    She denies any pain or ulcers on her feet.   The pt is on a statin for cholesterol management.  The pt is on a daily aspirin .   Other AC:  none The pt is on diuretic for hypertension.   The pt is not on medication for diabetes Tobacco hx:  never  Pt does not have family hx of AAA.  She is one of 13 children and she and her sister are the only living siblings.    Past Medical History:  Diagnosis Date   Anxiety    Arthritis    back   Cancer (HCC) 1997   melanoma rt neck   Carotid artery stenosis    Chronic kidney disease    stage renal disease   Counseling for estrogen replacement therapy    Fibrocystic breast disease    Hypertension    Hypothyroidism    Insomnia    Spinal stenosis    Vitamin D  deficiency     Past Surgical History:  Procedure Laterality Date   ABDOMINAL HYSTERECTOMY  03/1997   bso endometrial cancer   ENDARTERECTOMY Right 11/28/2019   Procedure: RIGHT CAROTID ENDARTERECTOMY;  Surgeon: Oris Krystal FALCON, MD;  Location: [facility] OR;  Service: Vascular;  Laterality: Right;   EYE SURGERY     MELANOMA EXCISION     neck   PATCH ANGIOPLASTY Right 11/28/2019   Procedure: PATCH ANGIOPLASTY RIGHT CAROTID;  Surgeon: Oris Krystal FALCON, MD;  Location: [facility] OR;  Service: Vascular;  Laterality: Right;   TUBAL LIGATION      Allergies[1]  Current Outpatient Medications  Medication Sig Dispense Refill    ALPRAZolam  (XANAX ) 0.5 MG tablet One tablet by mouth in the morning, one tablet in the early afternoon, and 2 tablets at bedtime 120 tablet 1   Ascorbic Acid (VITAMIN C) 1000 MG tablet Take 1,000 mg by mouth daily. 2x daily.     aspirin  EC 81 MG tablet Take 81 mg by mouth daily. Swallow whole.     buPROPion  (WELLBUTRIN  XL) 150 MG 24 hr tablet Take 1 tablet (150 mg total) by mouth daily. 90 tablet 1   Cholecalciferol (VITAMIN D -3) 1000 units CAPS Take 1,000 Units by mouth daily.      donepezil  (ARICEPT ) 5 MG tablet Take 1 tablet (5 mg total) by mouth at bedtime. 90 tablet 2   hydrochlorothiazide  (HYDRODIURIL ) 25 MG tablet Take 1 tablet (25 mg total) by mouth daily. 90 tablet 3   levothyroxine  (SYNTHROID ) 88 MCG tablet Take 1 tablet (88 mcg total) by mouth daily. 90 tablet 1   simvastatin  (ZOCOR ) 10 MG tablet Take 1 tablet (10 mg total) by mouth daily. 90 tablet 3   No current facility-administered medications for this visit.    Family History  Problem Relation Age of Onset   Stroke Mother    Miscarriages / Stillbirths Mother  Alcohol abuse Father    Alzheimer's disease Brother    Dementia Brother    Hearing loss Brother    Alzheimer's disease Sister    Dementia Sister    Kidney disease Sister    COPD Sister    Cancer Sister    Hypertension Sister    Hearing loss Sister     Social History   Socioeconomic History   Marital status: Married    Spouse name: Not on file   Number of children: Not on file   Years of education: Not on file   Highest education level: 12th grade  Occupational History   Not on file  Tobacco Use   Smoking status: Never   Smokeless tobacco: Never  Substance and Sexual Activity   Alcohol use: Never   Drug use: Never   Sexual activity: Not Currently  Other Topics Concern   Not on file  Social History Narrative   Not on file   Social Drivers of Health   Tobacco Use: Low Risk (03/27/2024)   Patient History    Smoking Tobacco Use: Never     Smokeless Tobacco Use: Never    Passive Exposure: Not on file  Financial Resource Strain: Low Risk (03/04/2024)   Overall Financial Resource Strain (CARDIA)    Difficulty of Paying Living Expenses: Not hard at all  Food Insecurity: No Food Insecurity (03/04/2024)   Epic    Worried About Programme Researcher, Broadcasting/film/video in the Last Year: Never true    Ran Out of Food in the Last Year: Never true  Transportation Needs: No Transportation Needs (03/04/2024)   Epic    Lack of Transportation (Medical): No    Lack of Transportation (Non-Medical): No  Physical Activity: Sufficiently Active (03/04/2024)   Exercise Vital Sign    Days of Exercise per Week: 7 days    Minutes of Exercise per Session: 30 min  Stress: Stress Concern Present (03/04/2024)   [employer] of Occupational Health - Occupational Stress Questionnaire    Feeling of Stress: To some extent  Social Connections: Moderately Integrated (03/04/2024)   Social Connection and Isolation Panel    Frequency of Communication with Friends and Family: More than three times a week    Frequency of Social Gatherings with Friends and Family: More than three times a week    Attends Religious Services: More than 4 times per year    Active Member of [employer] or Organizations: Yes    Attends Banker Meetings: More than 4 times per year    Marital Status: Widowed  Intimate Partner Violence: Not At Risk (03/04/2024)   Epic    Fear of Current or Ex-Partner: No    Emotionally Abused: No    Physically Abused: No    Sexually Abused: No  Depression (PHQ2-9): Medium Risk (03/27/2024)   Depression (PHQ2-9)    PHQ-2 Score: 8  Alcohol Screen: Low Risk (03/04/2024)   Alcohol Screen    Last Alcohol Screening Score (AUDIT): 0  Housing: Low Risk (03/04/2024)   Epic    Unable to Pay for Housing in the Last Year: No    Number of Times Moved in the Last Year: 0    Homeless in the Last Year: No  Utilities: Not At Risk (03/04/2024)   Epic    Threatened  with loss of utilities: No  Health Literacy: Adequate Health Literacy (03/04/2024)   B1300 Health Literacy    Frequency of need for help with medical instructions: Never  REVIEW OF SYSTEMS:   [X]  denotes positive finding, [ ]  denotes negative finding Cardiac  Comments:  Chest pain or chest pressure:    Shortness of breath upon exertion:    Short of breath when lying flat:    Irregular heart rhythm:        Vascular    Pain in calf, thigh, or hip brought on by ambulation:    Pain in feet at night that wakes you up from your sleep:     Blood clot in your veins:    Leg swelling:         Pulmonary    Oxygen at home:    Productive cough:     Wheezing:         Neurologic    Sudden weakness in arms or legs:     Sudden numbness in arms or legs:     Sudden onset of difficulty speaking or slurred speech:    Temporary loss of vision in one eye:     Problems with dizziness:         Gastrointestinal    Blood in stool:     Vomited blood:         Genitourinary    Burning when urinating:     Blood in urine:        Psychiatric    Major depression:         Hematologic    Bleeding problems:    Problems with blood clotting too easily:        Skin    Rashes or ulcers:        Constitutional    Fever or chills:      PHYSICAL EXAMINATION:  Today's Vitals   04/10/24 1016 04/10/24 1017  BP: (!) 160/77 (!) 167/84  Pulse: 68   Weight: 135 lb 12.8 oz (61.6 kg)   Height: 5' 2 (1.575 m)    Body mass index is 24.84 kg/m.   General:  WDWN in NAD; vital signs documented above Gait: Not observed HENT: WNL, normocephalic Pulmonary: normal non-labored breathing Cardiac: regular HR, without carotid bruits Abdomen: soft, NT; aortic pulse is not palpable Skin: without rashes Vascular Exam/Pulses: Bilateral radial pulses are palpable Extremities: without open wounds Musculoskeletal: no muscle wasting or atrophy  Neurologic: A&O X 3; moving all extremities equally; speech is  fluent/normal Psychiatric:  The pt has Normal affect.   Non-Invasive Vascular Imaging:   Carotid Duplex on 04/10/2024 Right:  1-39% ICA stenosis Left:  1-39% ICA stenosis Vertebrals:  Bilateral vertebral arteries demonstrate antegrade flow.  Subclavians: Normal flow hemodynamics were seen in bilateral subclavian arteries.   Previous Carotid duplex on 01/11/2022: Right: 1-39% ICA stenosis Left:   1-39% ICA stenosis    ASSESSMENT/PLAN:: 84 y.o. female here for follow up carotid artery stenosis and has hx of  right CEA with dacron patch angioplasty for asymptomatic carotid artery stenosis on 11/28/2019 by Dr. Oris.    -duplex today reveals bilateral 1-39% ICA stenosis -discussed s/s of stroke with pt and she understands should she develop any of these sx, she will go to the nearest ER or call 911. -pt will f/u in 2 years with carotid duplex -pt will call sooner should she have any issues. -continue statin/asa    Lucie Apt, [facility] [phone]  Clinic MD:  Lanis     [1]  Allergies Allergen Reactions   Penicillins Hives   Codeine Hives   "

## 2024-04-10 ENCOUNTER — Ambulatory Visit (HOSPITAL_COMMUNITY)
Admission: RE | Admit: 2024-04-10 | Discharge: 2024-04-10 | Disposition: A | Source: Ambulatory Visit | Attending: Surgery | Admitting: Surgery

## 2024-04-10 ENCOUNTER — Ambulatory Visit: Admitting: Physician Assistant

## 2024-04-10 VITALS — BP 167/84 | HR 68 | Ht 62.0 in | Wt 135.8 lb

## 2024-04-10 DIAGNOSIS — I6529 Occlusion and stenosis of unspecified carotid artery: Secondary | ICD-10-CM | POA: Diagnosis not present

## 2024-04-10 DIAGNOSIS — I6523 Occlusion and stenosis of bilateral carotid arteries: Secondary | ICD-10-CM

## 2024-08-25 ENCOUNTER — Other Ambulatory Visit

## 2024-08-26 ENCOUNTER — Ambulatory Visit: Admitting: Internal Medicine

## 2024-09-02 ENCOUNTER — Other Ambulatory Visit

## 2024-09-09 ENCOUNTER — Ambulatory Visit: Admitting: Internal Medicine
